# Patient Record
Sex: Female | Born: 1940
Health system: Southern US, Community
[De-identification: ages and names within clinical notes are randomized; demographics above are authoritative.]

## PROBLEM LIST (undated history)

## (undated) DIAGNOSIS — T7840XA Allergy, unspecified, initial encounter: Secondary | ICD-10-CM

## (undated) DIAGNOSIS — B019 Varicella without complication: Secondary | ICD-10-CM

## (undated) DIAGNOSIS — R32 Unspecified urinary incontinence: Secondary | ICD-10-CM

## (undated) DIAGNOSIS — E785 Hyperlipidemia, unspecified: Secondary | ICD-10-CM

## (undated) DIAGNOSIS — M1990 Unspecified osteoarthritis, unspecified site: Secondary | ICD-10-CM

## (undated) DIAGNOSIS — R112 Nausea with vomiting, unspecified: Secondary | ICD-10-CM

## (undated) DIAGNOSIS — I10 Essential (primary) hypertension: Secondary | ICD-10-CM

## (undated) DIAGNOSIS — Z9889 Other specified postprocedural states: Secondary | ICD-10-CM

## (undated) HISTORY — PX: TONSILLECTOMY AND ADENOIDECTOMY: SHX28

## (undated) HISTORY — DX: Unspecified urinary incontinence: R32

## (undated) HISTORY — PX: EYE SURGERY: SHX253

## (undated) HISTORY — PX: BREAST EXCISIONAL BIOPSY: SUR124

## (undated) HISTORY — PX: CATARACT EXTRACTION: SUR2

## (undated) HISTORY — DX: Hyperlipidemia, unspecified: E78.5

## (undated) HISTORY — DX: Unspecified osteoarthritis, unspecified site: M19.90

## (undated) HISTORY — DX: Varicella without complication: B01.9

## (undated) HISTORY — DX: Allergy, unspecified, initial encounter: T78.40XA

## (undated) HISTORY — PX: OOPHORECTOMY: SHX86

## (undated) HISTORY — DX: Essential (primary) hypertension: I10

---

## 1958-10-19 HISTORY — PX: TONSILLECTOMY AND ADENOIDECTOMY: SHX28

## 1966-10-19 HISTORY — PX: BREAST SURGERY: SHX581

## 1984-10-19 HISTORY — PX: ABDOMINAL HYSTERECTOMY: SHX81

## 2006-07-07 ENCOUNTER — Ambulatory Visit: Payer: Self-pay | Admitting: Internal Medicine

## 2006-07-07 IMAGING — US US CAROTID DUPLEX BILAT
1 series · 17 of 24 positions shown · non-contrast
Comparison: none

REASON FOR EXAM: Questionable TIA
COMMENTS:

[Series 1: us carotid duplex bilat · 17 of 62 slices shown]
[im 1/62]
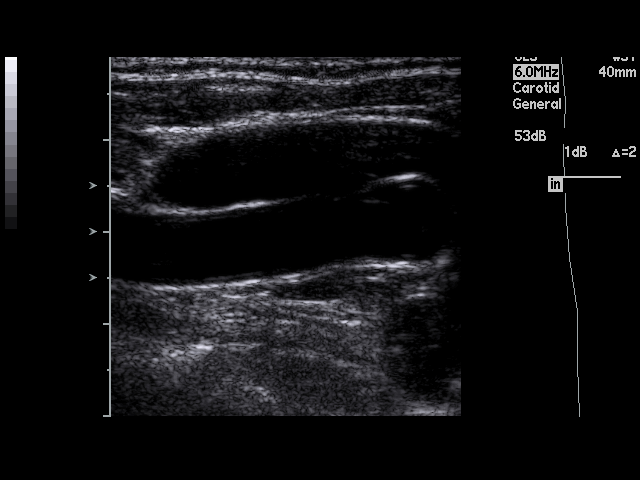
[im 6/62]
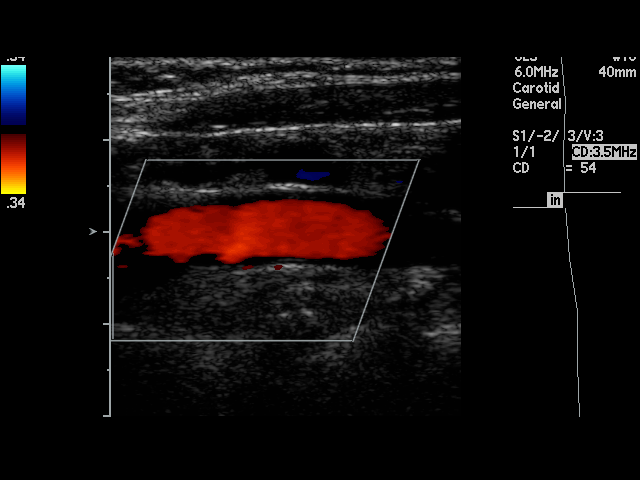
[im 8/62]
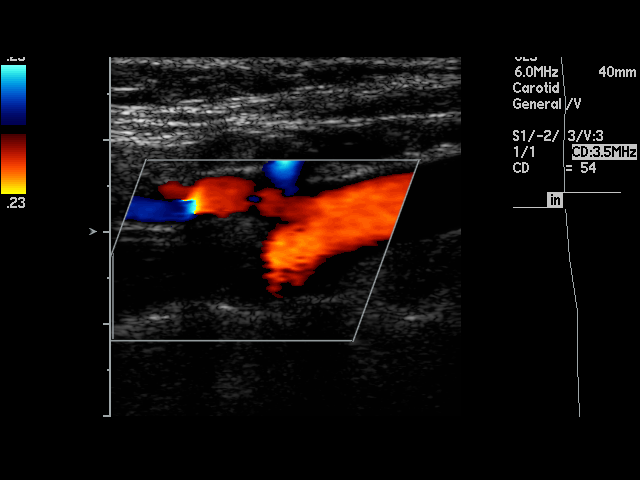
[im 11/62]
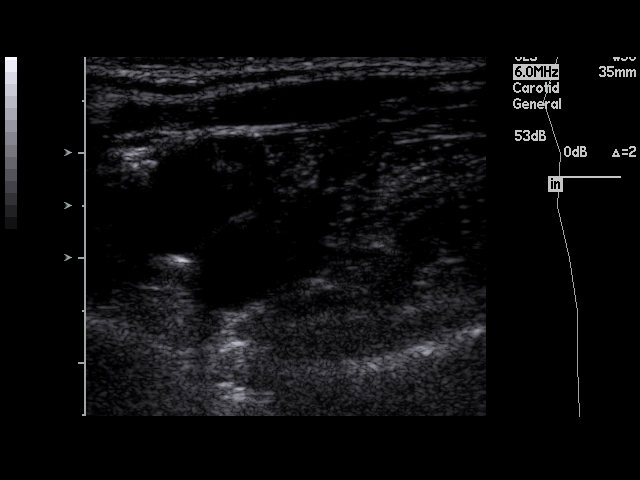
[im 16/62]
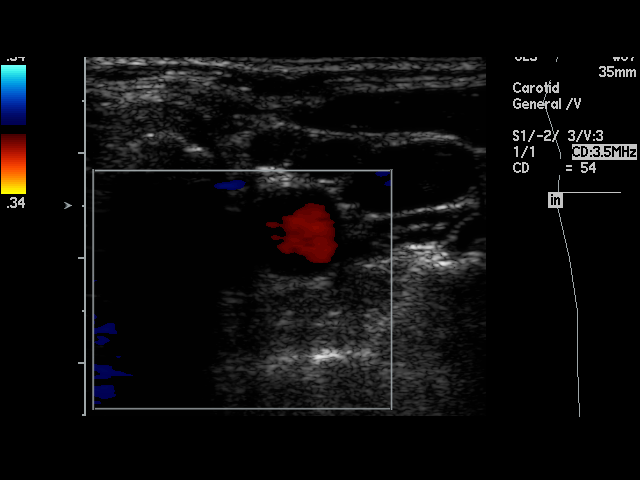
[im 19/62]
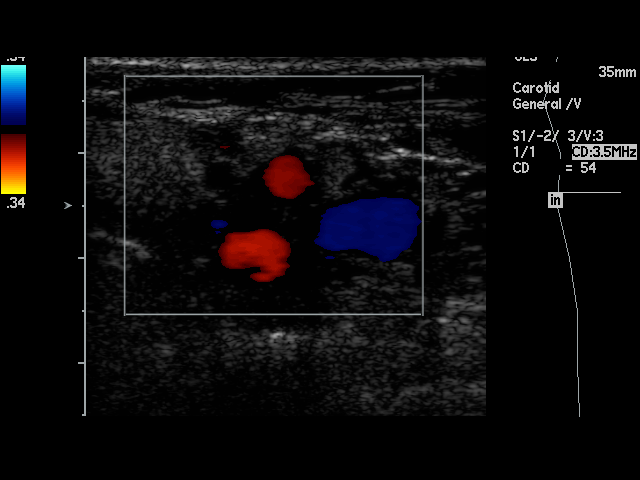
[im 24/62]
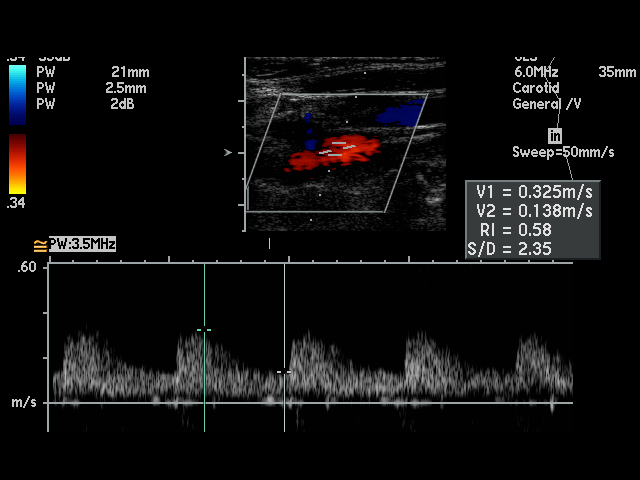
[im 27/62]
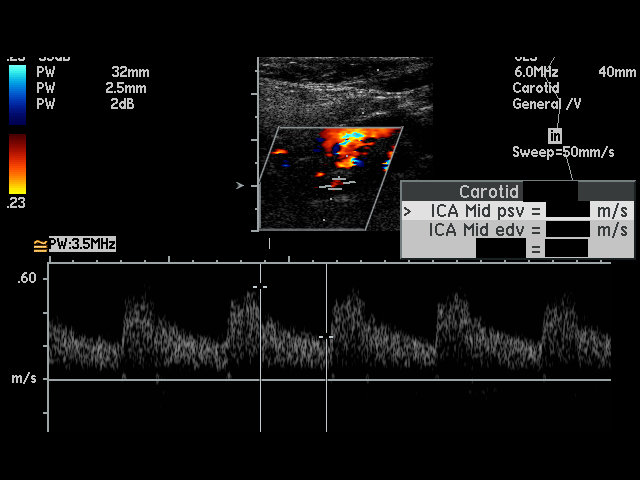
[im 32/62]
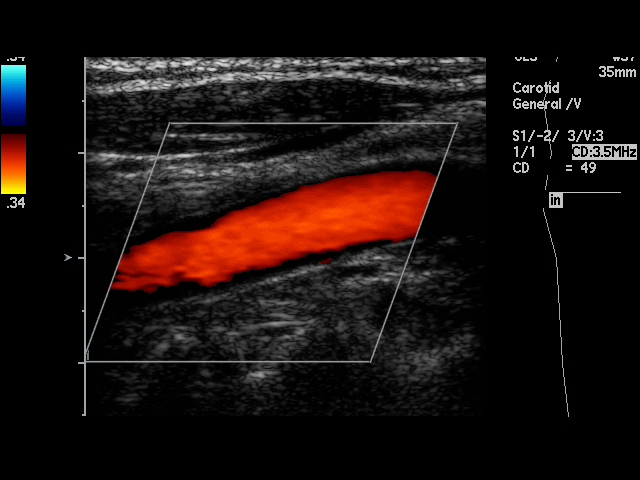
[im 35/62]
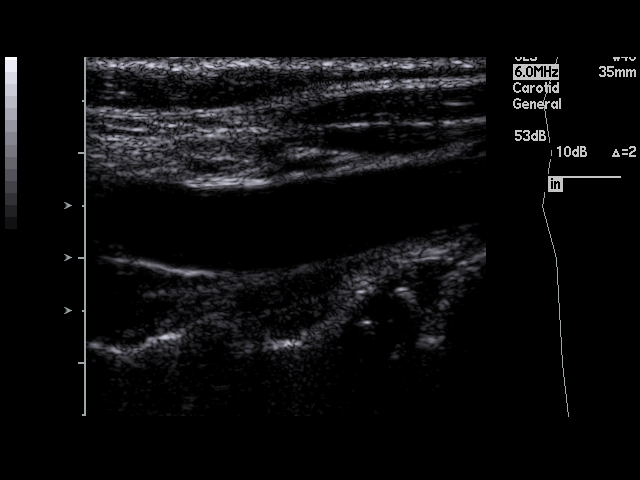
[im 38/62]
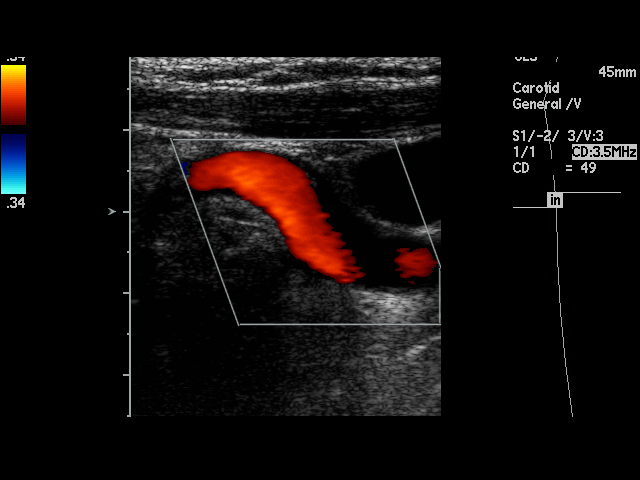
[im 43/62]
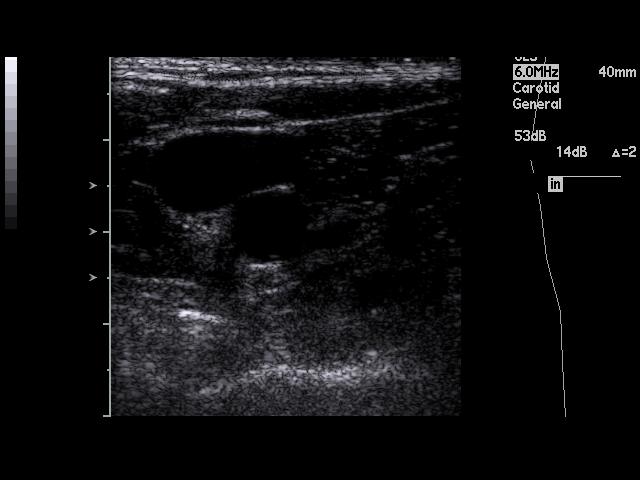
[im 46/62]
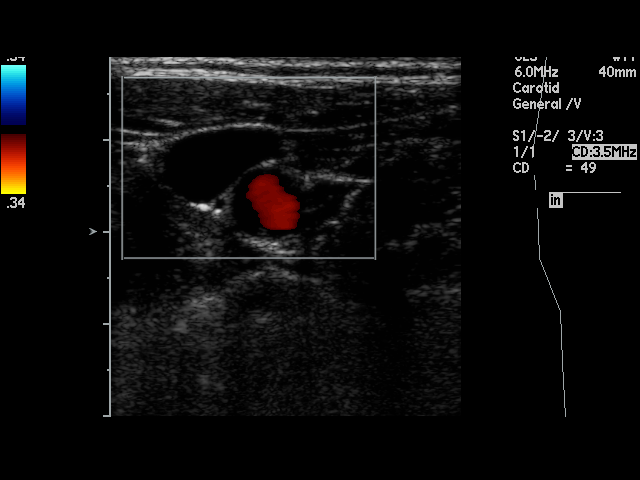
[im 51/62]
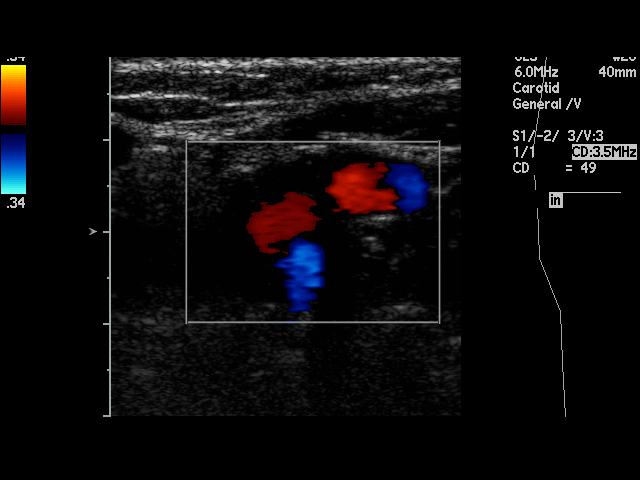
[im 54/62]
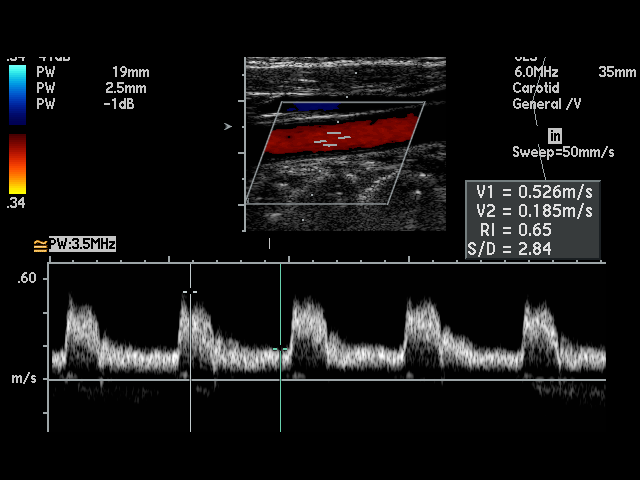
[im 56/62]
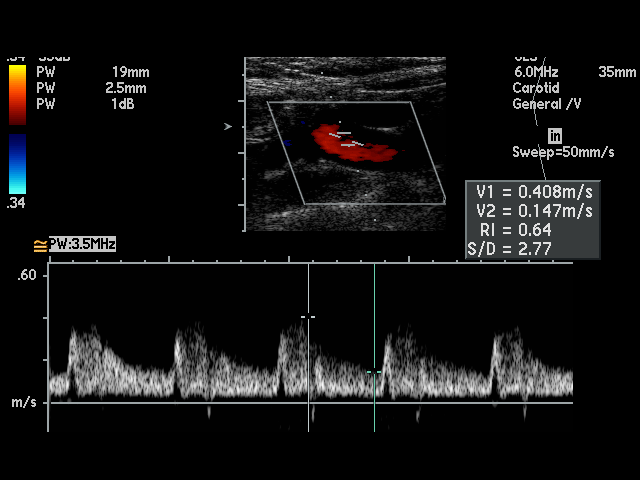
[im 62/62]
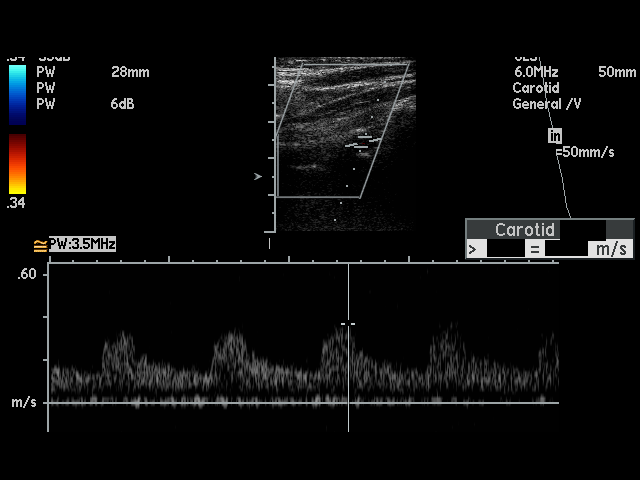

[17 of 24 positions shown; findings below may reference images not displayed]

PROCEDURE:     US  - US CAROTID DOPPLER BILATERAL  - [DATE] [DATE]

RESULT:     Gray scale, Duplex color flow and spectral waveform imaging was
performed of the RIGHT and LEFT carotid systems.

Visual evaluation of the RIGHT carotid system demonstrates no evidence of
intimal thickening, smooth or calcified plaque. The LEFT carotid system
demonstrates smooth intimal thickening of the carotid bulb demonstrating
less than 50% stenosis. The LEFT internal carotid artery demonstrates marked
tortuosity.

ICA:CCA ratios:

     RIGHT:
     LEFT:

Antegrade flow is demonstrated within the RIGHT and LEFT vertebral arteries.
Spectral waveform, color flow and Doppler evaluation is unremarkable within
the RIGHT and LEFT carotid systems.
IMPRESSION: No evidence of hemodynamically significant stenosis within
the RIGHT and LEFT carotid systems as described above.

## 2006-07-13 ENCOUNTER — Ambulatory Visit: Payer: Self-pay | Admitting: Unknown Physician Specialty

## 2006-12-21 ENCOUNTER — Ambulatory Visit: Payer: Self-pay | Admitting: Unknown Physician Specialty

## 2007-07-26 ENCOUNTER — Ambulatory Visit: Payer: Self-pay | Admitting: Unknown Physician Specialty

## 2008-07-26 ENCOUNTER — Ambulatory Visit: Payer: Self-pay | Admitting: Unknown Physician Specialty

## 2009-01-17 HISTORY — PX: FRACTURE SURGERY: SHX138

## 2009-01-31 ENCOUNTER — Emergency Department: Payer: Self-pay | Admitting: Emergency Medicine

## 2009-01-31 IMAGING — CR DG ANKLE 2V *L*
1 series · 2 of 2 positions shown · non-contrast
Comparison: none

REASON FOR EXAM: fall, pain and swelling in L ankle
COMMENTS:

[Series 1: view not recorded · 0.17mm/px · 2 of 2 slices shown]
[im 1/2]
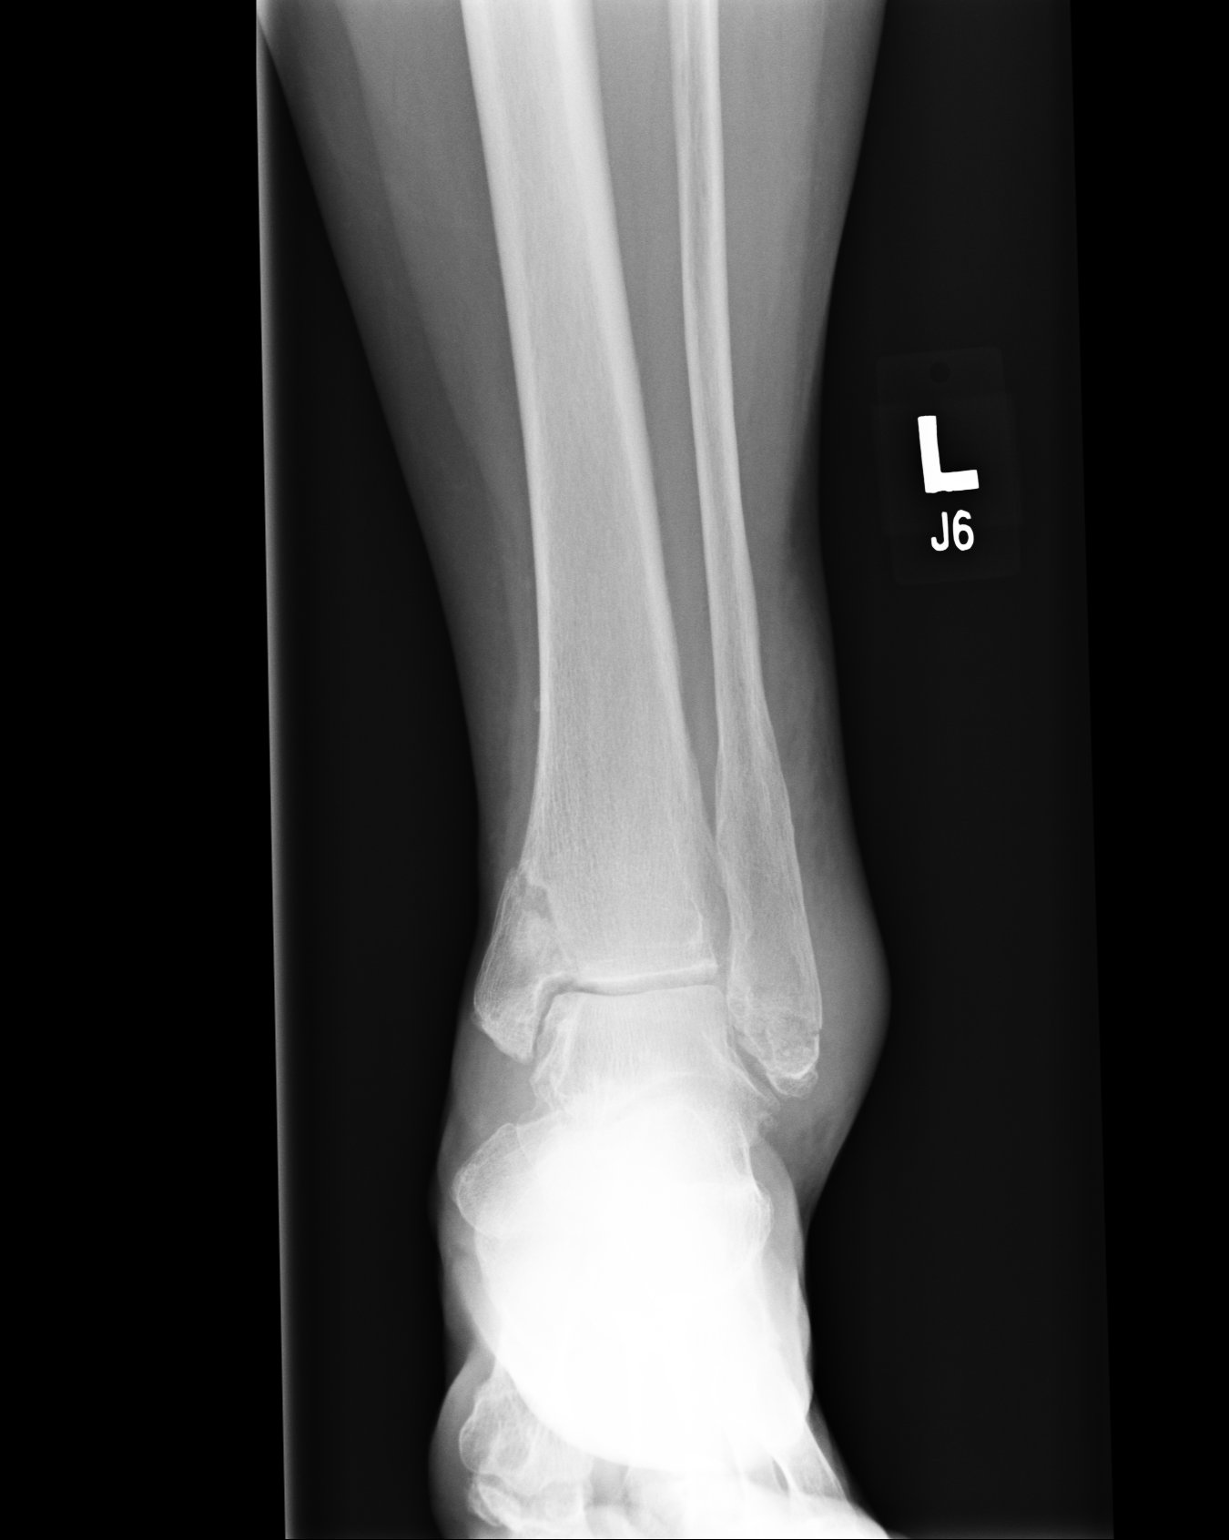
[im 2/2]
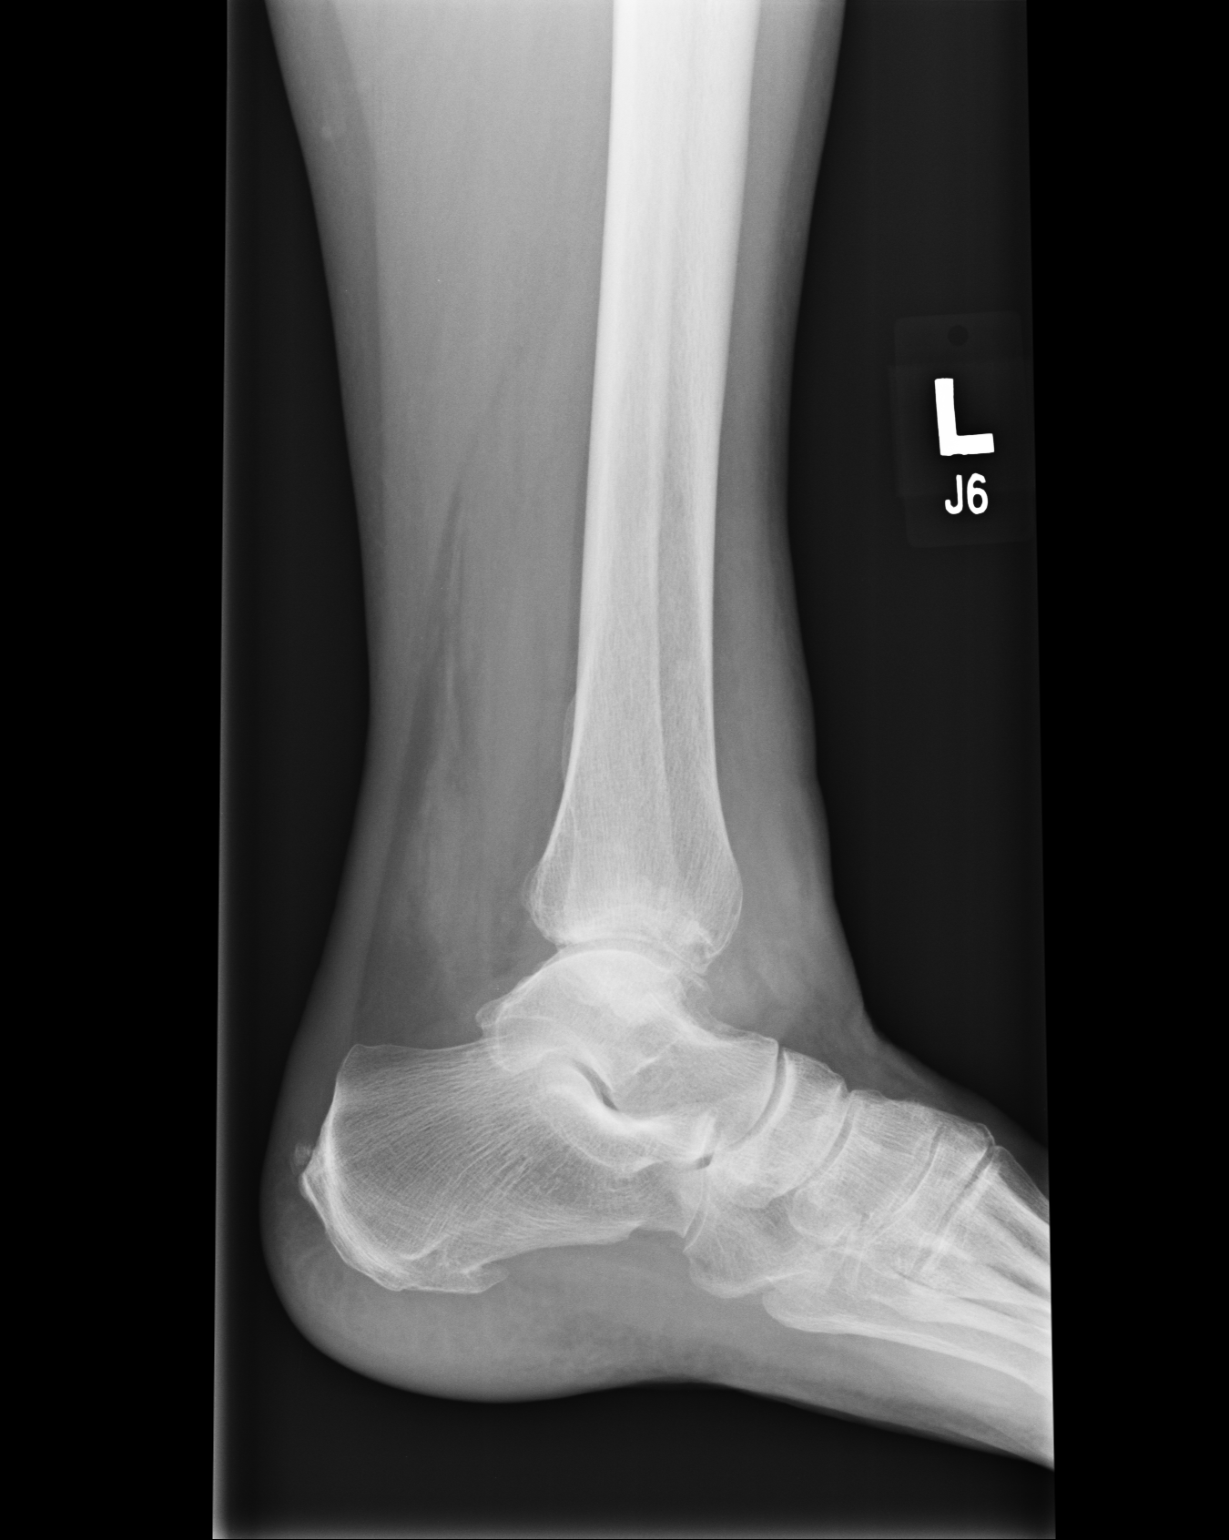

[2 of 2 positions shown; findings below may reference images not displayed]

PROCEDURE:     DXR - DXR ANKLE LEFT AP AND LATERAL  - [DATE]  [DATE]

RESULT:     There is a minimally displaced fracture of the medial malleolus.
Also noted is a transverse minimally displaced fracture of the inferior tip
of the lateral malleolus. No fracture of the posterior tibia is seen at this
time. The ankle mortise is well-maintained. There is marked soft tissue
swelling about the ankle. Incidental note is made of plantar and Achilles
calcaneal spurs.
IMPRESSION: 1.     There is a minimally displaced bimalleolar fracture as noted above.
2.     Plantar and Achilles calcaneal spurs are noted incidentally.

## 2009-02-08 ENCOUNTER — Ambulatory Visit: Payer: Self-pay | Admitting: General Practice

## 2011-01-29 ENCOUNTER — Ambulatory Visit: Payer: Self-pay | Admitting: Family Medicine

## 2012-01-25 LAB — HM COLONOSCOPY: HM Colonoscopy: NEGATIVE

## 2012-03-09 ENCOUNTER — Ambulatory Visit: Payer: Self-pay | Admitting: Unknown Physician Specialty

## 2012-03-09 LAB — HM COLONOSCOPY

## 2012-05-05 ENCOUNTER — Ambulatory Visit: Payer: Self-pay | Admitting: Family Medicine

## 2013-05-11 ENCOUNTER — Ambulatory Visit: Payer: Self-pay | Admitting: Family Medicine

## 2013-10-02 ENCOUNTER — Ambulatory Visit: Payer: Self-pay | Admitting: Ophthalmology

## 2013-10-09 ENCOUNTER — Ambulatory Visit: Payer: Self-pay | Admitting: Ophthalmology

## 2013-12-19 ENCOUNTER — Ambulatory Visit: Payer: Self-pay | Admitting: Ophthalmology

## 2014-10-24 DIAGNOSIS — N8110 Cystocele, unspecified: Secondary | ICD-10-CM | POA: Diagnosis not present

## 2014-10-24 DIAGNOSIS — N819 Female genital prolapse, unspecified: Secondary | ICD-10-CM | POA: Diagnosis not present

## 2015-02-09 NOTE — Op Note (Signed)
PATIENT NAME:  Jane Willis, Jane Willis MR#:  [id number] DATE OF BIRTH:  22-Dec-1940  DATE OF PROCEDURE:  10/09/2013  PREOPERATIVE DIAGNOSIS: Visually significant cataract of the left eye.   POSTOPERATIVE DIAGNOSIS: Visually significant cataract of the left eye.   OPERATIVE PROCEDURE: Cataract extraction by phacoemulsification with implant of intraocular lens to the left eye.   SURGEON: Birder Robson, MD.   ANESTHESIA:  1. Managed anesthesia care.  2. Topical tetracaine drops followed by 2% Xylocaine jelly applied in the preoperative holding area.   COMPLICATIONS: None.   TECHNIQUE:  Stop and chop.   DESCRIPTION OF PROCEDURE: The patient was examined and consented in the preoperative holding area where the aforementioned topical anesthesia was applied to the left eye and then brought back to the Operating Room where the left eye was prepped and draped in the usual sterile ophthalmic fashion and a lid speculum was placed. A paracentesis was created with the side port blade and the anterior chamber was filled with viscoelastic. A near clear corneal incision was performed with the steel keratome. A continuous curvilinear capsulorrhexis was performed with a cystotome followed by the capsulorrhexis forceps. Hydrodissection and hydrodelineation were carried out with BSS on a blunt cannula. The lens was removed in a stop and chop technique and the remaining cortical material was removed with the irrigation-aspiration handpiece. The capsular bag was inflated with viscoelastic and the Tecnis ZCB00 22.0-diopter lens, serial number [id number] was placed in the capsular bag without complication. The remaining viscoelastic was removed from the eye with the irrigation-aspiration handpiece. The wounds were hydrated. The anterior chamber was flushed with Miostat and the eye was inflated to physiologic pressure. 0.1 mL of cefuroxime concentration 10 mg/mL was placed in the anterior chamber. The wounds were found to be  water tight. The eye was dressed with Vigamox. The patient was given protective glasses to wear throughout the day and a shield with which to sleep tonight. The patient was also given drops with which to begin a drop regimen today and will follow-up with me in one day.   ____________________________ Livingston Diones. Aruna Nestler, MD wlp:gb D: 10/09/2013 17:02:03 ET T: 10/09/2013 23:26:53 ET JOB#: [id number]  cc: Rhylen Shaheen L. Izabella Marcantel, MD, <Dictator> Livingston Diones Kholton Coate MD ELECTRONICALLY SIGNED 10/26/2013 17:18

## 2015-02-09 NOTE — Op Note (Signed)
PATIENT NAME:  Jane Willis, Jane Willis MR#:  [id number] DATE OF BIRTH:  August 07, 1941  DATE OF PROCEDURE:  12/19/2013  PREOPERATIVE DIAGNOSIS: Visually significant cataract of the right eye.   POSTOPERATIVE DIAGNOSIS: Visually significant cataract of the right eye.   OPERATIVE PROCEDURE: Cataract extraction by phacoemulsification with implant of intraocular lens to right eye.   SURGEON: Birder Robson, MD.   ANESTHESIA:  1. Managed anesthesia care.  2. Topical tetracaine drops followed by 2% Xylocaine jelly applied in the preoperative holding area.   COMPLICATIONS: None.   TECHNIQUE:  Stop and chop.  DESCRIPTION OF PROCEDURE: The patient was examined and consented in the preoperative holding area where the aforementioned topical anesthesia was applied to the right eye and then brought back to the Operating Room where the right eye was prepped and draped in the usual sterile ophthalmic fashion and a lid speculum was placed. A paracentesis was created with the side port blade and the anterior chamber was filled with viscoelastic. A near clear corneal incision was performed with the steel keratome. A continuous curvilinear capsulorrhexis was performed with a cystotome followed by the capsulorrhexis forceps. Hydrodissection and hydrodelineation were carried out with BSS on a blunt cannula. The lens was removed in a stop and chop technique and the remaining cortical material was removed with the irrigation-aspiration handpiece. The capsular bag was inflated with viscoelastic and the Tecnis ZCB00 22.0-diopter lens, serial number [id number] was placed in the capsular bag without complication. The remaining viscoelastic was removed from the eye with the irrigation-aspiration handpiece. The wounds were hydrated. The anterior chamber was flushed with Miostat and the eye was inflated to physiologic pressure. 0.1 mL of cefuroxime concentration 10 mg/mL was placed in the anterior chamber. The wounds were found to be  water tight. The eye was dressed with Vigamox. The patient was given protective glasses to wear throughout the day and a shield with which to sleep tonight. The patient was also given drops with which to begin a drop regimen today and will follow-up with me in one day.     ____________________________ Livingston Diones. Adriona Kaney, MD wlp:ea D: 12/19/2013 20:01:56 ET T: 12/20/2013 07:16:26 ET JOB#: [id number]  cc: Leshea Jaggers L. Viren Lebeau, MD, <Dictator> Livingston Diones Mack Thurmon MD ELECTRONICALLY SIGNED 12/21/2013 9:39

## 2015-07-09 ENCOUNTER — Encounter: Payer: Self-pay | Admitting: Nurse Practitioner

## 2015-07-09 ENCOUNTER — Ambulatory Visit (INDEPENDENT_AMBULATORY_CARE_PROVIDER_SITE_OTHER): Payer: Medicare Other | Admitting: Nurse Practitioner

## 2015-07-09 VITALS — BP 116/82 | HR 57 | Temp 98.2°F | Resp 14 | Ht 63.0 in | Wt 146.8 lb

## 2015-07-09 DIAGNOSIS — Z1389 Encounter for screening for other disorder: Secondary | ICD-10-CM | POA: Diagnosis not present

## 2015-07-09 DIAGNOSIS — Z13220 Encounter for screening for lipoid disorders: Secondary | ICD-10-CM | POA: Diagnosis not present

## 2015-07-09 DIAGNOSIS — Z130 Encounter for screening for diseases of the blood and blood-forming organs and certain disorders involving the immune mechanism: Secondary | ICD-10-CM

## 2015-07-09 DIAGNOSIS — Z13820 Encounter for screening for osteoporosis: Secondary | ICD-10-CM | POA: Diagnosis not present

## 2015-07-09 DIAGNOSIS — Z7689 Persons encountering health services in other specified circumstances: Secondary | ICD-10-CM

## 2015-07-09 DIAGNOSIS — Z7189 Other specified counseling: Secondary | ICD-10-CM

## 2015-07-09 LAB — COMPREHENSIVE METABOLIC PANEL
ALK PHOS: 78 U/L (ref 39–117)
ALT: 11 U/L (ref 0–35)
AST: 13 U/L (ref 0–37)
Albumin: 4 g/dL (ref 3.5–5.2)
BUN: 16 mg/dL (ref 6–23)
CO2: 27 meq/L (ref 19–32)
Calcium: 9.4 mg/dL (ref 8.4–10.5)
Chloride: 104 mEq/L (ref 96–112)
Creatinine, Ser: 0.76 mg/dL (ref 0.40–1.20)
GFR: 78.63 mL/min (ref >60.00)
GLUCOSE: 85 mg/dL (ref 70–99)
POTASSIUM: 4.3 meq/L (ref 3.5–5.1)
Sodium: 139 mEq/L (ref 135–145)
TOTAL PROTEIN: 6.7 g/dL (ref 6.0–8.3)
Total Bilirubin: 0.5 mg/dL (ref 0.2–1.2)

## 2015-07-09 LAB — LIPID PANEL
Cholesterol: 255 mg/dL — ABNORMAL HIGH (ref 0–200)
HDL: 55 mg/dL (ref >39.00)
LDL Cholesterol: 170 mg/dL — ABNORMAL HIGH (ref 0–99)
NONHDL: 199.85
Total CHOL/HDL Ratio: 5
Triglycerides: 151 mg/dL — ABNORMAL HIGH (ref 0.0–149.0)
VLDL: 30.2 mg/dL (ref 0.0–40.0)

## 2015-07-09 LAB — CBC WITH DIFFERENTIAL/PLATELET
BASOS PCT: 0.7 % (ref 0.0–3.0)
Basophils Absolute: 0 10*3/uL (ref 0.0–0.1)
EOS ABS: 0.2 10*3/uL (ref 0.0–0.7)
Eosinophils Relative: 4.2 % (ref 0.0–5.0)
HEMATOCRIT: 40.4 % (ref 36.0–46.0)
HEMOGLOBIN: 13.5 g/dL (ref 12.0–15.0)
LYMPHS PCT: 33.4 % (ref 12.0–46.0)
Lymphs Abs: 1.8 10*3/uL (ref 0.7–4.0)
MCHC: 33.3 g/dL (ref 30.0–36.0)
MCV: 89.9 fl (ref 78.0–100.0)
MONOS PCT: 7 % (ref 3.0–12.0)
Monocytes Absolute: 0.4 10*3/uL (ref 0.1–1.0)
Neutro Abs: 2.9 10*3/uL (ref 1.4–7.7)
Neutrophils Relative %: 54.7 % (ref 43.0–77.0)
Platelets: 248 10*3/uL (ref 150.0–400.0)
RBC: 4.49 Mil/uL (ref 3.87–5.11)
RDW: 14 % (ref 11.5–15.5)
WBC: 5.3 10*3/uL (ref 4.0–10.5)

## 2015-07-09 LAB — VITAMIN D 25 HYDROXY (VIT D DEFICIENCY, FRACTURES): VITD: 33.48 ng/mL (ref 30.00–100.00)

## 2015-07-09 NOTE — Progress Notes (Signed)
Patient ID: Jane Willis, female    DOB: [birth year]  Age: 74 y.o. MRN: [id number]  CC: Establish Care   HPI MAUREEN DELATTE presents for establishing care and CC of need for labs.   1) New pt info:   Immunizations- tdap 2016, pna 2013   Mammogram- N/A  Pap- Hysterectomy 1986  Bone Density- 11/2008   Colonoscopy- 2013, Dr. Vira Agar wants to see her in 5 yr  Eye Exam- 07/2014 [facility]  Dental Exam- Implants, UTD  LMP- 1986  2) Chronic Problems-  Allergies- OTC measures helpful  Arthritis-   Hyperlipidemia-   HTN-  3) Acute Problems-  Needs labs   History Evalisse has a past medical history of Arthritis; Chicken pox; Allergy; Hyperlipidemia; Hypertension; and Urinary incontinence.   She has past surgical history that includes Breast surgery (1968); Tonsillectomy and adenoidectomy; Abdominal hysterectomy (1986); Fracture surgery (01/2009); and Cataract extraction (2014/2015).   Her family history includes Arthritis in her father, maternal grandmother, and mother; Cancer in her brother and maternal aunt; Hypertension in her father and mother.She reports that she has never smoked. She has never used smokeless tobacco. She reports that she does not drink alcohol or use illicit drugs.  No outpatient prescriptions prior to visit.   No facility-administered medications prior to visit.    ROS Review of Systems  Constitutional: Negative for fever, chills, diaphoresis and fatigue.  Respiratory: Negative for chest tightness, shortness of breath and wheezing.   Cardiovascular: Negative for chest pain, palpitations and leg swelling.  Gastrointestinal: Negative for nausea, vomiting and diarrhea.  Skin: Negative for rash.  Neurological: Negative for dizziness, weakness, numbness and headaches.  Psychiatric/Behavioral: The patient is not nervous/anxious.     Objective:  BP 116/82 mmHg  Pulse 57  Temp(Src) 98.2 F (36.8 C)  Resp 14  Ht 5\' 3"  (1.6 m)  Wt 146 lb 12.8  oz (66.588 kg)  BMI 26.01 kg/m2  SpO2 97%  Physical Exam  Constitutional: She is oriented to person, place, and time. She appears well-developed and well-nourished. No distress.  HENT:  Head: Normocephalic and atraumatic.  Right Ear: External ear normal.  Left Ear: External ear normal.  Cardiovascular: Normal rate, regular rhythm and normal heart sounds.  Exam reveals no gallop and no friction rub.   No murmur heard. Pulmonary/Chest: Effort normal and breath sounds normal. No respiratory distress. She has no wheezes. She has no rales. She exhibits no tenderness.  Neurological: She is alert and oriented to person, place, and time. No cranial nerve deficit. She exhibits normal muscle tone. Coordination normal.  Skin: Skin is warm and dry. No rash noted. She is not diaphoretic.  Psychiatric: She has a normal mood and affect. Her behavior is normal. Judgment and thought content normal.   Assessment & Plan:   Adara was seen today for establish care.  Diagnoses and all orders for this visit:  Screening for osteoporosis -     DG Bone Density; Future  I am having Ms. Limburg maintain her aspirin, vitamin C, Fish Oil, and Vitamin D-3.  Meds ordered this encounter  Medications  . aspirin 81 MG tablet    Sig: Take 81 mg by mouth daily.  . vitamin C (ASCORBIC ACID) 500 MG tablet    Sig: Take 500 mg by mouth daily.  . Omega-3 Fatty Acids (FISH OIL) 1360 MG CAPS    Sig: Take by mouth.  . Cholecalciferol (VITAMIN D-3) 1000 UNITS CAPS    Sig: Take 1,000 capsules by  mouth 2 (two) times daily.     Follow-up: No Follow-up on file.

## 2015-07-09 NOTE — Patient Instructions (Signed)
Welcome to [facility]! Nice to meet you!   Follow up in 1 year.

## 2015-07-12 DIAGNOSIS — Z130 Encounter for screening for diseases of the blood and blood-forming organs and certain disorders involving the immune mechanism: Secondary | ICD-10-CM | POA: Insufficient documentation

## 2015-07-12 DIAGNOSIS — Z13820 Encounter for screening for osteoporosis: Secondary | ICD-10-CM | POA: Insufficient documentation

## 2015-07-12 DIAGNOSIS — Z13220 Encounter for screening for lipoid disorders: Secondary | ICD-10-CM | POA: Insufficient documentation

## 2015-07-12 DIAGNOSIS — Z7689 Persons encountering health services in other specified circumstances: Secondary | ICD-10-CM | POA: Insufficient documentation

## 2015-07-12 DIAGNOSIS — Z1389 Encounter for screening for other disorder: Secondary | ICD-10-CM | POA: Insufficient documentation

## 2015-07-12 NOTE — Assessment & Plan Note (Signed)
Discussed acute and chronic issues. Reviewed health maintenance measures, PFSHx, and immunizations. Obtain records from previous facility.   Lipid panel, CMET, and CBC w/ diff ordered

## 2015-07-12 NOTE — Assessment & Plan Note (Signed)
Bone density placed to screen for osteoporosis and Vitamin D level.

## 2015-12-12 ENCOUNTER — Ambulatory Visit: Payer: Medicare Other | Attending: Nurse Practitioner

## 2016-02-19 DIAGNOSIS — S0101XA Laceration without foreign body of scalp, initial encounter: Secondary | ICD-10-CM | POA: Diagnosis not present

## 2016-02-21 ENCOUNTER — Encounter: Payer: Self-pay | Admitting: Family Medicine

## 2016-02-21 ENCOUNTER — Ambulatory Visit (INDEPENDENT_AMBULATORY_CARE_PROVIDER_SITE_OTHER): Payer: Medicare Other | Admitting: Family Medicine

## 2016-02-21 ENCOUNTER — Encounter (INDEPENDENT_AMBULATORY_CARE_PROVIDER_SITE_OTHER): Payer: Self-pay

## 2016-02-21 VITALS — BP 136/88 | HR 65 | Temp 98.3°F | Ht 63.0 in | Wt 147.0 lb

## 2016-02-21 DIAGNOSIS — S0101XA Laceration without foreign body of scalp, initial encounter: Secondary | ICD-10-CM | POA: Diagnosis not present

## 2016-02-21 NOTE — Progress Notes (Signed)
Patient ID: CHEYANN GIPP, female   DOB: 12-Jan-1941, 75 y.o.   MRN: [id number]  [name], MD Phone: [phone]  CAMYAH DERDEN is a 75 y.o. female who presents today for follow-up.  Patient notes on Wednesday she was unloading a lawnmower from a truck and slipped and wet grass and fell on her bottom. She notes she her head hit a basketball goalpost. She had some bleeding from her head. She had no loss of consciousness. She was evaluated at an urgent care and had 5 staples placed in her head. She's not had any headaches. No numbness or weakness or vision changes. No dizziness. She feels well overall. She's not had any discomfort in her back or bottom. She's not on any anticoagulation. She is up-to-date on her tetanus shot. She is to go to the urgent care next Wednesday for staple removal.  ROS see history of present illness  Objective  Physical Exam Filed Vitals:   02/21/16 1104  BP: 136/88  Pulse: 65  Temp: 98.3 F (36.8 C)    BP Readings from Last 3 Encounters:  02/21/16 136/88  07/09/15 116/82   Wt Readings from Last 3 Encounters:  02/21/16 147 lb (66.679 kg)  07/09/15 146 lb 12.8 oz (66.588 kg)    Physical Exam  Constitutional: She is well-developed, well-nourished, and in no distress.  HENT:  Head: Normocephalic.    Right Ear: External ear normal.  Left Ear: External ear normal.  Mouth/Throat: Oropharynx is clear and moist. No oropharyngeal exudate.  Eyes: Conjunctivae are normal. Pupils are equal, round, and reactive to light.  Neck: Neck supple.  Cardiovascular: Normal rate, regular rhythm and normal heart sounds.   Pulmonary/Chest: Effort normal and breath sounds normal.  Musculoskeletal: She exhibits no edema.  No midline spine tenderness, no midline spine step-off, no muscular back tenderness, no hip tenderness bilaterally  Lymphadenopathy:    She has no cervical adenopathy.  Neurological: She is alert.  CN 2-12 intact, 5/5 strength in bilateral  biceps, triceps, grip, quads, hamstrings, plantar and dorsiflexion, sensation to light touch intact in bilateral UE and LE, normal gait, 2+ patellar reflexes  Skin: Skin is warm and dry.     Assessment/Plan: Please see individual problem list.  Scalp laceration Patient status post mechanical fall slipping on grass. Suffered a laceration to her scalp. Was evaluated at urgent care. Had staples placed. No loss of consciousness. She is neurologically intact. Laceration appears to be well-healing. She will continue to monitor. She will return to urgent care for staple removal. She's given return precautions.    [name], MD [place]

## 2016-02-21 NOTE — Progress Notes (Signed)
Pre visit review using our clinic review tool, if applicable. No additional management support is needed unless otherwise documented below in the visit note. 

## 2016-02-21 NOTE — Assessment & Plan Note (Signed)
Patient status post mechanical fall slipping on grass. Suffered a laceration to her scalp. Was evaluated at urgent care. Had staples placed. No loss of consciousness. She is neurologically intact. Laceration appears to be well-healing. She will continue to monitor. She will return to urgent care for staple removal. She's given return precautions.

## 2016-02-21 NOTE — Patient Instructions (Signed)
Nice to meet you. Please continue to monitor your head and follow-up with [facility] to have her staples removed next week. Please schedule an appointment for a physical at her convenience. If you develop headaches, vision changes, numbness, weakness, bleeding, or any new or changing symptoms please seek medical attention.

## 2016-04-02 ENCOUNTER — Ambulatory Visit (INDEPENDENT_AMBULATORY_CARE_PROVIDER_SITE_OTHER): Payer: Medicare Other | Admitting: Family Medicine

## 2016-04-02 ENCOUNTER — Encounter: Payer: Self-pay | Admitting: Family Medicine

## 2016-04-02 VITALS — BP 126/78 | HR 66 | Temp 98.4°F | Ht 63.0 in | Wt 144.2 lb

## 2016-04-02 DIAGNOSIS — R32 Unspecified urinary incontinence: Secondary | ICD-10-CM | POA: Diagnosis not present

## 2016-04-02 DIAGNOSIS — E663 Overweight: Secondary | ICD-10-CM

## 2016-04-02 DIAGNOSIS — Z13220 Encounter for screening for lipoid disorders: Secondary | ICD-10-CM

## 2016-04-02 DIAGNOSIS — Z0001 Encounter for general adult medical examination with abnormal findings: Secondary | ICD-10-CM | POA: Diagnosis not present

## 2016-04-02 DIAGNOSIS — R200 Anesthesia of skin: Secondary | ICD-10-CM | POA: Insufficient documentation

## 2016-04-02 DIAGNOSIS — R208 Other disturbances of skin sensation: Secondary | ICD-10-CM

## 2016-04-02 LAB — COMPREHENSIVE METABOLIC PANEL
ALBUMIN: 3.9 g/dL (ref 3.5–5.2)
ALK PHOS: 73 U/L (ref 39–117)
ALT: 10 U/L (ref 0–35)
AST: 16 U/L (ref 0–37)
BUN: 11 mg/dL (ref 6–23)
CALCIUM: 9.3 mg/dL (ref 8.4–10.5)
CO2: 29 mEq/L (ref 19–32)
Chloride: 104 mEq/L (ref 96–112)
Creatinine, Ser: 0.76 mg/dL (ref 0.40–1.20)
GFR: 78.47 mL/min (ref >60.00)
GLUCOSE: 96 mg/dL (ref 70–99)
Potassium: 4 mEq/L (ref 3.5–5.1)
Sodium: 137 mEq/L (ref 135–145)
Total Bilirubin: 0.5 mg/dL (ref 0.2–1.2)
Total Protein: 6.9 g/dL (ref 6.0–8.3)

## 2016-04-02 LAB — LIPID PANEL
CHOLESTEROL: 259 mg/dL — AB (ref 0–200)
HDL: 55.9 mg/dL (ref >39.00)
LDL Cholesterol: 180 mg/dL — ABNORMAL HIGH (ref 0–99)
NonHDL: 203.53
TRIGLYCERIDES: 120 mg/dL (ref 0.0–149.0)
Total CHOL/HDL Ratio: 5
VLDL: 24 mg/dL (ref 0.0–40.0)

## 2016-04-02 LAB — TSH: TSH: 1.33 u[IU]/mL (ref 0.35–4.50)

## 2016-04-02 LAB — HEMOGLOBIN A1C: Hgb A1c MFr Bld: 5.7 % (ref 4.6–6.5)

## 2016-04-02 LAB — VITAMIN B12: VITAMIN B 12: 196 pg/mL — AB (ref 211–911)

## 2016-04-02 NOTE — Progress Notes (Signed)
Pre visit review using our clinic review tool, if applicable. No additional management support is needed unless otherwise documented below in the visit note. 

## 2016-04-02 NOTE — Assessment & Plan Note (Signed)
Related to bladder prolapse. Wears a pessary with good results. Followed by gynecology.

## 2016-04-02 NOTE — Assessment & Plan Note (Addendum)
Overall doing well. BMI is very slightly in the overweight category. Diet is quite good. Encouraged to continue this. Encouraged exercise. Discussed screening tests. She declined DEXA scan. She declined mammogram. We will discuss colon cancer screening at her yearly follow-up next year. No need for Pap smear given prior hysterectomy for benign reasons. Lab work as outlined below.

## 2016-04-02 NOTE — Assessment & Plan Note (Signed)
Bilateral finger numbness for the last year or so. Otherwise neurologically intact. Could be neuropathy. Doubt central lesion given bilateral nature and limited fingertips. Discussed workup with lab work to start and then potentially seeing neurology. We'll order lab work as outlined below. She'll continue to monitor. Given return precautions.

## 2016-04-02 NOTE — Patient Instructions (Addendum)
Nice to see you. You're doing great job with her diet. Please start to exercise by walking 20-30 minutes 5 days a week as tolerated. Continue to monitor your fingertips. We will check some lab work to evaluate for a potential cause. We will request records from her prior physician's office to see what his pneumonia vaccine you've gotten. If you develop numbness, weakness, vision changes, or new or changing symptoms please seek medical attention.

## 2016-04-02 NOTE — Progress Notes (Signed)
Patient ID: Jane Willis, female   DOB: 02/28/1941, 75 y.o.   MRN: [id number]  Jane Rumps, MD Phone: [phone]  Jane Willis is a 75 y.o. female who presents today for physical exam.  Patient notes overall she is doing quite well. She does note some numbness in her bilateral fingertips. It has been going on for at least the past year. No numbness elsewhere. No weakness. No vision changes. Notes it is mildly tingly. Doesn't quite have the dexterity she used 2 given this. It is not absent of sensation though is decreased.  Diet is described as very good. Only one yogurt and fruit of breakfast. Crackers for lunch. Dinner includes vegetable and meat. Drinks unsweet tea. No soda. Does not exercise. No recent DEXA scan. Declines this. Mammogram several years ago. Declines further mammography. She will be due for colonoscopy next year. Reports she is unsure if she would want to do this or she would like to do stool cards. Had a hysterectomy for noncancerous reasons. No recent Pap smears. Tetanus is up-to-date. Zostavax is up-to-date. Unsure if pneumonia vaccine is up-to-date as we do not have the type of vaccine she most recently received. No alcohol use. No tobacco use. No illicit drug use.  Active Ambulatory Problems    Diagnosis Date Noted  . Screening for osteoporosis 07/12/2015  . Screening for lipid disorders 07/12/2015  . Screening for nephropathy 07/12/2015  . Screening for deficiency anemia 07/12/2015  . Encounter to establish care 07/12/2015  . Scalp laceration 02/21/2016  . Encounter for general adult medical examination with abnormal findings 04/02/2016  . Bilateral finger numbness 04/02/2016  . Urinary incontinence 04/02/2016   Resolved Ambulatory Problems    Diagnosis Date Noted  . No Resolved Ambulatory Problems   Past Medical History  Diagnosis Date  . Arthritis   . Chicken pox   . Allergy   . Hyperlipidemia   . Hypertension     Family History    Problem Relation Age of Onset  . Cancer Brother     Colon  . Cancer Maternal Aunt     Breast Cancer  . Arthritis Mother   . Hypertension Mother   . Arthritis Father   . Hypertension Father   . Arthritis Maternal Grandmother     Social History   Social History  . Marital Status: Married    Spouse Name: N/A  . Number of Children: N/A  . Years of Education: N/A   Occupational History  . Not on file.   Social History Main Topics  . Smoking status: Never Smoker   . Smokeless tobacco: Never Used  . Alcohol Use: No  . Drug Use: No  . Sexual Activity: No   Other Topics Concern  . Not on file   Social History Narrative   Married   Retired   Children 3   Pets: none   Caffeine- Coffee, Tea, rare soda    ROS  General:  Negative for nexplained weight loss, fever Skin: Negative for new or changing mole, sore that won't heal HEENT: Negative for trouble hearing, trouble seeing, ringing in ears, mouth sores, hoarseness, change in voice, dysphagia. CV:  Negative for chest pain, dyspnea, edema, palpitations Resp: Negative for cough, dyspnea, hemoptysis GI: Negative for nausea, vomiting, diarrhea, constipation, abdominal pain, melena, hematochezia. GU: Positive for incontinence (followed by gynecology and wears a pessary. Notes the pessary is significantly beneficial.) Negative for dysuria, urinary hesitance, hematuria, vaginal or penile discharge, polyuria, sexual difficulty, lumps in testicle  or breasts MSK: Negative for muscle cramps or aches, joint pain or swelling Neuro: Positive for numbness in fingertips, Negative for headaches, weakness, dizziness, passing out/fainting Psych: Negative for depression, anxiety, memory problems  Objective  Physical Exam Filed Vitals:   04/02/16 0842  BP: 126/78  Pulse: 66  Temp: 98.4 F (36.9 C)    BP Readings from Last 3 Encounters:  04/02/16 126/78  02/21/16 136/88  07/09/15 116/82   Wt Readings from Last 3 Encounters:   04/02/16 144 lb 3.2 oz (65.409 kg)  02/21/16 147 lb (66.679 kg)  07/09/15 146 lb 12.8 oz (66.588 kg)    Physical Exam  Constitutional: She is well-developed, well-nourished, and in no distress.  HENT:  Head: Normocephalic and atraumatic.  Right Ear: External ear normal.  Left Ear: External ear normal.  Mouth/Throat: Oropharynx is clear and moist. No oropharyngeal exudate.  Eyes: Conjunctivae are normal. Pupils are equal, round, and reactive to light.  Neck: Neck supple.  Cardiovascular: Normal rate, regular rhythm and normal heart sounds.   Pulmonary/Chest: Effort normal and breath sounds normal.  Bilateral breasts with no skin changes or lesions palpated  Abdominal: Soft. Bowel sounds are normal. She exhibits no distension. There is no tenderness. There is no rebound and no guarding.  Musculoskeletal: She exhibits no edema.  Lymphadenopathy:    She has no cervical adenopathy.  Neurological: She is alert.  CN 2-12 intact, 5/5 strength in bilateral biceps, triceps, grip, quads, hamstrings, plantar and dorsiflexion, mild decreased sensation light touch in bilateral fingertips, otherwise sensation to light touch intact in bilateral UE and LE, normal gait, 2+ patellar reflexes  Skin: Skin is warm and dry. She is not diaphoretic.  Psychiatric: Mood and affect normal.     Assessment/Plan:   Encounter for general adult medical examination with abnormal findings Overall doing well. BMI is very slightly in the overweight category. Diet is quite good. Encouraged to continue this. Encouraged exercise. Discussed screening tests. She declined DEXA scan. She declined mammogram. We will discuss colon cancer screening at her yearly follow-up next year. No need for Pap smear given prior hysterectomy for benign reasons. Lab work as outlined below.  Bilateral finger numbness Bilateral finger numbness for the last year or so. Otherwise neurologically intact. Could be neuropathy. Doubt central lesion  given bilateral nature and limited fingertips. Discussed workup with lab work to start and then potentially seeing neurology. We'll order lab work as outlined below. She'll continue to monitor. Given return precautions.  Urinary incontinence Related to bladder prolapse. Wears a pessary with good results. Followed by gynecology.    Orders Placed This Encounter  Procedures  . Lipid Profile  . Comp Met (CMET)  . HgB A1c  . B12  . TSH    Jane Rumps, MD [place]

## 2016-07-03 ENCOUNTER — Ambulatory Visit (INDEPENDENT_AMBULATORY_CARE_PROVIDER_SITE_OTHER): Payer: Medicare Other | Admitting: Family Medicine

## 2016-07-03 ENCOUNTER — Encounter: Payer: Self-pay | Admitting: Family Medicine

## 2016-07-03 VITALS — BP 126/84 | HR 68 | Temp 97.9°F | Ht 63.0 in | Wt 143.0 lb

## 2016-07-03 DIAGNOSIS — Z13820 Encounter for screening for osteoporosis: Secondary | ICD-10-CM | POA: Diagnosis not present

## 2016-07-03 DIAGNOSIS — Z8781 Personal history of (healed) traumatic fracture: Secondary | ICD-10-CM

## 2016-07-03 DIAGNOSIS — E785 Hyperlipidemia, unspecified: Secondary | ICD-10-CM

## 2016-07-03 DIAGNOSIS — J069 Acute upper respiratory infection, unspecified: Secondary | ICD-10-CM | POA: Diagnosis not present

## 2016-07-03 NOTE — Patient Instructions (Signed)
Nice to see you. Please start monitoring your diet and make some changes based on the form that has been provided. We will get you set up for a bone density scan and call you with the results. Please continue stay active. If your cold symptoms returned please let us know.

## 2016-07-03 NOTE — Assessment & Plan Note (Signed)
Patient refuses medicine at this time. I discussed increasing exercise. Also gave information on cholesterol diet. She'll continue fish oil and baby aspirin.

## 2016-07-03 NOTE — Progress Notes (Signed)
  [name], MD Phone: [phone]  Jane Willis is a 75 y.o. female who presents today for follow-up.  HYPERLIPIDEMIA Symptoms Chest pain on exertion:  No   Leg claudication:   No Not currently taking any medications. Has not made any diet changes. She's not increased her exercise though she does stay active throughout the day. Using fish oil and taking a baby aspirin.  History of low impact fracture: Patient notes previously having osteoporosis screening in 2010. Notes she was advised that everything was good. Several months after this she had a low impact fracture of her ankle though has not had any ankle issues since then. She reports only twisted her ankle and ended up with a plate and several screws. Takes vitamin D though no calcium. No DEXA scan since then.  Patient also notes she is just getting over a cold. Notes mild dry cough now. Was congested and had some soreness with a cough though no chest pain or shortness of breath. Notes her nose is now clear. Feels like she is improving.  PMH: nonsmoker.   ROS see history of present illness  Objective  Physical Exam Vitals:   07/03/16 0838  BP: 126/84  Pulse: 68  Temp: 97.9 F (36.6 C)    BP Readings from Last 3 Encounters:  07/03/16 126/84  04/02/16 126/78  02/21/16 136/88   Wt Readings from Last 3 Encounters:  07/03/16 143 lb (64.9 kg)  04/02/16 144 lb 3.2 oz (65.4 kg)  02/21/16 147 lb (66.7 kg)    Physical Exam  Constitutional: No distress.  HENT:  Head: Normocephalic and atraumatic.  Mouth/Throat: No oropharyngeal exudate.  Normal TMs bilaterally, mild erythema of the posterior oropharynx  Eyes: Conjunctivae are normal. Pupils are equal, round, and reactive to light.  Cardiovascular: Normal rate, regular rhythm and normal heart sounds.   2+ DP pulses  Pulmonary/Chest: Effort normal and breath sounds normal.  Musculoskeletal: She exhibits no edema.  Neurological: She is alert. Gait normal.    Skin: Skin is warm and dry. She is not diaphoretic.     Assessment/Plan: Please see individual problem list.  Hyperlipidemia Patient refuses medicine at this time. I discussed increasing exercise. Also gave information on cholesterol diet. She'll continue fish oil and baby aspirin.  History of low impact fracture of ankle Patient with a history of low impact fracture of her left ankle. No recent DEXA scan. Discussed that this would be concerning for osteoporosis. Has been many years since this occurred. She'll continue vitamin D. I encouraged calcium intake as well. We'll check a DEXA scan.  URI (upper respiratory infection) Patient with recent upper respiratory infection symptoms. Likely viral as she has improved at this time. Mild cough. Discussed that the cough could persist for weeks. She'll monitor for recurrence.   Orders Placed This Encounter  Procedures  . DG Bone Density    Standing Status:   Future    Standing Expiration Date:   09/02/2017    Order Specific Question:   Reason for Exam (SYMPTOM  OR DIAGNOSIS REQUIRED)    Answer:   osteoporosis screening    Order Specific Question:   Preferred imaging location?    Answer:   [facility]    [name], MD [place]

## 2016-07-03 NOTE — Assessment & Plan Note (Signed)
Patient with recent upper respiratory infection symptoms. Likely viral as she has improved at this time. Mild cough. Discussed that the cough could persist for weeks. She'll monitor for recurrence.

## 2016-07-03 NOTE — Assessment & Plan Note (Signed)
Patient with a history of low impact fracture of her left ankle. No recent DEXA scan. Discussed that this would be concerning for osteoporosis. Has been many years since this occurred. She'll continue vitamin D. I encouraged calcium intake as well. We'll check a DEXA scan.

## 2016-07-03 NOTE — Progress Notes (Signed)
Pre visit review using our clinic review tool, if applicable. No additional management support is needed unless otherwise documented below in the visit note. 

## 2016-07-23 ENCOUNTER — Encounter: Payer: Self-pay | Admitting: Family Medicine

## 2016-08-01 ENCOUNTER — Ambulatory Visit (INDEPENDENT_AMBULATORY_CARE_PROVIDER_SITE_OTHER): Payer: Medicare Other

## 2016-08-01 DIAGNOSIS — Z23 Encounter for immunization: Secondary | ICD-10-CM | POA: Diagnosis not present

## 2016-08-11 DIAGNOSIS — Z4689 Encounter for fitting and adjustment of other specified devices: Secondary | ICD-10-CM | POA: Diagnosis not present

## 2016-08-11 DIAGNOSIS — N816 Rectocele: Secondary | ICD-10-CM | POA: Diagnosis not present

## 2016-08-11 DIAGNOSIS — N8110 Cystocele, unspecified: Secondary | ICD-10-CM | POA: Diagnosis not present

## 2016-08-11 DIAGNOSIS — R3911 Hesitancy of micturition: Secondary | ICD-10-CM | POA: Diagnosis not present

## 2016-08-24 ENCOUNTER — Ambulatory Visit
Admission: RE | Admit: 2016-08-24 | Discharge: 2016-08-24 | Disposition: A | Discharge status: Home / Self Care | Payer: Medicare Other | Source: Ambulatory Visit | Attending: Family Medicine | Admitting: Family Medicine

## 2016-08-24 DIAGNOSIS — M85851 Other specified disorders of bone density and structure, right thigh: Secondary | ICD-10-CM | POA: Insufficient documentation

## 2016-08-24 DIAGNOSIS — Z13820 Encounter for screening for osteoporosis: Secondary | ICD-10-CM

## 2016-09-14 ENCOUNTER — Ambulatory Visit (INDEPENDENT_AMBULATORY_CARE_PROVIDER_SITE_OTHER): Payer: Medicare Other | Admitting: Family Medicine

## 2016-09-14 ENCOUNTER — Encounter: Payer: Self-pay | Admitting: Family Medicine

## 2016-09-14 VITALS — BP 136/82 | HR 62 | Temp 97.6°F | Wt 145.8 lb

## 2016-09-14 DIAGNOSIS — M8580 Other specified disorders of bone density and structure, unspecified site: Secondary | ICD-10-CM

## 2016-09-14 DIAGNOSIS — E785 Hyperlipidemia, unspecified: Secondary | ICD-10-CM | POA: Diagnosis not present

## 2016-09-14 MED ORDER — DENOSUMAB 60 MG/ML ~~LOC~~ SOLN: 60.0000 mg | Freq: Once | SUBCUTANEOUS | Status: DC

## 2016-09-14 NOTE — Progress Notes (Signed)
Pre visit review using our clinic review tool, if applicable. No additional management support is needed unless otherwise documented below in the visit note. 

## 2016-09-14 NOTE — Progress Notes (Signed)
  [name], MD Phone: [phone]  Jane Willis is a 75 y.o. female who presents today for follow-up.  Osteopenia: Patient had a recent bone density test that revealed a T score of -2.2. Had a 10 year major fracture risk of 23.1% and 10 year risk of hip fracture of 6.5%. This meets criteria for treatment for osteoporosis. She does report a strong family history of reflux and esophageal issues though she has no reflux symptoms. She does take calcium and vitamin D.  HYPERLIPIDEMIA Symptoms Chest pain on exertion:  No   Leg claudication:   No Takes a Fish oil and aspirin. Diligently works on her diet with olive oil, apples, and oatmeal daily. Does not exercise very much at this time.   PMH: nonsmoker.   ROS see history of present illness  Objective  Physical Exam Vitals:   09/14/16 0909  BP: 136/82  Pulse: 62  Temp: 97.6 F (36.4 C)    BP Readings from Last 3 Encounters:  09/14/16 136/82  07/03/16 126/84  04/02/16 126/78   Wt Readings from Last 3 Encounters:  09/14/16 145 lb 12.8 oz (66.1 kg)  07/03/16 143 lb (64.9 kg)  04/02/16 144 lb 3.2 oz (65.4 kg)    Physical Exam  Constitutional: She is well-developed, well-nourished, and in no distress.  Cardiovascular: Normal rate, regular rhythm and normal heart sounds.   Pulmonary/Chest: Effort normal and breath sounds normal.  Musculoskeletal: She exhibits no edema.  Neurological: She is alert. Gait normal.  Skin: Skin is warm and dry.     Assessment/Plan: Please see individual problem list.  Osteopenia Patient with T score on recent DEXA scan indicating osteopenia though has significant risk for major osteoporotic fracture or hip fracture. Meets criteria for treatment of osteoporosis. Discussed medication options including bisphosphonate, injectable bisphosphonate, and Prolia. Patient reports some concern for her family history of esophageal issues and reflux. She would like to see if we can get Prolia  covered. This will be ordered and patient will be contacted. If it is covered she will need to return for a calcium and vitamin D check. She will continue vitamin D and calcium supplementations.  Hyperlipidemia Had a long discussion with patient regarding the benefits of taking a statin and the risks of not taking one. Patient continues to decline medication for her hyperlipidemia. She will continue fish oil and aspirin. She'll continue to work on diet. She was encouraged to exercise. We'll plan on rechecking her cholesterol at her yearly physical per discussion today.   No orders of the defined types were placed in this encounter.   Meds ordered this encounter  Medications  . denosumab (PROLIA) injection 60 mg    [name], MD [place]

## 2016-09-14 NOTE — Assessment & Plan Note (Signed)
Had a long discussion with patient regarding the benefits of taking a statin and the risks of not taking one. Patient continues to decline medication for her hyperlipidemia. She will continue fish oil and aspirin. She'll continue to work on diet. She was encouraged to exercise. We'll plan on rechecking her cholesterol at her yearly physical per discussion today.

## 2016-09-14 NOTE — Assessment & Plan Note (Signed)
Patient with T score on recent DEXA scan indicating osteopenia though has significant risk for major osteoporotic fracture or hip fracture. Meets criteria for treatment of osteoporosis. Discussed medication options including bisphosphonate, injectable bisphosphonate, and Prolia. Patient reports some concern for her family history of esophageal issues and reflux. She would like to see if we can get Prolia covered. This will be ordered and patient will be contacted. If it is covered she will need to return for a calcium and vitamin D check. She will continue vitamin D and calcium supplementations.

## 2016-09-14 NOTE — Patient Instructions (Addendum)
Nice to see you. We will attempt to get Prolia approved to help treat osteopenia. Please continue your fish oil and aspirin. Please continue work on diet and exercise. You should take in 1200 mg of calcium daily and 800 international units of vitamin D daily.

## 2016-10-08 NOTE — Progress Notes (Signed)
Prolia process started

## 2016-10-23 ENCOUNTER — Other Ambulatory Visit: Payer: Self-pay | Admitting: Family Medicine

## 2016-10-23 DIAGNOSIS — M810 Age-related osteoporosis without current pathological fracture: Secondary | ICD-10-CM

## 2016-10-23 NOTE — Progress Notes (Signed)
Please contact the patient to see if she can come in for calcium and vitamin D testing prior to starting prolia. Thanks.

## 2016-10-23 NOTE — Progress Notes (Signed)
Scheduled patient for lab and faxed order to start [place] for Prolia.

## 2016-10-27 ENCOUNTER — Other Ambulatory Visit (INDEPENDENT_AMBULATORY_CARE_PROVIDER_SITE_OTHER): Payer: Medicare Other

## 2016-10-27 DIAGNOSIS — M810 Age-related osteoporosis without current pathological fracture: Secondary | ICD-10-CM

## 2016-10-27 LAB — BASIC METABOLIC PANEL
BUN: 15 mg/dL (ref 6–23)
CALCIUM: 9.9 mg/dL (ref 8.4–10.5)
CHLORIDE: 103 meq/L (ref 96–112)
CO2: 28 mEq/L (ref 19–32)
CREATININE: 0.78 mg/dL (ref 0.40–1.20)
GFR: 76.04 mL/min (ref >60.00)
Glucose, Bld: 75 mg/dL (ref 70–99)
Potassium: 4.3 mEq/L (ref 3.5–5.1)
Sodium: 138 mEq/L (ref 135–145)

## 2016-10-27 LAB — VITAMIN D 25 HYDROXY (VIT D DEFICIENCY, FRACTURES): VITD: 71.7 ng/mL (ref 30.00–100.00)

## 2016-11-03 ENCOUNTER — Telehealth: Payer: Self-pay | Admitting: Family Medicine

## 2016-11-03 NOTE — Telephone Encounter (Signed)
Received approval for prolia , labs in ok to order.

## 2016-11-04 NOTE — Telephone Encounter (Signed)
It is okay to order. Patient should be taking vitamin D and calcium supplements as well. Thanks.

## 2016-12-31 ENCOUNTER — Ambulatory Visit (INDEPENDENT_AMBULATORY_CARE_PROVIDER_SITE_OTHER): Payer: Medicare Other | Admitting: Family Medicine

## 2016-12-31 ENCOUNTER — Encounter: Payer: Self-pay | Admitting: Family Medicine

## 2016-12-31 DIAGNOSIS — R030 Elevated blood-pressure reading, without diagnosis of hypertension: Secondary | ICD-10-CM | POA: Diagnosis not present

## 2016-12-31 DIAGNOSIS — I10 Essential (primary) hypertension: Secondary | ICD-10-CM | POA: Insufficient documentation

## 2016-12-31 DIAGNOSIS — J069 Acute upper respiratory infection, unspecified: Secondary | ICD-10-CM

## 2016-12-31 DIAGNOSIS — E785 Hyperlipidemia, unspecified: Secondary | ICD-10-CM | POA: Diagnosis not present

## 2016-12-31 LAB — COMPREHENSIVE METABOLIC PANEL
ALT: 24 U/L (ref 0–35)
AST: 23 U/L (ref 0–37)
Albumin: 3.9 g/dL (ref 3.5–5.2)
Alkaline Phosphatase: 61 U/L (ref 39–117)
BILIRUBIN TOTAL: 0.5 mg/dL (ref 0.2–1.2)
BUN: 13 mg/dL (ref 6–23)
CO2: 26 mEq/L (ref 19–32)
CREATININE: 0.78 mg/dL (ref 0.40–1.20)
Calcium: 9.3 mg/dL (ref 8.4–10.5)
Chloride: 105 mEq/L (ref 96–112)
GFR: 76.01 mL/min (ref >60.00)
GLUCOSE: 101 mg/dL — AB (ref 70–99)
Potassium: 4.1 mEq/L (ref 3.5–5.1)
Sodium: 139 mEq/L (ref 135–145)
Total Protein: 6.9 g/dL (ref 6.0–8.3)

## 2016-12-31 LAB — LDL CHOLESTEROL, DIRECT: Direct LDL: 151 mg/dL

## 2016-12-31 NOTE — Patient Instructions (Addendum)
Nice to see you. We will check your lab work today and contact you with the results. We will request records the report from your prior colonoscopy. Please check her blood pressure at home and follow-up in one week for BP check.

## 2016-12-31 NOTE — Progress Notes (Signed)
Pre visit review using our clinic review tool, if applicable. No additional management support is needed unless otherwise documented below in the visit note. 

## 2016-12-31 NOTE — Assessment & Plan Note (Signed)
Check LDL cholesterol. Continue Fish oil. Continue to stay active.

## 2016-12-31 NOTE — Assessment & Plan Note (Addendum)
Elevated today. Has been well controlled previously. We will have her follow-up in a week for recheck. She will check her blood pressure at home and bring in readings at that time.

## 2016-12-31 NOTE — Assessment & Plan Note (Signed)
Had symptoms previously. Has resolved now. Encouraged nasal saline or Flonase if she has recurrent nasal symptoms.

## 2016-12-31 NOTE — Progress Notes (Signed)
  [name], MD Phone: [phone]  Jane Willis is a 76 y.o. female who presents today for follow-up.  Elevated blood pressure: Has been good recently. No chest pain or shortness of breath. She has not been on medication for this in a long time.  Hyperlipidemia: Taking fish oil. She is staying very active and did a lot of walking on a trip to [place]. No chest pain or claudication.  She had a cold over the last couple of weeks. Mostly was head congestion and blowing clear mucus out of her nose. No chest congestion. Notes her symptoms are overall much improved. Minimal nasal congestion. She's been using Vaseline for this.  PMH: nonsmoker.   ROS see history of present illness  Objective  Physical Exam Vitals:   12/31/16 0807  BP: (!) 150/90  Pulse: 64  Temp: 98.2 F (36.8 C)    BP Readings from Last 3 Encounters:  12/31/16 (!) 150/90  09/14/16 136/82  07/03/16 126/84   Wt Readings from Last 3 Encounters:  12/31/16 142 lb 12.8 oz (64.8 kg)  09/14/16 145 lb 12.8 oz (66.1 kg)  07/03/16 143 lb (64.9 kg)    Physical Exam  Constitutional: No distress.  HENT:  Head: Normocephalic and atraumatic.  Mouth/Throat: Oropharynx is clear and moist. No oropharyngeal exudate.  Normal TMs  Eyes: Conjunctivae are normal. Pupils are equal, round, and reactive to light.  Cardiovascular: Normal rate, regular rhythm and normal heart sounds.   Pulmonary/Chest: Effort normal and breath sounds normal.  Musculoskeletal: She exhibits no edema.  Neurological: She is alert. Gait normal.  Skin: Skin is warm and dry. She is not diaphoretic.     Assessment/Plan: Please see individual problem list.  Hyperlipidemia Check LDL cholesterol. Continue Fish oil. Continue to stay active.  URI (upper respiratory infection) Had symptoms previously. Has resolved now. Encouraged nasal saline or Flonase if she has recurrent nasal symptoms.  Elevated blood pressure reading Elevated today. Has  been well controlled previously. We will have her follow-up in a week for recheck. She will check her blood pressure at home and bring in readings at that time.   Orders Placed This Encounter  Procedures  . Direct LDL  . Comp Met (CMET)    [name], MD [place]

## 2017-01-07 ENCOUNTER — Ambulatory Visit (INDEPENDENT_AMBULATORY_CARE_PROVIDER_SITE_OTHER): Payer: Medicare Other

## 2017-01-07 VITALS — BP 148/80 | HR 66 | Resp 20

## 2017-01-07 DIAGNOSIS — R030 Elevated blood-pressure reading, without diagnosis of hypertension: Secondary | ICD-10-CM

## 2017-01-07 MED ORDER — AMLODIPINE BESYLATE 5 MG PO TABS: 5.0000 mg | ORAL_TABLET | Freq: Every day | ORAL | 3 refills | Status: DC

## 2017-01-07 NOTE — Progress Notes (Signed)
BP above goal at home. I will send in amlodipine for her to start on. She should continue to check her BP and she should follow-up with me in 4 weeks.

## 2017-01-07 NOTE — Progress Notes (Signed)
Patient was seen on 3/15 for an OV and BP was high. Patient is not currently on any medications for hypertension.  Checked BP in bilateral upper extremities, see vitals for details.  Patient has been checking BP at home, home readings have been scattered either in the am or PM per patient.  Friday: 148/88, Sunday 159/88 and 148/81, Monday: 164/85, Tuesday: 154/82, and Wednesday 156/88 and 150/81.  Patient is willing to try a low dose BP medication if needed. She is hoping to be more active in the next couple months and hopes to not need medications. thanks

## 2017-01-07 NOTE — Addendum Note (Signed)
Addended by: Leone Haven on: 01/07/2017 01:34 PM   Modules accepted: Orders

## 2017-01-08 ENCOUNTER — Telehealth: Payer: Self-pay | Admitting: Family Medicine

## 2017-01-08 NOTE — Telephone Encounter (Signed)
Pt called and left a vm returning your call. Thank you!  Call pt @ [phone]

## 2017-01-08 NOTE — Progress Notes (Signed)
Left a VM to return my call, thanks 

## 2017-01-08 NOTE — Progress Notes (Signed)
Spoke with patient, scheduled follow up. thanks

## 2017-01-08 NOTE — Telephone Encounter (Signed)
See additional note for details. thanks

## 2017-01-11 ENCOUNTER — Encounter: Payer: Self-pay | Admitting: Family Medicine

## 2017-01-20 ENCOUNTER — Telehealth: Payer: Self-pay

## 2017-01-20 NOTE — Telephone Encounter (Signed)
Pt is scheduled for 4/11 @ 11:30.

## 2017-01-20 NOTE — Telephone Encounter (Signed)
Left a VM to return my call to review [place] verification and scheduling. thanks

## 2017-01-22 NOTE — Telephone Encounter (Signed)
Injection ordered, thanks

## 2017-01-27 ENCOUNTER — Ambulatory Visit (INDEPENDENT_AMBULATORY_CARE_PROVIDER_SITE_OTHER): Payer: Medicare Other | Admitting: *Deleted

## 2017-01-27 ENCOUNTER — Telehealth: Payer: Self-pay

## 2017-01-27 DIAGNOSIS — M818 Other osteoporosis without current pathological fracture: Secondary | ICD-10-CM | POA: Diagnosis not present

## 2017-01-27 MED ORDER — DENOSUMAB 60 MG/ML ~~LOC~~ SOLN
60.0000 mg | Freq: Once | SUBCUTANEOUS | Status: AC
  Administered 2017-01-27: 60 mg via SUBCUTANEOUS

## 2017-01-27 NOTE — Telephone Encounter (Signed)
Patient would like ot discuss the cologuard with you at her [place] appointment

## 2017-01-27 NOTE — Telephone Encounter (Signed)
Noted  

## 2017-01-27 NOTE — Progress Notes (Signed)
I have reviewed the above note and agree.  Mayerli Kirst, M.D.  

## 2017-01-27 NOTE — Telephone Encounter (Signed)
Called patient to see if she has been set up for a colonoscopy, she is due for colonoscopy in may per Dr.Sonnenberg, with DR.Vira Agar

## 2017-01-27 NOTE — Progress Notes (Signed)
Patient presented for first Prolia injection, patient voiced no concerns and showed no signs of distress during injection.

## 2017-02-11 ENCOUNTER — Ambulatory Visit (INDEPENDENT_AMBULATORY_CARE_PROVIDER_SITE_OTHER): Payer: Medicare Other | Admitting: Family Medicine

## 2017-02-11 ENCOUNTER — Encounter: Payer: Self-pay | Admitting: Family Medicine

## 2017-02-11 VITALS — BP 140/74 | HR 66 | Temp 97.8°F | Ht 63.0 in | Wt 144.4 lb

## 2017-02-11 DIAGNOSIS — Z0000 Encounter for general adult medical examination without abnormal findings: Secondary | ICD-10-CM | POA: Diagnosis not present

## 2017-02-11 DIAGNOSIS — Z800 Family history of malignant neoplasm of digestive organs: Secondary | ICD-10-CM | POA: Insufficient documentation

## 2017-02-11 DIAGNOSIS — Z1211 Encounter for screening for malignant neoplasm of colon: Secondary | ICD-10-CM | POA: Diagnosis not present

## 2017-02-11 DIAGNOSIS — M8580 Other specified disorders of bone density and structure, unspecified site: Secondary | ICD-10-CM | POA: Diagnosis not present

## 2017-02-11 DIAGNOSIS — I10 Essential (primary) hypertension: Secondary | ICD-10-CM

## 2017-02-11 MED ORDER — AMLODIPINE BESYLATE 10 MG PO TABS: 10.0000 mg | ORAL_TABLET | Freq: Every day | ORAL | 3 refills | Status: DC

## 2017-02-11 NOTE — Assessment & Plan Note (Signed)
BP still somewhat elevated. Just slightly above goal for her age. We will increase the amlodipine to 10 mg. She'll take two 5 mg tablets until her current prescription has run out. We'll see her back in a month.

## 2017-02-11 NOTE — Addendum Note (Signed)
Addended by: Dia Crawford on: 02/11/2017 10:08 AM   Modules accepted: SmartSet

## 2017-02-11 NOTE — Progress Notes (Signed)
  [name], MD Phone: [phone]  Jane Willis is a 76 y.o. female who presents today for follow-up.  Hypertension: Currently on amlodipine. Blood pressures are mostly in the 140s over 80s to 90. No chest pain, shortness breath, or edema.  Osteoporosis: Recently started Prolia. Has had no issues with this. She's been on vitamin D and calcium and is taking both. Does have a history of ankle fracture with no impact.  She wants know she can do cologuard.   ROS see history of present illness  Objective  Physical Exam Vitals:   02/11/17 0815  BP: 140/74  Pulse: 66  Temp: 97.8 F (36.6 C)    BP Readings from Last 3 Encounters:  02/11/17 140/74  01/07/17 (!) 148/80  12/31/16 (!) 150/90   Wt Readings from Last 3 Encounters:  02/11/17 144 lb 6.4 oz (65.5 kg)  12/31/16 142 lb 12.8 oz (64.8 kg)  09/14/16 145 lb 12.8 oz (66.1 kg)    Physical Exam  Constitutional: No distress.  Cardiovascular: Normal rate, regular rhythm and normal heart sounds.   Pulmonary/Chest: Effort normal and breath sounds normal.  Musculoskeletal: She exhibits no edema.  Neurological: She is alert. Gait normal.  Skin: Skin is warm and dry. She is not diaphoretic.     Assessment/Plan: Please see individual problem list.  Hypertension BP still somewhat elevated. Just slightly above goal for her age. We will increase the amlodipine to 10 mg. She'll take two 5 mg tablets until her current prescription has run out. We'll see her back in a month.  Osteopenia Patient recently started on Prolia. Her FRAX score indicated she would benefit from treatment for this. Has tolerated this. Is currently taking vitamin D and calcium.  Colon cancer screening Patient questioned whether or not she could do cologuard. It appears that she has a family history of colon cancer in her brother. Most recent colonoscopy reviewed revealing no polyps. Discussed that she would likely not be able to do cologuard and we  would need to consider having her see GI again for screening.   No orders of the defined types were placed in this encounter.   Meds ordered this encounter  Medications  . amLODipine (NORVASC) 10 MG tablet    Sig: Take 1 tablet (10 mg total) by mouth daily.    Dispense:  90 tablet    Refill:  Lydia, MD [place]

## 2017-02-11 NOTE — Progress Notes (Signed)
Pre visit review using our clinic review tool, if applicable. No additional management support is needed unless otherwise documented below in the visit note. 

## 2017-02-11 NOTE — Progress Notes (Signed)
Subjective:   Jane Willis is a 76 y.o. female who presents for an Initial Medicare Annual Wellness Visit.  Review of Systems    No ROS.  Medicare Wellness Visit.  Cardiac Risk Factors include: advanced age (>18men, >72 women);hypertension     Objective:    Today's Vitals   02/11/17 0815  BP: 140/74  Pulse: 66  Temp: 97.8 F (36.6 C)  TempSrc: Oral  SpO2: 97%  Weight: 144 lb 6.4 oz (65.5 kg)  Height: 5\' 3"  (1.6 m)   Body mass index is 25.58 kg/m.   Current Medications (verified) Outpatient Encounter Prescriptions as of 02/11/2017  Medication Sig  . amLODipine (NORVASC) 10 MG tablet Take 1 tablet (10 mg total) by mouth daily.  Marland Kitchen aspirin 81 MG tablet Take 81 mg by mouth daily. Reported on 02/21/2016  . Cholecalciferol (VITAMIN D-3) 1000 UNITS CAPS Take 1,000 capsules by mouth 2 (two) times daily.  . Omega-3 Fatty Acids (FISH OIL) 1360 MG CAPS Take by mouth.  . vitamin C (ASCORBIC ACID) 500 MG tablet Take 500 mg by mouth daily.  . [DISCONTINUED] amLODipine (NORVASC) 5 MG tablet Take 1 tablet (5 mg total) by mouth daily.   Facility-Administered Encounter Medications as of 02/11/2017  Medication  . denosumab (PROLIA) injection 60 mg    Allergies (verified) Patient has no known allergies.   History: Past Medical History:  Diagnosis Date  . Allergy    Seasonal  . Arthritis   . Chicken pox   . Hyperlipidemia   . Hypertension   . Urinary incontinence    Past Surgical History:  Procedure Laterality Date  . ABDOMINAL HYSTERECTOMY  1986  . BREAST SURGERY  1968  . CATARACT EXTRACTION  2014/2015   Both eyes  . FRACTURE SURGERY  01/2009   Fractured Left ankle, plate on outside of ankle 2 rods through/across ankle from inside of ankle  . TONSILLECTOMY AND ADENOIDECTOMY     Family History  Problem Relation Age of Onset  . Arthritis Mother   . Hypertension Mother   . Arthritis Father   . Hypertension Father   . Arthritis Maternal Grandmother   . Cancer  Brother     Colon  . Cancer Maternal Aunt     Breast Cancer   Social History   Occupational History  . Not on file.   Social History Main Topics  . Smoking status: Never Smoker  . Smokeless tobacco: Never Used  . Alcohol use No  . Drug use: No  . Sexual activity: No    Tobacco Counseling Counseling given: Not Answered   Activities of Daily Living In your present state of health, do you have any difficulty performing the following activities: 02/11/2017  Hearing? N  Vision? N  Difficulty concentrating or making decisions? N  Walking or climbing stairs? N  Dressing or bathing? N  Doing errands, shopping? N  Preparing Food and eating ? N  Using the Toilet? N  In the past six months, have you accidently leaked urine? N  Do you have problems with loss of bowel control? N  Managing your Medications? N  Managing your Finances? N  Housekeeping or managing your Housekeeping? N  Some recent data might be hidden    Immunizations and Health Maintenance Immunization History  Administered Date(s) Administered  . Influenza, High Dose Seasonal PF 08/01/2016  . Pneumococcal-Unspecified 01/25/2012  . Tdap 01/21/2015   Health Maintenance Due  Topic Date Due  . PNA vac Low Risk Adult (2  of 2 - PCV13) 01/24/2013    Patient Care Team: Jane Haven, MD as PCP - General (Family Medicine)  Indicate any recent Medical Services you may have received from other than [facility] providers in the past year (date may be approximate).     Assessment:   This is a routine wellness examination for Jane Willis. The goal of the wellness visit is to assist the patient how to close the gaps in care and create a preventative care plan for the patient.   Taking Prolia as appropriate/Osteoporosis reviewed.  Medications reviewed; taking without issues or barriers.  Safety issues reviewed; smoke detectors in the home. No firearms in the home.  Wears seatbelts when driving or riding with others.  Patient does wear sunscreen or protective clothing when in direct sunlight. No violence in the home.  Patient is alert, normal appearance, oriented to person/place/and time. Correctly identified the president of the [place], recall of 3/3 words, and performing simple calculations.  Patient displays appropriate judgement and can read correct time from watch face.  No new identified risk were noted.  No failures at ADL's or IADL's.   BMI- discussed the importance of a healthy diet, water intake and exercise. Educational material provided.   HTN- followed by PCP.  Dental- every six months.  Sleep patterns- Sleeps 6-8 hours at night.  Wakes feeling rested.  Pneumococcal discussed, deferred per patient preference.  Educational material provided.  Patient Concerns: None at this time. Follow up with PCP as needed.  Hearing/Vision screen Hearing Screening Comments: Patient is able to hear conversational tones without difficulty.  No issues reported.  Vision Screening Comments: Followed by [facility] Wears corrective lenses when reading Last OV 2017 Cataract extraction, bilateral Visual acuity not assessed per patient preference since they have regular follow up with the ophthalmologist  Dietary issues and exercise activities discussed: Current Exercise Habits: Home exercise routine, Time (Minutes): 60, Frequency (Times/Week): 1, Weekly Exercise (Minutes/Week): 60, Intensity: Moderate  Goals    . Increase physical activity      Depression Screen PHQ 2/9 Scores 02/11/2017 12/31/2016  PHQ - 2 Score 0 0  PHQ- 9 Score 0 -    Fall Risk Fall Risk  02/11/2017 12/31/2016  Falls in the past year? No No    Cognitive Function: MMSE - Mini Mental State Exam 02/11/2017  Orientation to time 5  Orientation to Place 5  Registration 3  Attention/ Calculation 5  Recall 3  Language- name 2 objects 2  Language- repeat 1  Language- follow 3 step command 3  Language- read & follow direction  1  Write a sentence 1  Copy design 1  Total score 30        Screening Tests Health Maintenance  Topic Date Due  . PNA vac Low Risk Adult (2 of 2 - PCV13) 01/24/2013  . INFLUENZA VACCINE  05/19/2017  . TETANUS/TDAP  01/20/2025  . DEXA SCAN  Completed      Plan:    End of life planning; Advance aging; Advanced directives discussed. Copy of current HCPOA/Living Will requested.    I have personally reviewed and noted the following in the patient's chart:   . Medical and social history . Use of alcohol, tobacco or illicit drugs  . Current medications and supplements . Functional ability and status . Nutritional status . Physical activity . Advanced directives . List of other physicians . Hospitalizations, surgeries, and ER visits in previous 12 months . Vitals . Screenings to include cognitive,  depression, and falls . Referrals and appointments  In addition, I have reviewed and discussed with patient certain preventive protocols, quality metrics, and best practice recommendations. A written personalized care plan for preventive services as well as general preventive health recommendations were provided to patient.     Varney Biles, LPN   03/05/3436

## 2017-02-11 NOTE — Assessment & Plan Note (Signed)
Patient questioned whether or not she could do cologuard. It appears that she has a family history of colon cancer in her brother. Most recent colonoscopy reviewed revealing no polyps. Discussed that she would likely not be able to do cologuard and we would need to consider having her see GI again for screening.

## 2017-02-11 NOTE — Assessment & Plan Note (Signed)
Patient recently started on Prolia. Her FRAX score indicated she would benefit from treatment for this. Has tolerated this. Is currently taking vitamin D and calcium.

## 2017-02-11 NOTE — Patient Instructions (Addendum)
Nice to see you. We will increase your amlodipine to 10 mg daily. We're going to request your colonoscopy report.   These are the goals we discussed: Goals    . Increase physical activity       This is a list of the screening recommended for you and due dates:  Health Maintenance  Topic Date Due  . Pneumonia vaccines (2 of 2 - PCV13) 01/24/2013  . Flu Shot  05/19/2017  . Tetanus Vaccine  01/20/2025  . DEXA scan (bone density measurement)  Completed

## 2017-02-12 NOTE — Progress Notes (Signed)
I have reviewed the above note and agree.  Tyishia Aune, M.D.  

## 2017-02-17 ENCOUNTER — Encounter: Payer: Self-pay | Admitting: Family Medicine

## 2017-02-17 ENCOUNTER — Telehealth: Payer: Self-pay

## 2017-02-17 NOTE — Telephone Encounter (Signed)
Noted. Thanks.

## 2017-02-17 NOTE — Telephone Encounter (Signed)
Spoke with patient to inform her that she is due for a colonoscopy after 03/09/17. Patient will call [facility] to schedule this.

## 2017-02-25 DIAGNOSIS — N8110 Cystocele, unspecified: Secondary | ICD-10-CM | POA: Diagnosis not present

## 2017-02-25 DIAGNOSIS — R3911 Hesitancy of micturition: Secondary | ICD-10-CM | POA: Diagnosis not present

## 2017-03-19 ENCOUNTER — Ambulatory Visit (INDEPENDENT_AMBULATORY_CARE_PROVIDER_SITE_OTHER): Payer: Medicare Other | Admitting: Family Medicine

## 2017-03-19 ENCOUNTER — Encounter: Payer: Self-pay | Admitting: Family Medicine

## 2017-03-19 DIAGNOSIS — E785 Hyperlipidemia, unspecified: Secondary | ICD-10-CM

## 2017-03-19 DIAGNOSIS — J309 Allergic rhinitis, unspecified: Secondary | ICD-10-CM | POA: Insufficient documentation

## 2017-03-19 DIAGNOSIS — J302 Other seasonal allergic rhinitis: Secondary | ICD-10-CM | POA: Diagnosis not present

## 2017-03-19 DIAGNOSIS — I10 Essential (primary) hypertension: Secondary | ICD-10-CM

## 2017-03-19 NOTE — Patient Instructions (Signed)
Nice to see. Please continue to monitor your blood pressure. Please monitor the slight swelling you get in your ankles. If this does not improve we could consider changing your blood pressure medications. Please continue to work on diet and exercise. Please consider using your Flonase daily to help with your nasal congestion.

## 2017-03-19 NOTE — Progress Notes (Signed)
  [name], MD Phone: [phone]  Jane Willis is a 76 y.o. female who presents today for f/u.  HYPERTENSION  Disease Monitoring  Home BP Monitoring typically 120s-130s, single value greater than 150, occasionally in the 140s Chest pain- no    Dyspnea- no Medications  Compliance-  taking amlodipine.  Edema- minimal, improves with propping legs up and wearing support hose. No orthopnea or PND.  Hyperlipidemia: Patient refuses to take a statin. She exercises by doing yard work. Her diet is described as healthy. No heavy meals. She was given cholesterol diet information previously and she essentially follows 100%.  Allergic rhinitis: Patient notes she'll wake up in the morning and feel congested in her maxillary sinuses and  nose. This is an going on for some time. Has trouble breathing out of her nose and this happens. Resolves relatively quickly after getting up. Occasionally uses Flonase with good benefit. Does note occasional sinus drainage.  PMH: nonsmoker.   ROS see history of present illness  Objective  Physical Exam Vitals:   03/19/17 1559  BP: 132/82  Pulse: 74  Temp: 98.7 F (37.1 C)    BP Readings from Last 3 Encounters:  03/19/17 132/82  02/11/17 140/74  01/07/17 (!) 148/80   Wt Readings from Last 3 Encounters:  03/19/17 146 lb 9.6 oz (66.5 kg)  02/11/17 144 lb 6.4 oz (65.5 kg)  12/31/16 142 lb 12.8 oz (64.8 kg)    Physical Exam  Constitutional: No distress.  HENT:  Head: Normocephalic and atraumatic.  Mouth/Throat: Oropharynx is clear and moist. No oropharyngeal exudate.  Normal TMs  Eyes: Conjunctivae are normal. Pupils are equal, round, and reactive to light.  Cardiovascular: Normal rate, regular rhythm and normal heart sounds.   Pulmonary/Chest: Effort normal and breath sounds normal.  Musculoskeletal: She exhibits no edema.  Neurological: She is alert. Gait normal.  Skin: She is not diaphoretic.     Assessment/Plan: Please see  individual problem list.  Hypertension At goal for her age. She'll continue to monitor. Suspect slight swelling is related to either venous insufficiency or her amlodipine. She'll continue to monitor. If this becomes bothersome or worsens we could consider changing her amlodipine.  Allergic rhinitis Suspect intermittent sinus and nasal congestion is related to allergies. I encouraged her to use her Flonase consistently.  Hyperlipidemia Patient refuses cholesterol medication. Encouraged diet and exercise.   [name], MD [place]

## 2017-03-19 NOTE — Assessment & Plan Note (Signed)
At goal for her age. She'll continue to monitor. Suspect slight swelling is related to either venous insufficiency or her amlodipine. She'll continue to monitor. If this becomes bothersome or worsens we could consider changing her amlodipine.

## 2017-03-19 NOTE — Assessment & Plan Note (Signed)
Suspect intermittent sinus and nasal congestion is related to allergies. I encouraged her to use her Flonase consistently.

## 2017-03-19 NOTE — Assessment & Plan Note (Signed)
Patient refuses cholesterol medication. Encouraged diet and exercise.

## 2017-06-29 ENCOUNTER — Ambulatory Visit (INDEPENDENT_AMBULATORY_CARE_PROVIDER_SITE_OTHER): Payer: Medicare Other | Admitting: Family Medicine

## 2017-06-29 ENCOUNTER — Encounter: Payer: Self-pay | Admitting: Family Medicine

## 2017-06-29 VITALS — BP 140/82 | HR 67 | Temp 98.3°F | Wt 144.4 lb

## 2017-06-29 DIAGNOSIS — I10 Essential (primary) hypertension: Secondary | ICD-10-CM | POA: Diagnosis not present

## 2017-06-29 DIAGNOSIS — R229 Localized swelling, mass and lump, unspecified: Secondary | ICD-10-CM

## 2017-06-29 DIAGNOSIS — E785 Hyperlipidemia, unspecified: Secondary | ICD-10-CM

## 2017-06-29 DIAGNOSIS — M8580 Other specified disorders of bone density and structure, unspecified site: Secondary | ICD-10-CM

## 2017-06-29 NOTE — Assessment & Plan Note (Signed)
Encourage diet and exercise. Patient declined statin use.

## 2017-06-29 NOTE — Progress Notes (Signed)
  [name], MD Phone: [phone]  Jane Willis is a 76 y.o. female who presents today for follow-up.  Hypertension: Blood pressure typically running 120s over 70s at home. Taking amlodipine. No chest pain or shortness of breath. Notes lower extremity swelling started after taking the amlodipine. Goes down with compression stockings. No orthopnea. It is bilateral.  Hyperlipidemia: Patient is against statin. She stays active daily. She has a healthy diet with fish 1-2 times a week. No fried foods. Eats plenty of fruits and vegetables.  Osteopenia: On Prolia. Has gotten one injection. She currently takes calcium and vitamin D. She does note there was a nodule she believes in the area where she got the Prolia though she is not sure when this came up. It has been there for a number of months. It does not hurt.  PMH: nonsmoker.   ROS see history of present illness  Objective  Physical Exam Vitals:   06/29/17 1013  BP: 140/82  Pulse: 67  Temp: 98.3 F (36.8 C)  SpO2: 98%    BP Readings from Last 3 Encounters:  06/29/17 140/82  03/19/17 132/82  02/11/17 140/74   Wt Readings from Last 3 Encounters:  06/29/17 144 lb 6.4 oz (65.5 kg)  03/19/17 146 lb 9.6 oz (66.5 kg)  02/11/17 144 lb 6.4 oz (65.5 kg)    Physical Exam  Constitutional: No distress.  Cardiovascular: Normal rate, regular rhythm and normal heart sounds.   Pulmonary/Chest: Effort normal and breath sounds normal.  Skin: She is not diaphoretic.        Assessment/Plan: Please see individual problem list.  Hypertension Controlled. Continue current medication.  Osteopenia Doing well with Prolia. Continue calcium and vitamin D. Check lab work in 2-1/2 months.  Hyperlipidemia Encourage diet and exercise. Patient declined statin use.  Subcutaneous nodule Could be related to the Prolia injection though could also be a lymph node. We will obtain an ultrasound. We'll have our RN report this to the  Prolia representative.   Orders Placed This Encounter  Procedures  . [place] LT UPPER EXTREM LTD SOFT TISSUE NON VASCULAR    Standing Status:   Future    Standing Expiration Date:   08/29/2018    Order Specific Question:   Reason for Exam (SYMPTOM  OR DIAGNOSIS REQUIRED)    Answer:   nodule posterior left deltoid, non-tender, possible onset after prolia injection    Order Specific Question:   Preferred imaging location?    Answer:   [facility]  . Comp Met (CMET)    Standing Status:   Future    Standing Expiration Date:   06/29/2018  . Vitamin D (25 hydroxy)    Standing Status:   Future    Standing Expiration Date:   06/29/2018    [name], MD [place]

## 2017-06-29 NOTE — Assessment & Plan Note (Signed)
Doing well with Prolia. Continue calcium and vitamin D. Check lab work in 2-1/2 months.

## 2017-06-29 NOTE — Assessment & Plan Note (Signed)
Could be related to the Prolia injection though could also be a lymph node. We will obtain an ultrasound. We'll have our RN report this to the Prolia representative.

## 2017-06-29 NOTE — Patient Instructions (Signed)
Nice to see you. We'll get an ultrasound of the nodule. We will have you return in 2.5 months for lab work. At that time we'll change your blood pressure medication.

## 2017-06-29 NOTE — Assessment & Plan Note (Signed)
Controlled.   -Continue current medication

## 2017-07-05 ENCOUNTER — Ambulatory Visit
Admission: RE | Admit: 2017-07-05 | Discharge: 2017-07-05 | Disposition: A | Discharge status: Home / Self Care | Payer: Medicare Other | Source: Ambulatory Visit | Attending: Family Medicine | Admitting: Family Medicine

## 2017-07-05 DIAGNOSIS — R2231 Localized swelling, mass and lump, right upper limb: Secondary | ICD-10-CM | POA: Diagnosis not present

## 2017-07-05 DIAGNOSIS — R229 Localized swelling, mass and lump, unspecified: Secondary | ICD-10-CM | POA: Diagnosis not present

## 2017-07-12 ENCOUNTER — Telehealth: Payer: Self-pay

## 2017-07-12 NOTE — Telephone Encounter (Signed)
Left a VM to return my call, to schedule and discuss Prolia.  I need to speak with her when she calls back

## 2017-07-29 ENCOUNTER — Ambulatory Visit (INDEPENDENT_AMBULATORY_CARE_PROVIDER_SITE_OTHER): Payer: Medicare Other | Admitting: *Deleted

## 2017-07-29 DIAGNOSIS — M8580 Other specified disorders of bone density and structure, unspecified site: Secondary | ICD-10-CM | POA: Diagnosis not present

## 2017-07-29 MED ORDER — DENOSUMAB 60 MG/ML ~~LOC~~ SOLN
60.0000 mg | Freq: Once | SUBCUTANEOUS | Status: AC
  Administered 2017-07-29: 60 mg via SUBCUTANEOUS

## 2017-07-29 NOTE — Progress Notes (Signed)
Noted. Thank you for checking.

## 2017-07-29 NOTE — Progress Notes (Signed)
Patient was to have lab work as well. It does not appear she had this done. Please contact her to set up a lab appointment. Thanks.

## 2017-07-29 NOTE — Progress Notes (Signed)
I have reviewed the above note and agree.  Eric Sonnenberg, M.D.  

## 2017-07-29 NOTE — Progress Notes (Signed)
Patient presented for Prolia injection to right arm subcutaneous , patient voiced no concerns nor showed any signs of distress.

## 2017-07-29 NOTE — Progress Notes (Signed)
Lab appointment is 09/08/17 per chart.

## 2017-09-07 DIAGNOSIS — N816 Rectocele: Secondary | ICD-10-CM | POA: Diagnosis not present

## 2017-09-07 DIAGNOSIS — Z4689 Encounter for fitting and adjustment of other specified devices: Secondary | ICD-10-CM | POA: Diagnosis not present

## 2017-09-07 DIAGNOSIS — N8110 Cystocele, unspecified: Secondary | ICD-10-CM | POA: Diagnosis not present

## 2017-09-08 ENCOUNTER — Other Ambulatory Visit: Payer: Self-pay

## 2017-09-21 DIAGNOSIS — I788 Other diseases of capillaries: Secondary | ICD-10-CM | POA: Diagnosis not present

## 2017-09-21 DIAGNOSIS — L814 Other melanin hyperpigmentation: Secondary | ICD-10-CM | POA: Diagnosis not present

## 2017-09-21 DIAGNOSIS — L718 Other rosacea: Secondary | ICD-10-CM | POA: Diagnosis not present

## 2017-09-21 DIAGNOSIS — L570 Actinic keratosis: Secondary | ICD-10-CM | POA: Diagnosis not present

## 2017-11-02 ENCOUNTER — Ambulatory Visit: Payer: Self-pay

## 2017-11-17 DIAGNOSIS — Z4689 Encounter for fitting and adjustment of other specified devices: Secondary | ICD-10-CM | POA: Diagnosis not present

## 2017-11-17 DIAGNOSIS — N816 Rectocele: Secondary | ICD-10-CM | POA: Diagnosis not present

## 2017-11-17 DIAGNOSIS — N8110 Cystocele, unspecified: Secondary | ICD-10-CM | POA: Diagnosis not present

## 2017-11-19 ENCOUNTER — Telehealth: Payer: Self-pay | Admitting: Family Medicine

## 2017-11-19 NOTE — Telephone Encounter (Signed)
Verification fro Prolia submitted this morning on [employer] portal.

## 2017-11-23 DIAGNOSIS — L821 Other seborrheic keratosis: Secondary | ICD-10-CM | POA: Diagnosis not present

## 2017-11-23 DIAGNOSIS — L718 Other rosacea: Secondary | ICD-10-CM | POA: Diagnosis not present

## 2017-11-23 DIAGNOSIS — L570 Actinic keratosis: Secondary | ICD-10-CM | POA: Diagnosis not present

## 2017-12-28 ENCOUNTER — Ambulatory Visit: Payer: Self-pay | Admitting: Family Medicine

## 2017-12-29 ENCOUNTER — Encounter: Payer: Self-pay | Admitting: Family Medicine

## 2017-12-29 ENCOUNTER — Ambulatory Visit: Payer: Medicare Other | Admitting: Family Medicine

## 2017-12-29 ENCOUNTER — Other Ambulatory Visit: Payer: Self-pay

## 2017-12-29 VITALS — BP 140/80 | HR 52 | Temp 97.8°F | Ht 63.0 in | Wt 136.8 lb

## 2017-12-29 DIAGNOSIS — Z1239 Encounter for other screening for malignant neoplasm of breast: Secondary | ICD-10-CM

## 2017-12-29 DIAGNOSIS — Z800 Family history of malignant neoplasm of digestive organs: Secondary | ICD-10-CM | POA: Diagnosis not present

## 2017-12-29 DIAGNOSIS — E663 Overweight: Secondary | ICD-10-CM | POA: Insufficient documentation

## 2017-12-29 DIAGNOSIS — R229 Localized swelling, mass and lump, unspecified: Secondary | ICD-10-CM

## 2017-12-29 DIAGNOSIS — R7303 Prediabetes: Secondary | ICD-10-CM | POA: Diagnosis not present

## 2017-12-29 DIAGNOSIS — I10 Essential (primary) hypertension: Secondary | ICD-10-CM

## 2017-12-29 DIAGNOSIS — E538 Deficiency of other specified B group vitamins: Secondary | ICD-10-CM | POA: Insufficient documentation

## 2017-12-29 DIAGNOSIS — F4321 Adjustment disorder with depressed mood: Secondary | ICD-10-CM

## 2017-12-29 DIAGNOSIS — M810 Age-related osteoporosis without current pathological fracture: Secondary | ICD-10-CM

## 2017-12-29 DIAGNOSIS — E785 Hyperlipidemia, unspecified: Secondary | ICD-10-CM | POA: Diagnosis not present

## 2017-12-29 DIAGNOSIS — Z1231 Encounter for screening mammogram for malignant neoplasm of breast: Secondary | ICD-10-CM

## 2017-12-29 LAB — LIPID PANEL
Cholesterol: 263 mg/dL — ABNORMAL HIGH (ref 0–200)
HDL: 63.2 mg/dL (ref >39.00)
LDL CALC: 169 mg/dL — AB (ref 0–99)
NonHDL: 199.54
Total CHOL/HDL Ratio: 4
Triglycerides: 153 mg/dL — ABNORMAL HIGH (ref 0.0–149.0)
VLDL: 30.6 mg/dL (ref 0.0–40.0)

## 2017-12-29 LAB — COMPREHENSIVE METABOLIC PANEL
ALBUMIN: 4.2 g/dL (ref 3.5–5.2)
ALT: 19 U/L (ref 0–35)
AST: 18 U/L (ref 0–37)
Alkaline Phosphatase: 56 U/L (ref 39–117)
BUN: 16 mg/dL (ref 6–23)
CHLORIDE: 103 meq/L (ref 96–112)
CO2: 26 meq/L (ref 19–32)
Calcium: 9.5 mg/dL (ref 8.4–10.5)
Creatinine, Ser: 0.73 mg/dL (ref 0.40–1.20)
GFR: 81.83 mL/min (ref >60.00)
Glucose, Bld: 92 mg/dL (ref 70–99)
POTASSIUM: 4 meq/L (ref 3.5–5.1)
Sodium: 137 mEq/L (ref 135–145)
Total Bilirubin: 0.5 mg/dL (ref 0.2–1.2)
Total Protein: 7.2 g/dL (ref 6.0–8.3)

## 2017-12-29 LAB — HEMOGLOBIN A1C: Hgb A1c MFr Bld: 5.7 % (ref 4.6–6.5)

## 2017-12-29 LAB — VITAMIN B12: VITAMIN B 12: 254 pg/mL (ref 211–911)

## 2017-12-29 LAB — VITAMIN D 25 HYDROXY (VIT D DEFICIENCY, FRACTURES): VITD: 39.19 ng/mL (ref 30.00–100.00)

## 2017-12-29 NOTE — Assessment & Plan Note (Signed)
Related to loss of husband.  Provided support.  She will let us know if she needs anything from [place] regarding this.

## 2017-12-29 NOTE — Assessment & Plan Note (Signed)
History in her brother.  Discussed possibly returning to GI for colonoscopy though she is hesitant to do this.  Discussed the risk that something could be missed.  She will let us know if she changes her mind.

## 2017-12-29 NOTE — Assessment & Plan Note (Signed)
She has been working on diet and exercise.  Continue that.  Check labs.

## 2017-12-29 NOTE — Assessment & Plan Note (Signed)
Found to be a lipoma or cyst on ultrasound.

## 2017-12-29 NOTE — Patient Instructions (Addendum)
Nice to see you. We will check lab work today and contact you with the results. Please let us know if you would like to see GI again to consider colon cancer screening.

## 2017-12-29 NOTE — Assessment & Plan Note (Signed)
Noted on prior lab work.  Plan to recheck today.

## 2017-12-29 NOTE — Assessment & Plan Note (Signed)
Was overweight.  She has changed her diet since her husband died and is eating healthier food and not quite as large quantities at her desire.  Suspect slight weight loss related to dietary changes.  No night sweats noted.  She has consciously made this change.  She will monitor her weight and diet.

## 2017-12-29 NOTE — Assessment & Plan Note (Signed)
Well controlled. Continue amlodipine 

## 2017-12-29 NOTE — Progress Notes (Signed)
[name], MD Phone: [phone]  Jane Willis is a 77 y.o. female who presents today for f/u.  HYPERTENSION  Disease Monitoring  Home BP Monitoring <140s/80s Chest pain- no    Dyspnea- no Medications  Compliance-  Taking amlodipine.  Edema- no  Grief: Notes her husband passed away last Sep 20, 2023.  She is been doing okay with this.  Does note some grief.  Some trouble adjusting.  No depression.  She has significant support from her sons.  She is thinking about going on a mission trip to [place].  Next  Osteoporosis: This is based on her frax score and history of low impact fracture.  Osteopenic by T score.  Currently on Prolia with no side effects.  Taking calcium and vitamin D.  She is been trying to eat more fruit and vegetables.  She has been staying active.  Family history of colon cancer in her brother.  Prior colonoscopy with no polyps.  She is hesitant to have another one.    Social History   Tobacco Use  Smoking Status Never Smoker  Smokeless Tobacco Never Used     ROS see history of present illness  Objective  Physical Exam Vitals:   12/29/17 0906  BP: 140/80  Pulse: (!) 52  Temp: 97.8 F (36.6 C)  SpO2: 99%    BP Readings from Last 3 Encounters:  12/29/17 140/80  06/29/17 140/82  03/19/17 132/82   Wt Readings from Last 3 Encounters:  12/29/17 136 lb 12.8 oz (62.1 kg)  06/29/17 144 lb 6.4 oz (65.5 kg)  03/19/17 146 lb 9.6 oz (66.5 kg)    Physical Exam  Constitutional: No distress.  Cardiovascular: Normal rate, regular rhythm and normal heart sounds.  Pulmonary/Chest: Effort normal and breath sounds normal.  Abdominal: Soft. Bowel sounds are normal. She exhibits no distension. There is no tenderness. There is no rebound and no guarding.  Musculoskeletal: She exhibits no edema.  Neurological: She is alert. Gait normal.  Skin: Skin is warm and dry. She is not diaphoretic.     Assessment/Plan: Please see individual problem  list.  Hypertension Well-controlled.  Continue amlodipine.  Hyperlipidemia She has been working on diet and exercise.  Continue that.  Check labs.  Subcutaneous nodule Found to be a lipoma or cyst on ultrasound.  Age-related osteoporosis without current pathological fracture Osteoporosis based on frax score as well as low impact fracture history.  Doing well on Prolia.  She will continue calcium and vitamin D.  Will check vitamin D level.  Family history of colon cancer History in her brother.  Discussed possibly returning to GI for colonoscopy though she is hesitant to do this.  Discussed the risk that something could be missed.  She will let us know if she changes her mind.  Grief Related to loss of husband.  Provided support.  She will let us know if she needs anything from [place] regarding this.  B12 deficiency Noted on prior lab work.  Plan to recheck today.  Overweight Was overweight.  She has changed her diet since her husband died and is eating healthier food and not quite as large quantities at her desire.  Suspect slight weight loss related to dietary changes.  No night sweats noted.  She has consciously made this change.  She will monitor her weight and diet.   Health Maintenance: Prevnar received about a year ago and brought up-to-date.  Orders Placed This Encounter  Procedures  . MM Digital Screening    Standing  Status:   Future    Standing Expiration Date:   03/01/2019    Order Specific Question:   Reason for Exam (SYMPTOM  OR DIAGNOSIS REQUIRED)    Answer:   screening    Order Specific Question:   Preferred imaging location?    Answer:   [facility]  . Lipid panel  . Comp Met (CMET)  . HgB A1c  . B12  . Vitamin D (25 hydroxy)    No orders of the defined types were placed in this encounter.    [name], MD [place]

## 2017-12-29 NOTE — Assessment & Plan Note (Signed)
Osteoporosis based on frax score as well as low impact fracture history.  Doing well on Prolia.  She will continue calcium and vitamin D.  Will check vitamin D level.

## 2017-12-30 ENCOUNTER — Ambulatory Visit: Payer: Self-pay | Admitting: Hematology

## 2017-12-30 NOTE — Telephone Encounter (Signed)
Patient calling back for lab results.  Not a true triage.  See result note from today.

## 2018-01-12 ENCOUNTER — Ambulatory Visit
Admission: RE | Admit: 2018-01-12 | Discharge: 2018-01-12 | Disposition: A | Discharge status: Home / Self Care | Payer: Medicare Other | Source: Ambulatory Visit | Attending: Family Medicine | Admitting: Family Medicine

## 2018-01-12 DIAGNOSIS — Z1239 Encounter for other screening for malignant neoplasm of breast: Secondary | ICD-10-CM

## 2018-01-12 DIAGNOSIS — Z1231 Encounter for screening mammogram for malignant neoplasm of breast: Secondary | ICD-10-CM | POA: Insufficient documentation

## 2018-01-18 ENCOUNTER — Telehealth: Payer: Self-pay | Admitting: Family Medicine

## 2018-01-18 NOTE — Telephone Encounter (Signed)
Insurance verification for Prolia filed on [employer] Portal. 

## 2018-01-25 DIAGNOSIS — D2339 Other benign neoplasm of skin of other parts of face: Secondary | ICD-10-CM | POA: Diagnosis not present

## 2018-01-25 DIAGNOSIS — D1801 Hemangioma of skin and subcutaneous tissue: Secondary | ICD-10-CM | POA: Diagnosis not present

## 2018-01-25 DIAGNOSIS — L718 Other rosacea: Secondary | ICD-10-CM | POA: Diagnosis not present

## 2018-02-02 ENCOUNTER — Ambulatory Visit (INDEPENDENT_AMBULATORY_CARE_PROVIDER_SITE_OTHER): Payer: Medicare Other | Admitting: *Deleted

## 2018-02-02 DIAGNOSIS — M8580 Other specified disorders of bone density and structure, unspecified site: Secondary | ICD-10-CM | POA: Diagnosis not present

## 2018-02-02 MED ORDER — DENOSUMAB 60 MG/ML ~~LOC~~ SOSY
60.0000 mg | PREFILLED_SYRINGE | Freq: Once | SUBCUTANEOUS | Status: AC
  Administered 2018-02-02: 60 mg via SUBCUTANEOUS

## 2018-02-02 NOTE — Progress Notes (Signed)
Patient presented for Prolia injection to Right arm [place], patient voiced no concerns or complaints during or after injection. 

## 2018-02-14 ENCOUNTER — Ambulatory Visit (INDEPENDENT_AMBULATORY_CARE_PROVIDER_SITE_OTHER): Payer: Medicare Other

## 2018-02-14 VITALS — BP 132/72 | HR 67 | Temp 97.7°F | Resp 14 | Ht 63.5 in | Wt 138.1 lb

## 2018-02-14 DIAGNOSIS — Z0000 Encounter for general adult medical examination without abnormal findings: Secondary | ICD-10-CM

## 2018-02-14 NOTE — Progress Notes (Signed)
Subjective:   Jane Willis is a 77 y.o. female who presents for Medicare Annual (Subsequent) preventive examination.  Review of Systems:  No ROS.  Medicare Wellness Visit. Additional risk factors are reflected in the social history. Cardiac Risk Factors include: advanced age (>23men, >26 women);hypertension     Objective:     Vitals: BP 132/72 (BP Location: Left Arm, Patient Position: Sitting, Cuff Size: Normal)   Pulse 67   Temp 97.7 F (36.5 C) (Oral)   Resp 14   Ht 5' 3.5" (1.613 m)   Wt 138 lb 1.9 oz (62.7 kg)   SpO2 98%   BMI 24.08 kg/m   Body mass index is 24.08 kg/m.  Advanced Directives 02/14/2018 02/11/2017  Does Patient Have a Medical Advance Directive? Yes Yes  Type of Paramedic of [place];Living will [place];Living will  Does patient want to make changes to medical advance directive? No - Patient declined No - Patient declined  Copy of [place] in Chart? No - copy requested No - copy requested    Tobacco Social History   Tobacco Use  Smoking Status Never Smoker  Smokeless Tobacco Never Used     Counseling given: Not Answered   Clinical Intake:  Pre-visit preparation completed: Yes  Pain : No/denies pain     Nutritional Status: BMI of 19-24  Normal Diabetes: No  How often do you need to have someone help you when you read instructions, pamphlets, or other written materials from your doctor or pharmacy?: 1 - Never  Interpreter Needed?: No     Past Medical History:  Diagnosis Date  . Allergy    Seasonal  . Arthritis   . Chicken pox   . Hyperlipidemia   . Hypertension   . Urinary incontinence    Past Surgical History:  Procedure Laterality Date  . ABDOMINAL HYSTERECTOMY  1986  . BREAST EXCISIONAL BIOPSY Left 1970's   benign  . BREAST SURGERY  1968  . CATARACT EXTRACTION  2014/2015   Both eyes  . FRACTURE SURGERY  01/2009   Fractured Left ankle, plate on outside  of ankle 2 rods through/across ankle from inside of ankle  . OOPHORECTOMY    . TONSILLECTOMY AND ADENOIDECTOMY     Family History  Problem Relation Age of Onset  . Arthritis Mother   . Hypertension Mother   . Arthritis Father   . Hypertension Father   . Arthritis Maternal Grandmother   . Cancer Brother        Colon  . Cancer Maternal Aunt        Breast Cancer  . Breast cancer Maternal Aunt   . Breast cancer Cousin   . Breast cancer Cousin   . Hyperlipidemia Son   . Hyperlipidemia Son    Social History   Socioeconomic History  . Marital status: Widowed    Spouse name: Not on file  . Number of children: Not on file  . Years of education: Not on file  . Highest education level: Not on file  Occupational History  . Not on file  Social Needs  . Financial resource strain: Not hard at all  . Food insecurity:    Worry: Never true    Inability: Never true  . Transportation needs:    Medical: No    Non-medical: No  Tobacco Use  . Smoking status: Never Smoker  . Smokeless tobacco: Never Used  Substance and Sexual Activity  . Alcohol use:  No    Alcohol/week: 0.0 oz  . Drug use: No  . Sexual activity: Never    Partners: Male  Lifestyle  . Physical activity:    Days per week: Not on file    Minutes per session: Not on file  . Stress: Not at all  Relationships  . Social connections:    Talks on phone: Twice a week    Gets together: Twice a week    Attends religious service: 1 to 4 times per year    Active member of club or organization: Yes    Attends meetings of clubs or organizations: 1 to 4 times per year    Relationship status: Widowed  Other Topics Concern  . Not on file  Social History Narrative   Widowed   Retired   Children 3   Pets: none   Caffeine- Coffee, Tea, rare soda    Outpatient Encounter Medications as of 02/14/2018  Medication Sig  . amLODipine (NORVASC) 10 MG tablet Take 1 tablet (10 mg total) by mouth daily.  Marland Kitchen aspirin 81 MG tablet Take 81  mg by mouth daily. Reported on 02/21/2016  . Cholecalciferol (VITAMIN D-3) 1000 UNITS CAPS Take 1,000 capsules by mouth 2 (two) times daily.  . Omega-3 Fatty Acids (FISH OIL) 1360 MG CAPS Take by mouth.  . vitamin C (ASCORBIC ACID) 500 MG tablet Take 500 mg by mouth daily.   Facility-Administered Encounter Medications as of 02/14/2018  Medication  . denosumab (PROLIA) injection 60 mg    Activities of Daily Living In your present state of health, do you have any difficulty performing the following activities: 02/14/2018  Hearing? N  Vision? N  Difficulty concentrating or making decisions? N  Walking or climbing stairs? N  Dressing or bathing? N  Doing errands, shopping? N  Preparing Food and eating ? N  Using the Toilet? N  In the past six months, have you accidently leaked urine? N  Do you have problems with loss of bowel control? N  Managing your Medications? N  Managing your Finances? N  Housekeeping or managing your Housekeeping? N  Some recent data might be hidden    Patient Care Team: Leone Haven, MD as PCP - General (Family Medicine)    Assessment:   This is a routine wellness examination for Veazie.  The goal of the wellness visit is to assist the patient how to close the gaps in care and create a preventative care plan for the patient.   The roster of all physicians providing medical care to patient is listed in the Snapshot section of the chart.  Taking calcium VIT D as appropriate/Osteoporosis risk reviewed.    Safety issues reviewed; Smoke and carbon monoxide detectors in the home. No firearms in the home. Wears seatbelts when driving or riding with others. No violence in the home.  They do not have excessive sun exposure.  Discussed the need for sun protection: hats, long sleeves and the use of sunscreen if there is significant sun exposure.  Patient is alert, normal appearance, oriented to person/place/and time.  Correctly identified the president of  the [place] and recalls of 3/3 words. Performs simple calculations and can read correct time from watch face. Displays appropriate judgement.  No new identified risk were noted.  No failures at ADL's or IADL's.    BMI- discussed the importance of a healthy diet, water intake and the benefits of aerobic exercise. Educational material provided.   24 hour diet recall: Low cholesterol  Dental- UTD  Sleep patterns- Sleeps 8 hours at night.    Health maintenance gaps- closed.  Patient Concerns: None at this time. Follow up with PCP as needed.  Exercise Activities and Dietary recommendations Current Exercise Habits: Home exercise routine, Type of exercise: walking, Time (Minutes): 20, Frequency (Times/Week): 4, Weekly Exercise (Minutes/Week): 80, Intensity: Mild  Goals    . Low cholesterol diet       Fall Risk Fall Risk  02/14/2018 02/11/2017 12/31/2016  Falls in the past year? No No No   Depression Screen PHQ 2/9 Scores 02/14/2018 02/11/2017 12/31/2016  PHQ - 2 Score 0 0 0  PHQ- 9 Score - 0 -     Cognitive Function MMSE - Mini Mental State Exam 02/14/2018 02/11/2017  Orientation to time 5 5  Orientation to Place 5 5  Registration 3 3  Attention/ Calculation 5 5  Recall 3 3  Language- name 2 objects 2 2  Language- repeat 1 1  Language- follow 3 step command 3 3  Language- read & follow direction 1 1  Write a sentence 1 1  Copy design 1 1  Total score 30 30        Immunization History  Administered Date(s) Administered  . Influenza, High Dose Seasonal PF 08/01/2016  . Pneumococcal-Unspecified 01/25/2012  . Tdap 01/21/2015   Screening Tests Health Maintenance  Topic Date Due  . INFLUENZA VACCINE  06/29/2018 (Originally 05/19/2018)  . TETANUS/TDAP  01/20/2025  . DEXA SCAN  Completed  . PNA vac Low Risk Adult  Completed      Plan:    End of life planning; Advance aging; Advanced directives discussed. Copy of current HCPOA/Living Will requested.    I have personally  reviewed and noted the following in the patient's chart:   . Medical and social history . Use of alcohol, tobacco or illicit drugs  . Current medications and supplements . Functional ability and status . Nutritional status . Physical activity . Advanced directives . List of other physicians . Hospitalizations, surgeries, and ER visits in previous 12 months . Vitals . Screenings to include cognitive, depression, and falls . Referrals and appointments  In addition, I have reviewed and discussed with patient certain preventive protocols, quality metrics, and best practice recommendations. A written personalized care plan for preventive services as well as general preventive health recommendations were provided to patient.     Varney Biles, LPN  4/[id number]

## 2018-02-14 NOTE — Patient Instructions (Addendum)
  Jane Willis , Thank you for taking time to come for your Medicare Wellness Visit. I appreciate your ongoing commitment to your health goals. Please review the following plan we discussed and let me know if I can assist you in the future.   Follow up as needed.    Bring a copy of your [place] and/or Living Will to be scanned into chart.  Have a great day!  These are the goals we discussed: Goals    . Low cholesterol diet       This is a list of the screening recommended for you and due dates:  Health Maintenance  Topic Date Due  . Flu Shot  06/29/2018*  . Tetanus Vaccine  01/20/2025  . DEXA scan (bone density measurement)  Completed  . Pneumonia vaccines  Completed  *Topic was postponed. The date shown is not the original due date.

## 2018-02-28 ENCOUNTER — Other Ambulatory Visit: Payer: Self-pay | Admitting: Family Medicine

## 2018-04-05 DIAGNOSIS — Z4689 Encounter for fitting and adjustment of other specified devices: Secondary | ICD-10-CM | POA: Diagnosis not present

## 2018-05-24 DIAGNOSIS — D179 Benign lipomatous neoplasm, unspecified: Secondary | ICD-10-CM | POA: Diagnosis not present

## 2018-05-24 DIAGNOSIS — L718 Other rosacea: Secondary | ICD-10-CM | POA: Diagnosis not present

## 2018-05-24 DIAGNOSIS — D1800 Hemangioma unspecified site: Secondary | ICD-10-CM | POA: Diagnosis not present

## 2018-05-24 DIAGNOSIS — L578 Other skin changes due to chronic exposure to nonionizing radiation: Secondary | ICD-10-CM | POA: Diagnosis not present

## 2018-05-24 DIAGNOSIS — L821 Other seborrheic keratosis: Secondary | ICD-10-CM | POA: Diagnosis not present

## 2018-07-06 ENCOUNTER — Encounter: Payer: Self-pay | Admitting: Family Medicine

## 2018-07-06 ENCOUNTER — Ambulatory Visit: Payer: Medicare Other | Admitting: Family Medicine

## 2018-07-06 VITALS — BP 140/80 | HR 58 | Temp 98.7°F | Ht 63.5 in | Wt 138.6 lb

## 2018-07-06 DIAGNOSIS — I10 Essential (primary) hypertension: Secondary | ICD-10-CM | POA: Diagnosis not present

## 2018-07-06 DIAGNOSIS — Z23 Encounter for immunization: Secondary | ICD-10-CM

## 2018-07-06 DIAGNOSIS — E785 Hyperlipidemia, unspecified: Secondary | ICD-10-CM | POA: Diagnosis not present

## 2018-07-06 DIAGNOSIS — R7303 Prediabetes: Secondary | ICD-10-CM | POA: Diagnosis not present

## 2018-07-06 DIAGNOSIS — E538 Deficiency of other specified B group vitamins: Secondary | ICD-10-CM

## 2018-07-06 DIAGNOSIS — R229 Localized swelling, mass and lump, unspecified: Secondary | ICD-10-CM

## 2018-07-06 DIAGNOSIS — M810 Age-related osteoporosis without current pathological fracture: Secondary | ICD-10-CM

## 2018-07-06 LAB — HEMOGLOBIN A1C: HEMOGLOBIN A1C: 5.9 % (ref 4.6–6.5)

## 2018-07-06 LAB — COMPREHENSIVE METABOLIC PANEL
ALT: 12 U/L (ref 0–35)
AST: 17 U/L (ref 0–37)
Albumin: 4 g/dL (ref 3.5–5.2)
Alkaline Phosphatase: 50 U/L (ref 39–117)
BUN: 15 mg/dL (ref 6–23)
CO2: 28 mEq/L (ref 19–32)
Calcium: 8.9 mg/dL (ref 8.4–10.5)
Chloride: 104 mEq/L (ref 96–112)
Creatinine, Ser: 0.74 mg/dL (ref 0.40–1.20)
GFR: 80.45 mL/min (ref >60.00)
Glucose, Bld: 87 mg/dL (ref 70–99)
POTASSIUM: 4 meq/L (ref 3.5–5.1)
SODIUM: 136 meq/L (ref 135–145)
TOTAL PROTEIN: 6.9 g/dL (ref 6.0–8.3)
Total Bilirubin: 0.4 mg/dL (ref 0.2–1.2)

## 2018-07-06 LAB — VITAMIN B12: Vitamin B-12: 157 pg/mL — ABNORMAL LOW (ref 211–911)

## 2018-07-06 LAB — LDL CHOLESTEROL, DIRECT: LDL DIRECT: 146 mg/dL

## 2018-07-06 MED ORDER — AMLODIPINE BESYLATE 10 MG PO TABS
ORAL_TABLET | ORAL | 3 refills | Status: DC
Start: 2018-07-06 — End: 2019-08-01

## 2018-07-06 NOTE — Assessment & Plan Note (Signed)
She is due for DEXA scan.  This will be ordered.  She will continue Prolia.

## 2018-07-06 NOTE — Addendum Note (Signed)
Addended by: Caryl Bis, [place] Grosser G on: 07/06/2018 09:30 AM   Modules accepted: Orders

## 2018-07-06 NOTE — Assessment & Plan Note (Signed)
Noted on prior A1c.  Recheck today.  Continue diet and exercise.

## 2018-07-06 NOTE — Progress Notes (Addendum)
  [name], MD Phone: [phone]  Jane Willis is a 77 y.o. female who presents today for f/u.  CC: htn, hld, b12 deficiency, prediabetes, subcutaneous nodule  HYPERTENSION  Disease Monitoring  Home BP Monitoring 126-136/76-80 Chest pain- no    Dyspnea- no Medications  Compliance-  Taking amlodipine. Lightheadedness-  no  Edema- no  Hyperlipidemia: Taking omega-3.  She eats very healthy with low-fat foods.  Does eat some nuts.  She stays active though no specific exercise.  No claudication.  Prediabetes: Working on her diet.  No polyuria or polydipsia.  Due for A1c.  B12 deficiency: Minimally deficient in the past though was in the normal range most recently.  She is not on a supplement.  Due for recheck.  Subcutaneous nodule: Located near left deltoid.  Found to be a cyst or lipoma on ultrasound.  She notes this has not changed.  Very rarely it will be slightly tender though it goes away quickly.    Social History   Tobacco Use  Smoking Status Never Smoker  Smokeless Tobacco Never Used     ROS see history of present illness  Objective  Physical Exam Vitals:   07/06/18 0852  BP: 140/80  Pulse: (!) 58  Temp: 98.7 F (37.1 C)  SpO2: 98%    BP Readings from Last 3 Encounters:  07/06/18 140/80  02/14/18 132/72  12/29/17 140/80   Wt Readings from Last 3 Encounters:  07/06/18 138 lb 9.6 oz (62.9 kg)  02/14/18 138 lb 1.9 oz (62.7 kg)  12/29/17 136 lb 12.8 oz (62.1 kg)    Physical Exam  Constitutional: No distress.  Cardiovascular: Normal rate, regular rhythm and normal heart sounds.  Pulmonary/Chest: Effort normal and breath sounds normal.  Musculoskeletal: She exhibits no edema.  Neurological: She is alert.  Skin: Skin is warm and dry. She is not diaphoretic.  I am unavailable to palpate the prior subcutaneous nodule today     Assessment/Plan: Please see individual problem list.  Hypertension Well-controlled for age.  Continue amlodipine.   Check lab work today.  B12 deficiency Recheck B12 today.  Hyperlipidemia Check LDL today.  Continue omega-3 and diet and exercise.  Subcutaneous nodule Patient will monitor this.  If it changes she will let us know.  Prediabetes Noted on prior A1c.  Recheck today.  Continue diet and exercise.  Age-related osteoporosis without current pathological fracture She is due for DEXA scan.  This will be ordered.  She will continue Prolia.   Orders Placed This Encounter  Procedures  . DG Bone Density    Standing Status:   Future    Standing Expiration Date:   09/06/2019    Order Specific Question:   Reason for Exam (SYMPTOM  OR DIAGNOSIS REQUIRED)    Answer:   osteoporosis follow-up, on prolia    Order Specific Question:   Preferred imaging location?    Answer:   [facility]  . Flu vaccine HIGH DOSE PF (Fluzone High dose)  . B12  . HgB A1c  . Direct LDL  . Comp Met (CMET)    Meds ordered this encounter  Medications  . amLODipine (NORVASC) 10 MG tablet    Sig: TAKE 1 TABLET(10 MG) BY MOUTH DAILY    Dispense:  90 tablet    Refill:  3     [name], MD [place]

## 2018-07-06 NOTE — Assessment & Plan Note (Signed)
Well-controlled for age.  Continue amlodipine.  Check lab work today.

## 2018-07-06 NOTE — Assessment & Plan Note (Signed)
Patient will monitor this.  If it changes she will let us know.

## 2018-07-06 NOTE — Assessment & Plan Note (Signed)
Recheck B12 today.   

## 2018-07-06 NOTE — Patient Instructions (Signed)
Nice to see you. Please continue to periodically monitor your blood pressure. We will check lab work today and contact you with the results. Please continue to eat healthy and stay active.

## 2018-07-06 NOTE — Assessment & Plan Note (Signed)
Check LDL today.  Continue omega-3 and diet and exercise.

## 2018-07-18 ENCOUNTER — Telehealth: Payer: Self-pay | Admitting: Family Medicine

## 2018-07-18 NOTE — Telephone Encounter (Signed)
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 Patient ready for scheduled $ 40 co-pat and 20% of injection.

## 2018-07-27 ENCOUNTER — Ambulatory Visit (INDEPENDENT_AMBULATORY_CARE_PROVIDER_SITE_OTHER): Payer: Medicare Other

## 2018-07-27 DIAGNOSIS — E538 Deficiency of other specified B group vitamins: Secondary | ICD-10-CM

## 2018-07-27 MED ORDER — CYANOCOBALAMIN 1000 MCG/ML IJ SOLN
1000.0000 ug | Freq: Once | INTRAMUSCULAR | Status: AC
  Administered 2018-07-27: 1000 ug via INTRAMUSCULAR

## 2018-07-27 NOTE — Progress Notes (Signed)
Patient presented for B12 injection to Left arm IM, patient voiced no concerns or complaints during or after injection.

## 2018-07-27 NOTE — Progress Notes (Signed)
I have reviewed the above note and agree.  Ashanti Ratti, M.D.  

## 2018-08-03 ENCOUNTER — Ambulatory Visit (INDEPENDENT_AMBULATORY_CARE_PROVIDER_SITE_OTHER): Payer: Medicare Other | Admitting: *Deleted

## 2018-08-03 DIAGNOSIS — E538 Deficiency of other specified B group vitamins: Secondary | ICD-10-CM

## 2018-08-03 MED ORDER — CYANOCOBALAMIN 1000 MCG/ML IJ SOLN
1000.0000 ug | Freq: Once | INTRAMUSCULAR | Status: AC
  Administered 2018-08-03: 1000 ug via INTRAMUSCULAR

## 2018-08-03 NOTE — Progress Notes (Signed)
Patient presented for B 12 injection to left deltoid, patient voiced no concerns nor showed any signs of distress during injection. 

## 2018-08-10 ENCOUNTER — Ambulatory Visit (INDEPENDENT_AMBULATORY_CARE_PROVIDER_SITE_OTHER): Payer: Medicare Other | Admitting: Lab

## 2018-08-10 DIAGNOSIS — M810 Age-related osteoporosis without current pathological fracture: Secondary | ICD-10-CM

## 2018-08-10 DIAGNOSIS — E538 Deficiency of other specified B group vitamins: Secondary | ICD-10-CM

## 2018-08-10 MED ORDER — DENOSUMAB 60 MG/ML ~~LOC~~ SOSY
60.0000 mg | PREFILLED_SYRINGE | Freq: Once | SUBCUTANEOUS | Status: AC
  Administered 2018-08-10: 60 mg via SUBCUTANEOUS

## 2018-08-10 MED ORDER — CYANOCOBALAMIN 1000 MCG/ML IJ SOLN
1000.0000 ug | Freq: Once | INTRAMUSCULAR | Status: AC
  Administered 2018-08-10: 1000 ug via INTRAMUSCULAR

## 2018-08-10 NOTE — Progress Notes (Signed)
  Pt was here today for Vit B-12, and Prolia. Pt tolerated well.

## 2018-08-23 ENCOUNTER — Encounter: Payer: Self-pay | Admitting: Family Medicine

## 2018-09-07 ENCOUNTER — Ambulatory Visit
Admission: RE | Admit: 2018-09-07 | Discharge: 2018-09-07 | Disposition: A | Discharge status: Home / Self Care | Payer: Medicare Other | Source: Ambulatory Visit | Attending: Family Medicine | Admitting: Family Medicine

## 2018-09-07 DIAGNOSIS — M810 Age-related osteoporosis without current pathological fracture: Secondary | ICD-10-CM

## 2018-09-07 DIAGNOSIS — M8589 Other specified disorders of bone density and structure, multiple sites: Secondary | ICD-10-CM | POA: Diagnosis not present

## 2018-09-07 DIAGNOSIS — Z780 Asymptomatic menopausal state: Secondary | ICD-10-CM | POA: Diagnosis not present

## 2018-09-08 ENCOUNTER — Other Ambulatory Visit: Payer: Self-pay | Admitting: Family Medicine

## 2018-09-08 DIAGNOSIS — M810 Age-related osteoporosis without current pathological fracture: Secondary | ICD-10-CM

## 2018-09-13 ENCOUNTER — Ambulatory Visit (INDEPENDENT_AMBULATORY_CARE_PROVIDER_SITE_OTHER): Payer: Medicare Other | Admitting: *Deleted

## 2018-09-13 DIAGNOSIS — E538 Deficiency of other specified B group vitamins: Secondary | ICD-10-CM | POA: Diagnosis not present

## 2018-09-13 MED ORDER — CYANOCOBALAMIN 1000 MCG/ML IJ SOLN
1000.0000 ug | Freq: Once | INTRAMUSCULAR | Status: AC
  Administered 2018-09-13: 1000 ug via INTRAMUSCULAR

## 2018-09-13 NOTE — Progress Notes (Signed)
Patient presented for B 12 injection to left deltoid, patient voiced no concerns nor showed any signs of distress during injection. 

## 2018-10-13 ENCOUNTER — Ambulatory Visit (INDEPENDENT_AMBULATORY_CARE_PROVIDER_SITE_OTHER): Payer: Medicare Other

## 2018-10-13 DIAGNOSIS — E538 Deficiency of other specified B group vitamins: Secondary | ICD-10-CM | POA: Diagnosis not present

## 2018-10-13 MED ORDER — CYANOCOBALAMIN 1000 MCG/ML IJ SOLN
1000.0000 ug | Freq: Once | INTRAMUSCULAR | Status: AC
  Administered 2018-10-13: 1000 ug via INTRAMUSCULAR

## 2018-10-13 NOTE — Progress Notes (Signed)
Patient presents today for a b12 injection. Right deltoid. Patient voiced no concerns nor showed any signs of distress during injection.

## 2018-10-13 NOTE — Progress Notes (Signed)
I have reviewed the above note and agree.  Kynzley Dowson, M.D.  

## 2018-10-27 DIAGNOSIS — N8110 Cystocele, unspecified: Secondary | ICD-10-CM | POA: Diagnosis not present

## 2018-12-06 DIAGNOSIS — M810 Age-related osteoporosis without current pathological fracture: Secondary | ICD-10-CM | POA: Diagnosis not present

## 2018-12-28 DIAGNOSIS — M810 Age-related osteoporosis without current pathological fracture: Secondary | ICD-10-CM | POA: Diagnosis not present

## 2019-02-17 ENCOUNTER — Ambulatory Visit: Payer: Self-pay | Admitting: Family Medicine

## 2019-02-17 ENCOUNTER — Ambulatory Visit: Payer: Self-pay

## 2019-03-01 ENCOUNTER — Other Ambulatory Visit: Payer: Self-pay

## 2019-03-01 ENCOUNTER — Ambulatory Visit (INDEPENDENT_AMBULATORY_CARE_PROVIDER_SITE_OTHER): Payer: Medicare Other

## 2019-03-01 ENCOUNTER — Ambulatory Visit (INDEPENDENT_AMBULATORY_CARE_PROVIDER_SITE_OTHER): Payer: Medicare Other | Admitting: Family Medicine

## 2019-03-01 ENCOUNTER — Telehealth: Payer: Self-pay | Admitting: Family Medicine

## 2019-03-01 DIAGNOSIS — I10 Essential (primary) hypertension: Secondary | ICD-10-CM

## 2019-03-01 DIAGNOSIS — E785 Hyperlipidemia, unspecified: Secondary | ICD-10-CM | POA: Diagnosis not present

## 2019-03-01 DIAGNOSIS — Z0000 Encounter for general adult medical examination without abnormal findings: Secondary | ICD-10-CM | POA: Diagnosis not present

## 2019-03-01 DIAGNOSIS — R7303 Prediabetes: Secondary | ICD-10-CM | POA: Diagnosis not present

## 2019-03-01 DIAGNOSIS — M810 Age-related osteoporosis without current pathological fracture: Secondary | ICD-10-CM | POA: Diagnosis not present

## 2019-03-01 NOTE — Assessment & Plan Note (Signed)
Check A1c. 

## 2019-03-01 NOTE — Telephone Encounter (Signed)
Please contact the patient and get her scheduled for follow-up in 6 months.  Please get her scheduled for labs in 6 to 8 weeks.

## 2019-03-01 NOTE — Assessment & Plan Note (Signed)
Reports this is well controlled.  She will continue current medication.

## 2019-03-01 NOTE — Assessment & Plan Note (Signed)
She will continue to see endocrinology and continue yearly Reclast.  I discussed that it is difficult to know whether or not this would be more beneficial to her osteoporosis then the Prolia was.

## 2019-03-01 NOTE — Assessment & Plan Note (Signed)
Continue omega-3.  She will come in for labs in 6 to 8 weeks.

## 2019-03-01 NOTE — Patient Instructions (Addendum)
  Jane Willis , Thank you for taking time to come for your Medicare Wellness Visit. I appreciate your ongoing commitment to your health goals. Please review the following plan we discussed and let me know if I can assist you in the future.   These are the goals we discussed: Goals    . DIET - INCREASE WATER INTAKE     Low cholesterol diet    . Increase physical activity     Walk for exercise       This is a list of the screening recommended for you and due dates:  Health Maintenance  Topic Date Due  . Flu Shot  05/20/2019  . Tetanus Vaccine  01/20/2025  . DEXA scan (bone density measurement)  Completed  . Pneumonia vaccines  Completed

## 2019-03-01 NOTE — Progress Notes (Signed)
Virtual Visit via telephone Note  This visit type was conducted due to national recommendations for restrictions regarding the COVID-19 pandemic (e.g. social distancing).  This format is felt to be most appropriate for this patient at this time.  All issues noted in this document were discussed and addressed.  No physical exam was performed (except for noted visual exam findings with Video Visits).   I connected with Terance Hart today at  1:45 PM EDT by telephone and verified that I am speaking with the correct person using two identifiers. Location patient: home Location provider: work Persons participating in the virtual visit: patient, provider  I discussed the limitations, risks, security and privacy concerns of performing an evaluation and management service by telephone and the availability of in person appointments. I also discussed with the patient that there may be a patient responsible charge related to this service. The patient expressed understanding and agreed to proceed.  Interactive audio and video telecommunications were attempted between this provider and patient, however failed, due to patient having technical difficulties OR patient did not have access to video capability.  We continued and completed visit with audio only.  Reason for visit: Follow-up  HPI: Hypertension: Stable BPs at home.  No elevations.  Taking amlodipine.  No chest pain or shortness of breath.  Chronic left ankle edema related to arthritis and a plate that was placed previously.  Hyperlipidemia: Taking omega-3.  She tries to stay active physically.  She does eat healthy and eats eggbeaters and vegetables as well as chicken and fish.  Osteoporosis: She is currently on Reclast.  She saw endocrinology after her most recent DEXA scan did not show any improvement after 2 years of Prolia.  The patient opted to switch to Reclast.  She notes no fracture.   ROS: See pertinent positives and negatives per  HPI.  Past Medical History:  Diagnosis Date  . Allergy    Seasonal  . Arthritis   . Chicken pox   . Hyperlipidemia   . Hypertension   . Urinary incontinence     Past Surgical History:  Procedure Laterality Date  . ABDOMINAL HYSTERECTOMY  1986  . BREAST EXCISIONAL BIOPSY Left 1970's   benign  . BREAST SURGERY  1968  . CATARACT EXTRACTION  2014/2015   Both eyes  . FRACTURE SURGERY  01/2009   Fractured Left ankle, plate on outside of ankle 2 rods through/across ankle from inside of ankle  . OOPHORECTOMY    . TONSILLECTOMY AND ADENOIDECTOMY      Family History  Problem Relation Age of Onset  . Arthritis Mother   . Hypertension Mother   . Arthritis Father   . Hypertension Father   . Arthritis Maternal Grandmother   . Cancer Brother        Colon  . Cancer Maternal Aunt        Breast Cancer  . Breast cancer Maternal Aunt   . Breast cancer Cousin   . Breast cancer Cousin   . Hyperlipidemia Son   . Hyperlipidemia Son     SOCIAL HX: Non-smoker.   Current Outpatient Medications:  .  amLODipine (NORVASC) 10 MG tablet, TAKE 1 TABLET(10 MG) BY MOUTH DAILY, Disp: 90 tablet, Rfl: 3 .  aspirin 81 MG tablet, Take 81 mg by mouth daily. Reported on 02/21/2016, Disp: , Rfl:  .  Cholecalciferol (VITAMIN D-3) 1000 UNITS CAPS, Take 1,000 capsules by mouth 2 (two) times daily., Disp: , Rfl:  .  Omega-3 Fatty Acids (  FISH OIL) 1360 MG CAPS, Take by mouth., Disp: , Rfl:  .  vitamin C (ASCORBIC ACID) 500 MG tablet, Take 500 mg by mouth daily., Disp: , Rfl:   EXAM: This is a telehealth telephone visit and thus no physical exam was completed.  ASSESSMENT AND PLAN:  Discussed the following assessment and plan:  Essential hypertension - Plan: Comp Met (CMET)  Age-related osteoporosis without current pathological fracture  Hyperlipidemia, unspecified hyperlipidemia type - Plan: Lipid panel  Prediabetes - Plan: Hemoglobin A1c  Hypertension Reports this is well controlled.  She will  continue current medication.  Age-related osteoporosis without current pathological fracture She will continue to see endocrinology and continue yearly Reclast.  I discussed that it is difficult to know whether or not this would be more beneficial to her osteoporosis then the Prolia was.  Hyperlipidemia Continue omega-3.  She will come in for labs in 6 to 8 weeks.  Prediabetes Check A1c.  CMA will contact patient to schedule follow-up in 6 months.  Lab work in 6 to 8 weeks.  Social distancing precautions and sick precautions given regarding COVID-19.   I discussed the assessment and treatment plan with the patient. The patient was provided an opportunity to ask questions and all were answered. The patient agreed with the plan and demonstrated an understanding of the instructions.   The patient was advised to call back or seek an in-person evaluation if the symptoms worsen or if the condition fails to improve as anticipated.  I provided 20 minutes of non-face-to-face time during this encounter.   [name], MD

## 2019-03-01 NOTE — Progress Notes (Signed)
Subjective:   Jane Willis is a 78 y.o. female who presents for Medicare Annual (Subsequent) preventive examination.  Review of Systems:  No ROS.  Medicare Wellness Virtual Visit.  Visual/audio telehealth visit, UTA vital signs.   See social history for additional risk factors.  Cardiac Risk Factors include: advanced age (>25men, >49 women);hypertension     Objective:     Vitals: There were no vitals taken for this visit.  There is no height or weight on file to calculate BMI.  Advanced Directives 03/01/2019 02/14/2018 02/11/2017  Does Patient Have a Medical Advance Directive? Yes Yes Yes  Type of Advance Directive Living will;Healthcare Power of [place];Living will [place];Living will  Does patient want to make changes to medical advance directive? No - Patient declined No - Patient declined No - Patient declined  Copy of [place] in Chart? No - copy requested No - copy requested No - copy requested    Tobacco Social History   Tobacco Use  Smoking Status Never Smoker  Smokeless Tobacco Never Used     Counseling given: Not Answered   Clinical Intake:  Pre-visit preparation completed: Yes        Diabetes: No  How often do you need to have someone help you when you read instructions, pamphlets, or other written materials from your doctor or pharmacy?: 1 - Never  Interpreter Needed?: No     Past Medical History:  Diagnosis Date  . Allergy    Seasonal  . Arthritis   . Chicken pox   . Hyperlipidemia   . Hypertension   . Urinary incontinence    Past Surgical History:  Procedure Laterality Date  . ABDOMINAL HYSTERECTOMY  1986  . BREAST EXCISIONAL BIOPSY Left 1970's   benign  . BREAST SURGERY  1968  . CATARACT EXTRACTION  2014/2015   Both eyes  . FRACTURE SURGERY  01/2009   Fractured Left ankle, plate on outside of ankle 2 rods through/across ankle from inside of ankle  .  OOPHORECTOMY    . TONSILLECTOMY AND ADENOIDECTOMY     Family History  Problem Relation Age of Onset  . Arthritis Mother   . Hypertension Mother   . Arthritis Father   . Hypertension Father   . Arthritis Maternal Grandmother   . Cancer Brother        Colon  . Cancer Maternal Aunt        Breast Cancer  . Breast cancer Maternal Aunt   . Breast cancer Cousin   . Breast cancer Cousin   . Hyperlipidemia Son   . Hyperlipidemia Son    Social History   Socioeconomic History  . Marital status: Widowed    Spouse name: Not on file  . Number of children: Not on file  . Years of education: Not on file  . Highest education level: Not on file  Occupational History  . Not on file  Social Needs  . Financial resource strain: Not hard at all  . Food insecurity:    Worry: Never true    Inability: Never true  . Transportation needs:    Medical: No    Non-medical: No  Tobacco Use  . Smoking status: Never Smoker  . Smokeless tobacco: Never Used  Substance and Sexual Activity  . Alcohol use: No    Alcohol/week: 0.0 standard drinks  . Drug use: No  . Sexual activity: Never    Partners: Male  Lifestyle  .  Physical activity:    Days per week: Not on file    Minutes per session: Not on file  . Stress: Not at all  Relationships  . Social connections:    Talks on phone: Twice a week    Gets together: Twice a week    Attends religious service: 1 to 4 times per year    Active member of club or organization: Yes    Attends meetings of clubs or organizations: 1 to 4 times per year    Relationship status: Widowed  Other Topics Concern  . Not on file  Social History Narrative   Widowed   Retired   Children 3   Pets: none   Caffeine- Coffee, Tea, rare soda    Outpatient Encounter Medications as of 03/01/2019  Medication Sig  . amLODipine (NORVASC) 10 MG tablet TAKE 1 TABLET(10 MG) BY MOUTH DAILY  . aspirin 81 MG tablet Take 81 mg by mouth daily. Reported on 02/21/2016  .  Cholecalciferol (VITAMIN D-3) 1000 UNITS CAPS Take 1,000 capsules by mouth 2 (two) times daily.  . Omega-3 Fatty Acids (FISH OIL) 1360 MG CAPS Take by mouth.  . vitamin C (ASCORBIC ACID) 500 MG tablet Take 500 mg by mouth daily.   No facility-administered encounter medications on file as of 03/01/2019.     Activities of Daily Living In your present state of health, do you have any difficulty performing the following activities: 03/01/2019  Hearing? N  Vision? N  Difficulty concentrating or making decisions? N  Walking or climbing stairs? N  Dressing or bathing? N  Doing errands, shopping? N  Preparing Food and eating ? N  Using the Toilet? N  In the past six months, have you accidently leaked urine? N  Do you have problems with loss of bowel control? N  Managing your Medications? N  Managing your Finances? N  Housekeeping or managing your Housekeeping? N  Some recent data might be hidden    Patient Care Team: Leone Haven, MD as PCP - General (Family Medicine)    Assessment:   This is a routine wellness examination for [name].  I connected with patient 03/01/19 at 12:00 PM EDT by an audio enabled telemedicine application and verified that I am speaking with the correct person using two identifiers. Patient stated full name and DOB. Patient gave permission to continue with virtual visit. Patient's location was at home and Nurse's location was at [place] office.   Health Screenings  Mammogram - 12/2017 Colonoscopy - 02/2012 Bone Density - 08/2018 Glaucoma -none Hearing -demonstrates normal hearing during visit. Hemoglobin A1C - 06/2018 (5.9) Cholesterol - 12/2017 (263) Direct LDL -06/2018 (146.0) Dental- visits every 6 months Vision- visits within the last 12 months.  Social  Alcohol intake - no       Smoking history- never    Smokers in home? none Illicit drug use? none Exercise - active around and outside the home. No exercise regimen. Walking encouraged.  Diet -  regular Sexually Active -never BMI- discussed the importance of a healthy diet, water intake and the benefits of aerobic exercise.  Educational material provided.   Safety  Patient feels safe at home- yes Patient does have smoke detectors at home- yes Patient does wear sunscreen or protective clothing when in direct sunlight -yes Patient does wear seat belt when in a moving vehicle -yes  Covid-19 precautions and sickness symptoms discussed.   Activities of Daily Living Patient denies needing assistance with: driving, household chores, feeding  themselves, getting from bed to chair, getting to the toilet, bathing/showering, dressing, managing money, or preparing meals.  No new identified risk were noted.    Depression Screen Patient denies losing interest in daily life, feeling hopeless, or crying easily over simple problems.   Medication-taking as directed and without issues.   Fall Screen Patient denies being afraid of falling or falling in the last year.   Memory Screen Patient is alert.  Patient denies difficulty focusing, concentrating or misplacing items. Correctly identified the president of the [place], season and recall. Patient likes to read, plays piano, and word puzzles for brain stimulation.  Immunizations The following Immunizations were discussed: Influenza, shingles, pneumonia, and tetanus.   Other Providers Patient Care Team: Leone Haven, MD as PCP - General (Family Medicine)  Exercise Activities and Dietary recommendations    Goals    . DIET - INCREASE WATER INTAKE     Low cholesterol diet    . Increase physical activity     Walk for exercise       Fall Risk Fall Risk  03/01/2019 02/14/2018 02/11/2017 12/31/2016  Falls in the past year? 0 No No No   Depression Screen PHQ 2/9 Scores 03/01/2019 02/14/2018 02/11/2017 12/31/2016  PHQ - 2 Score 0 0 0 0  PHQ- 9 Score - - 0 -     Cognitive Function MMSE - Mini Mental State Exam 02/14/2018 02/11/2017   Orientation to time 5 5  Orientation to Place 5 5  Registration 3 3  Attention/ Calculation 5 5  Recall 3 3  Language- name 2 objects 2 2  Language- repeat 1 1  Language- follow 3 step command 3 3  Language- read & follow direction 1 1  Write a sentence 1 1  Copy design 1 1  Total score 30 30     6CIT Screen 03/01/2019  What Year? 0 points  What month? 0 points  What time? 0 points  Count back from 20 0 points  Months in reverse 0 points  Repeat phrase 0 points  Total Score 0    Immunization History  Administered Date(s) Administered  . Influenza, High Dose Seasonal PF 08/01/2016, 07/06/2018  . Pneumococcal-Unspecified 01/25/2012  . Tdap 01/21/2015   Screening Tests Health Maintenance  Topic Date Due  . INFLUENZA VACCINE  05/20/2019  . TETANUS/TDAP  01/20/2025  . DEXA SCAN  Completed  . PNA vac Low Risk Adult  Completed      Plan:    End of life planning; Advance aging; Advanced directives discussed.  Copy of current HCPOA/Living Will requested.    I have personally reviewed and noted the following in the patient's chart:   . Medical and social history . Use of alcohol, tobacco or illicit drugs  . Current medications and supplements . Functional ability and status . Nutritional status . Physical activity . Advanced directives . List of other physicians . Hospitalizations, surgeries, and ER visits in previous 12 months . Vitals . Screenings to include cognitive, depression, and falls . Referrals and appointments  In addition, I have reviewed and discussed with patient certain preventive protocols, quality metrics, and best practice recommendations. A written personalized care plan for preventive services as well as general preventive health recommendations were provided to patient.     Varney Biles, LPN  3/71/0626

## 2019-03-02 NOTE — Progress Notes (Signed)
I have reviewed the above note and agree.  Trameka Dorough, M.D.  

## 2019-04-14 ENCOUNTER — Other Ambulatory Visit (INDEPENDENT_AMBULATORY_CARE_PROVIDER_SITE_OTHER): Payer: Medicare Other

## 2019-04-14 ENCOUNTER — Other Ambulatory Visit: Payer: Self-pay

## 2019-04-14 DIAGNOSIS — I10 Essential (primary) hypertension: Secondary | ICD-10-CM

## 2019-04-14 DIAGNOSIS — E785 Hyperlipidemia, unspecified: Secondary | ICD-10-CM | POA: Diagnosis not present

## 2019-04-14 DIAGNOSIS — R7303 Prediabetes: Secondary | ICD-10-CM

## 2019-04-14 LAB — COMPREHENSIVE METABOLIC PANEL
ALT: 9 U/L (ref 0–35)
AST: 14 U/L (ref 0–37)
Albumin: 3.8 g/dL (ref 3.5–5.2)
Alkaline Phosphatase: 54 U/L (ref 39–117)
BUN: 12 mg/dL (ref 6–23)
CO2: 28 mEq/L (ref 19–32)
Calcium: 9.3 mg/dL (ref 8.4–10.5)
Chloride: 103 mEq/L (ref 96–112)
Creatinine, Ser: 0.73 mg/dL (ref 0.40–1.20)
GFR: 76.74 mL/min (ref >60.00)
Glucose, Bld: 83 mg/dL (ref 70–99)
Potassium: 4.1 mEq/L (ref 3.5–5.1)
Sodium: 138 mEq/L (ref 135–145)
Total Bilirubin: 0.4 mg/dL (ref 0.2–1.2)
Total Protein: 6.4 g/dL (ref 6.0–8.3)

## 2019-04-14 LAB — LIPID PANEL
Cholesterol: 212 mg/dL — ABNORMAL HIGH (ref 0–200)
HDL: 54.9 mg/dL (ref >39.00)
LDL Cholesterol: 131 mg/dL — ABNORMAL HIGH (ref 0–99)
NonHDL: 156.91
Total CHOL/HDL Ratio: 4
Triglycerides: 130 mg/dL (ref 0.0–149.0)
VLDL: 26 mg/dL (ref 0.0–40.0)

## 2019-04-14 LAB — HEMOGLOBIN A1C: Hgb A1c MFr Bld: 5.8 % (ref 4.6–6.5)

## 2019-07-31 ENCOUNTER — Other Ambulatory Visit: Payer: Self-pay | Admitting: Family Medicine

## 2019-08-25 ENCOUNTER — Other Ambulatory Visit: Payer: Self-pay

## 2019-08-29 ENCOUNTER — Other Ambulatory Visit: Payer: Self-pay

## 2019-08-29 ENCOUNTER — Ambulatory Visit (INDEPENDENT_AMBULATORY_CARE_PROVIDER_SITE_OTHER): Payer: Medicare Other | Admitting: Family Medicine

## 2019-08-29 ENCOUNTER — Encounter: Payer: Self-pay | Admitting: Family Medicine

## 2019-08-29 VITALS — BP 160/70 | HR 63 | Temp 97.4°F | Ht 63.0 in | Wt 137.2 lb

## 2019-08-29 DIAGNOSIS — E538 Deficiency of other specified B group vitamins: Secondary | ICD-10-CM | POA: Diagnosis not present

## 2019-08-29 DIAGNOSIS — I10 Essential (primary) hypertension: Secondary | ICD-10-CM

## 2019-08-29 DIAGNOSIS — E785 Hyperlipidemia, unspecified: Secondary | ICD-10-CM

## 2019-08-29 DIAGNOSIS — R7303 Prediabetes: Secondary | ICD-10-CM | POA: Diagnosis not present

## 2019-08-29 MED ORDER — AMLODIPINE BESYLATE 10 MG PO TABS: ORAL_TABLET | ORAL | 3 refills | Status: DC

## 2019-08-29 NOTE — Patient Instructions (Signed)
Nice to see you. Please start back on your blood pressure medicine. We will check a B12 and contact you with the results. Please continue to work on exercising and monitoring your diet.

## 2019-08-29 NOTE — Assessment & Plan Note (Signed)
Check B12.  If in the normal range she can continue oral supplementation.  If low we will restart injections.

## 2019-08-29 NOTE — Assessment & Plan Note (Signed)
Discussed discontinuing checking her lipid panels.  She does not want to start on medication for this and she has never had a cardiovascular event and given her age we would be unlikely to start her on medication unless she had an event.

## 2019-08-29 NOTE — Progress Notes (Signed)
  [name], MD Phone: [phone]  Jane Willis is a 78 y.o. female who presents today for follow-up.  Hypertension: Was running 128-141/70-80 when she was taking her medication.  She ran out of her amlodipine and notes it has been running higher since then.  No chest pain or shortness of breath.  No edema.  B12 deficiency: She was getting injections though stopped and then she started taking B12 by mouth.  No tingling.  No numbness.  Prediabetes: No polyuria or polydipsia.  Diet is pretty healthy with lean meats and vegetables as well as healthy breakfast.  She stays active with raking leaves and walking.  Social History   Tobacco Use  Smoking Status Never Smoker  Smokeless Tobacco Never Used     ROS see history of present illness  Objective  Physical Exam Vitals:   08/29/19 1535  BP: (!) 160/70  Pulse: 63  Temp: (!) 97.4 F (36.3 C)  SpO2: 97%    BP Readings from Last 3 Encounters:  08/29/19 (!) 160/70  07/06/18 140/80  02/14/18 132/72   Wt Readings from Last 3 Encounters:  08/29/19 137 lb 3.2 oz (62.2 kg)  07/06/18 138 lb 9.6 oz (62.9 kg)  02/14/18 138 lb 1.9 oz (62.7 kg)    Physical Exam Constitutional:      General: She is not in acute distress.    Appearance: She is not diaphoretic.  Cardiovascular:     Rate and Rhythm: Normal rate and regular rhythm.     Heart sounds: Normal heart sounds.  Pulmonary:     Effort: Pulmonary effort is normal.     Breath sounds: Normal breath sounds.  Musculoskeletal:     Right lower leg: No edema.     Left lower leg: No edema.  Skin:    General: Skin is warm and dry.  Neurological:     Mental Status: She is alert.      Assessment/Plan: Please see individual problem list.  Hypertension Above goal though she has been off her medication.  She will restart her amlodipine.  If her blood pressure does not trend down she will let us know.  B12 deficiency Check B12.  If in the normal range she can  continue oral supplementation.  If low we will restart injections.  Hyperlipidemia Discussed discontinuing checking her lipid panels.  She does not want to start on medication for this and she has never had a cardiovascular event and given her age we would be unlikely to start her on medication unless she had an event.  Prediabetes Continue diet and exercise.  Plan to recheck in 6 months.   Orders Placed This Encounter  Procedures  . B12    Meds ordered this encounter  Medications  . amLODipine (NORVASC) 10 MG tablet    Sig: TAKE 1 TABLET(10 MG) BY MOUTH DAILY    Dispense:  90 tablet    Refill:  3     [name], MD [place]

## 2019-08-29 NOTE — Assessment & Plan Note (Signed)
Above goal though she has been off her medication.  She will restart her amlodipine.  If her blood pressure does not trend down she will let us know.

## 2019-08-29 NOTE — Assessment & Plan Note (Signed)
Continue diet and exercise.  Plan to recheck in 6 months.

## 2019-08-30 LAB — VITAMIN B12: Vitamin B-12: >1500 pg/mL — ABNORMAL HIGH (ref 211–911)

## 2019-09-06 ENCOUNTER — Ambulatory Visit: Payer: Medicare Other | Admitting: Family Medicine

## 2019-11-30 ENCOUNTER — Ambulatory Visit: Payer: Medicare Other | Attending: Internal Medicine

## 2019-11-30 DIAGNOSIS — Z20822 Contact with and (suspected) exposure to covid-19: Secondary | ICD-10-CM

## 2019-12-01 LAB — NOVEL CORONAVIRUS, NAA: SARS-CoV-2, NAA: DETECTED — AB

## 2019-12-02 ENCOUNTER — Telehealth: Payer: Self-pay | Admitting: Unknown Physician Specialty

## 2019-12-02 NOTE — Telephone Encounter (Signed)
Called to discuss with patient about Covid symptoms and the use of bamlanivimab, a monoclonal antibody infusion for those with mild to moderate Covid symptoms and at a high risk of hospitalization.  Pt is qualified for this infusion at the [facility] infusion center due to Age > 65   Message left to call back  

## 2019-12-02 NOTE — Telephone Encounter (Signed)
Called to discuss with Jane Willis about Covid symptoms and the use of bamlanivimab, a monoclonal antibody infusion for those with mild to moderate Covid symptoms and at a high risk of hospitalization. Had her second vaccine Tuesday.  Had symptoms of fatigue and tested positive on Thursday.  Today she has "cold" symptoms and sneezing and runny nose.      Pt is qualified for this infusion at the [facility] infusion center due to co-morbid conditions and/or a member of an at-risk group, however declines infusion at this time. Symptoms tier reviewed as well as criteria for ending isolation.  Symptoms reviewed that would warrant ED/Hospital evaluation. Preventative practices reviewed. Patient verbalized understanding. Patient advised to call back if he decides that he does want to get infusion. Callback number to the infusion center given. Patient advised to go to Urgent care or ED with severe symptoms. Last date  eligible for infusion is Friday Feb 19  Patient Active Problem List   Diagnosis Date Noted  . Prediabetes 07/06/2018  . Age-related osteoporosis without current pathological fracture 12/29/2017  . B12 deficiency 12/29/2017  . Allergic rhinitis 03/19/2017  . Family history of colon cancer 02/11/2017  . Hypertension 12/31/2016  . Hyperlipidemia 07/03/2016  . History of low impact fracture of ankle 07/03/2016  . Bilateral finger numbness 04/02/2016

## 2020-03-01 ENCOUNTER — Other Ambulatory Visit: Payer: Self-pay

## 2020-03-01 ENCOUNTER — Ambulatory Visit (INDEPENDENT_AMBULATORY_CARE_PROVIDER_SITE_OTHER): Payer: Medicare Other | Admitting: Family Medicine

## 2020-03-01 ENCOUNTER — Ambulatory Visit: Payer: Medicare Other

## 2020-03-01 ENCOUNTER — Ambulatory Visit (INDEPENDENT_AMBULATORY_CARE_PROVIDER_SITE_OTHER): Payer: Medicare Other

## 2020-03-01 VITALS — Ht 63.0 in | Wt 135.0 lb

## 2020-03-01 DIAGNOSIS — I10 Essential (primary) hypertension: Secondary | ICD-10-CM | POA: Diagnosis not present

## 2020-03-01 DIAGNOSIS — Z8616 Personal history of COVID-19: Secondary | ICD-10-CM | POA: Insufficient documentation

## 2020-03-01 DIAGNOSIS — E785 Hyperlipidemia, unspecified: Secondary | ICD-10-CM | POA: Diagnosis not present

## 2020-03-01 DIAGNOSIS — E538 Deficiency of other specified B group vitamins: Secondary | ICD-10-CM

## 2020-03-01 DIAGNOSIS — Z0000 Encounter for general adult medical examination without abnormal findings: Secondary | ICD-10-CM | POA: Diagnosis not present

## 2020-03-01 NOTE — Assessment & Plan Note (Signed)
Continue diet and exercise.  Deferring statin therapy given age and lack of cardiovascular history.

## 2020-03-01 NOTE — Assessment & Plan Note (Signed)
Continue over-the-counter B12 supplementation.  Plan for labs in 6 months.

## 2020-03-01 NOTE — Progress Notes (Signed)
Subjective:   Jane Willis is a 79 y.o. female who presents for Medicare Annual (Subsequent) preventive examination.  Review of Systems:  No ROS.  Medicare Wellness Virtual Visit.  Visual/audio telehealth visit, UTA vital signs.   Ht/Wt provided.  See social history for additional risk factors.   Cardiac Risk Factors include: advanced age (>34men, >42 women)     Objective:     Vitals: Ht 5\' 3"  (1.6 m)   Wt 135 lb (61.2 kg)   BMI 23.91 kg/m   Body mass index is 23.91 kg/m.  Advanced Directives 03/01/2020 03/01/2019 02/14/2018 02/11/2017  Does Patient Have a Medical Advance Directive? Yes Yes Yes Yes  Type of Paramedic of [place];Living will Living will;Healthcare Power of [place];Living will [place];Living will  Does patient want to make changes to medical advance directive? No - Patient declined No - Patient declined No - Patient declined No - Patient declined  Copy of [place] in Chart? No - copy requested No - copy requested No - copy requested No - copy requested    Tobacco Social History   Tobacco Use  Smoking Status Never Smoker  Smokeless Tobacco Never Used     Counseling given: Not Answered   Clinical Intake:  Pre-visit preparation completed: Yes        Diabetes: No  How often do you need to have someone help you when you read instructions, pamphlets, or other written materials from your doctor or pharmacy?: 1 - Never  Interpreter Needed?: No     Past Medical History:  Diagnosis Date  . Allergy    Seasonal  . Arthritis   . Chicken pox   . Hyperlipidemia   . Hypertension   . Urinary incontinence    Past Surgical History:  Procedure Laterality Date  . ABDOMINAL HYSTERECTOMY  1986  . BREAST EXCISIONAL BIOPSY Left 1970's   benign  . BREAST SURGERY  1968  . CATARACT EXTRACTION  2014/2015   Both eyes  . FRACTURE SURGERY  01/2009   Fractured  Left ankle, plate on outside of ankle 2 rods through/across ankle from inside of ankle  . OOPHORECTOMY    . TONSILLECTOMY AND ADENOIDECTOMY     Family History  Problem Relation Age of Onset  . Arthritis Mother   . Hypertension Mother   . Arthritis Father   . Hypertension Father   . Arthritis Maternal Grandmother   . Cancer Brother        Colon  . Cancer Maternal Aunt        Breast Cancer  . Breast cancer Maternal Aunt   . Breast cancer Cousin   . Breast cancer Cousin   . Hyperlipidemia Son   . Hyperlipidemia Son    Social History   Socioeconomic History  . Marital status: Widowed    Spouse name: Not on file  . Number of children: Not on file  . Years of education: Not on file  . Highest education level: Not on file  Occupational History  . Not on file  Tobacco Use  . Smoking status: Never Smoker  . Smokeless tobacco: Never Used  Substance and Sexual Activity  . Alcohol use: No    Alcohol/week: 0.0 standard drinks  . Drug use: No  . Sexual activity: Never    Partners: Male  Other Topics Concern  . Not on file  Social History Narrative   Widowed   Retired  Children 3   Pets: none   Caffeine- Coffee, Tea, rare soda   Social Determinants of Health   Financial Resource Strain:   . Difficulty of Paying Living Expenses:   Food Insecurity:   . Worried About Charity fundraiser in the Last Year:   . Arboriculturist in the Last Year:   Transportation Needs:   . Film/video editor (Medical):   Marland Kitchen Lack of Transportation (Non-Medical):   Physical Activity:   . Days of Exercise per Week:   . Minutes of Exercise per Session:   Stress:   . Feeling of Stress :   Social Connections:   . Frequency of Communication with Friends and Family:   . Frequency of Social Gatherings with Friends and Family:   . Attends Religious Services:   . Active Member of Clubs or Organizations:   . Attends Archivist Meetings:   Marland Kitchen Marital Status:     Outpatient Encounter  Medications as of 03/01/2020  Medication Sig  . amLODipine (NORVASC) 10 MG tablet TAKE 1 TABLET(10 MG) BY MOUTH DAILY  . aspirin 81 MG tablet Take 81 mg by mouth daily. Reported on 02/21/2016  . Cholecalciferol (VITAMIN D-3) 1000 UNITS CAPS Take 1,000 capsules by mouth 2 (two) times daily.  . Omega-3 Fatty Acids (FISH OIL) 1360 MG CAPS Take by mouth.  . vitamin B-12 (CYANOCOBALAMIN) 1000 MCG tablet   . vitamin C (ASCORBIC ACID) 500 MG tablet Take 500 mg by mouth daily.   No facility-administered encounter medications on file as of 03/01/2020.    Activities of Daily Living In your present state of health, do you have any difficulty performing the following activities: 03/01/2020  Hearing? N  Vision? N  Difficulty concentrating or making decisions? N  Walking or climbing stairs? N  Dressing or bathing? N  Doing errands, shopping? N  Preparing Food and eating ? N  Using the Toilet? N  In the past six months, have you accidently leaked urine? Y  Comment Pessary in place. Managed with daily pad.  Do you have problems with loss of bowel control? N  Managing your Medications? N  Managing your Finances? N  Housekeeping or managing your Housekeeping? N  Some recent data might be hidden    Patient Care Team: Leone Haven, MD as PCP - General (Family Medicine)    Assessment:   This is a routine wellness examination for [place].  Nurse connected with patient 03/01/20 at  2:00 PM EDT by a telephone enabled telemedicine application, patient does not have access to video or difficulty with video and verified that I am speaking with the correct person using two identifiers. Patient stated full name and DOB. Patient gave permission to continue with telephone visit. Patient's location was at home and Nurse's location was at [place] office.   Patient is alert and oriented x3. Patient denies difficulty focusing or concentrating. 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   Health Maintenance Due: See completed HM at the end of note.   Eye: Visual acuity not assessed. Virtual visit. Followed by their ophthalmologist.  Dental: Visits every 9-12 months.    Hearing: Demonstrates normal hearing during visit.  Safety:  Patient feels safe at home- yes Patient does have smoke detectors at home- yes Patient does wear sunscreen or protective clothing when in direct sunlight - yes Patient does wear seat  belt when in a moving vehicle - yes Patient drives- yes Adequate lighting in walkways free from debris- yes Grab bars and handrails used as appropriate- yes Ambulates with an assistive device- no  Social: Alcohol intake - no     Smoking history- never   Smokers in home? none Illicit drug use? none  Medication: Taking as directed and without issues.  Self managed - yes   Covid-19: Precautions and sickness symptoms discussed. Wears mask, social distancing, hand hygiene as appropriate.   Activities of Daily Living Patient denies needing assistance with: household chores, feeding themselves, getting from bed to chair, getting to the toilet, bathing/showering, dressing, managing money, or preparing meals.   Discussed the importance of a healthy diet, water intake and the benefits of aerobic exercise.   Physical activity- walking  Diet:  Regular Water: good intake Caffeine: minimal   Other Providers Patient Care Team: Leone Haven, MD as PCP - General (Family Medicine)  Exercise Activities and Dietary recommendations Current Exercise Habits: Home exercise routine, Intensity: Mild  Goals    . DIET - INCREASE WATER INTAKE     Low cholesterol diet    . Increase physical activity     Walk for exercise       Fall Risk Fall Risk  03/01/2020 03/01/2020 08/29/2019 03/01/2019 02/14/2018  Falls in the past year? 0 0 0 0 No  Number falls in past yr: - - 0 - -    Follow up Falls evaluation completed Falls evaluation completed Falls evaluation completed - -   Timed Get Up and Go performed: no, virtual visit  Depression Screen PHQ 2/9 Scores 03/01/2020 08/29/2019 03/01/2019 02/14/2018  PHQ - 2 Score 0 0 0 0  PHQ- 9 Score - - - -     Cognitive Function MMSE - Mini Mental State Exam 02/14/2018 02/11/2017  Orientation to time 5 5  Orientation to Place 5 5  Registration 3 3  Attention/ Calculation 5 5  Recall 3 3  Language- name 2 objects 2 2  Language- repeat 1 1  Language- follow 3 step command 3 3  Language- read & follow direction 1 1  Write a sentence 1 1  Copy design 1 1  Total score 30 30     6CIT Screen 03/01/2020 03/01/2019  What Year? 0 points 0 points  What month? 0 points 0 points  What time? - 0 points  Count back from 20 - 0 points  Months in reverse 0 points 0 points  Repeat phrase - 0 points  Total Score - 0    Immunization History  Administered Date(s) Administered  . Influenza, High Dose Seasonal PF 08/01/2016, 07/16/2017, 07/06/2018, 08/01/2019  . PFIZER SARS-COV-2 Vaccination 11/06/2019, 11/28/2019  . Pneumococcal-Unspecified 01/25/2012  . Tdap 01/21/2015   Screening Tests Health Maintenance  Topic Date Due  . INFLUENZA VACCINE  05/19/2020  . TETANUS/TDAP  01/20/2025  . DEXA SCAN  Completed  . COVID-19 Vaccine  Completed  . PNA vac Low Risk Adult  Completed      Plan:   Keep all routine maintenance appointments.   Follow up in 2 weeks as directed by PMD.   Medicare Attestation I have personally reviewed: The patient's medical and social history Their use of alcohol, tobacco or illicit drugs Their current medications and supplements The patient's functional ability including ADLs,fall risks, home safety risks, cognitive, and hearing and visual impairment Diet and physical activities Evidence for depression   I have reviewed and discussed with patient  certain preventive protocols, quality metrics, and  best practice recommendations.      Varney Biles, LPN  [id number]

## 2020-03-01 NOTE — Progress Notes (Addendum)
  [name], MD Phone: [phone]  Jane Willis is a 79 y.o. female who presents today for f/u.  HYPERTENSION  Disease Monitoring  Home BP Monitoring not checking, though reports her BP was borderline low when she had her Reclast infusion recently chest pain- no    Dyspnea- no Medications  Compliance-  Taking amlodipine.  Edema- no  Hyperlipidemia: Not on medication.  No claudication.  Diet consists of regular food.  Lots of chicken and vegetables.  Something about her crackers.  Toast eggs.  No soda or sweet tea.  Stays active.  Does walk some during the week.  B12 deficiency: Currently taking 1000 mcg of B12 over-the-counter.  History of Covid: Previously had a mild case.  She has had no long-term effects from this.    Social History   Tobacco Use  Smoking Status Never Smoker  Smokeless Tobacco Never Used     ROS see history of present illness  Objective  Physical Exam Vitals:   03/01/20 0940 03/01/20 0952  BP: (!) 158/88 (!) 160/88  Pulse: 74   Temp: (!) 97.4 F (36.3 C)   SpO2: 99%     BP Readings from Last 3 Encounters:  03/01/20 (!) 160/88  08/29/19 (!) 160/70  07/06/18 140/80   Wt Readings from Last 3 Encounters:  03/01/20 135 lb 12.8 oz (61.6 kg)  08/29/19 137 lb 3.2 oz (62.2 kg)  07/06/18 138 lb 9.6 oz (62.9 kg)    Physical Exam Constitutional:      General: She is not in acute distress.    Appearance: She is not diaphoretic.  Cardiovascular:     Rate and Rhythm: Normal rate and regular rhythm.     Heart sounds: Normal heart sounds.  Pulmonary:     Effort: Pulmonary effort is normal.     Breath sounds: Normal breath sounds.  Musculoskeletal:     Right lower leg: No edema.     Left lower leg: No edema.  Skin:    General: Skin is warm and dry.  Neurological:     Mental Status: She is alert.      Assessment/Plan: Please see individual problem list.  Hypertension Patient will start checking her blood pressure at home.  She  will return in 2 weeks for recheck with me.  Consider adding medicine if elevated at home.  Continue amlodipine.  Hyperlipidemia Continue diet and exercise.  Deferring statin therapy given age and lack of cardiovascular history.  B12 deficiency Continue over-the-counter B12 supplementation.  Plan for labs in 6 months.  History of COVID-19 Doing well.  No long-term effects.  She has completed her Covid vaccine series.   No orders of the defined types were placed in this encounter.   No orders of the defined types were placed in this encounter.   This visit occurred during the SARS-CoV-2 public health emergency.  Safety protocols were in place, including screening questions prior to the visit, additional usage of staff PPE, and extensive cleaning of exam room while observing appropriate contact time as indicated for disinfecting solutions.    [name], MD [place]

## 2020-03-01 NOTE — Patient Instructions (Signed)
Nice to see you. Please start checking your blood pressure daily at home.  We will have you return in 2 weeks.

## 2020-03-01 NOTE — Patient Instructions (Addendum)
  Jane Willis , Thank you for taking time to come for your Medicare Wellness Visit. I appreciate your ongoing commitment to your health goals. Please review the following plan we discussed and let me know if I can assist you in the future.   These are the goals we discussed: Goals    . DIET - INCREASE WATER INTAKE     Low cholesterol diet    . Increase physical activity     Walk for exercise       This is a list of the screening recommended for you and due dates:  Health Maintenance  Topic Date Due  . Flu Shot  05/19/2020  . Tetanus Vaccine  01/20/2025  . DEXA scan (bone density measurement)  Completed  . COVID-19 Vaccine  Completed  . Pneumonia vaccines  Completed

## 2020-03-01 NOTE — Assessment & Plan Note (Signed)
Patient will start checking her blood pressure at home.  She will return in 2 weeks for recheck with me.  Consider adding medicine if elevated at home.  Continue amlodipine.

## 2020-03-01 NOTE — Progress Notes (Signed)
I have reviewed the above note and agree.  Cordarrel Stiefel, M.D.  

## 2020-03-01 NOTE — Progress Notes (Signed)
Pre visit review using our clinic review tool, if applicable. No additional management support is needed unless otherwise documented below in the visit note. 

## 2020-03-01 NOTE — Assessment & Plan Note (Signed)
Doing well.  No long-term effects.  She has completed her Covid vaccine series.

## 2020-03-15 ENCOUNTER — Encounter: Payer: Self-pay | Admitting: Family Medicine

## 2020-03-15 ENCOUNTER — Other Ambulatory Visit: Payer: Self-pay

## 2020-03-15 ENCOUNTER — Ambulatory Visit (INDEPENDENT_AMBULATORY_CARE_PROVIDER_SITE_OTHER): Payer: Medicare Other | Admitting: Family Medicine

## 2020-03-15 DIAGNOSIS — I10 Essential (primary) hypertension: Secondary | ICD-10-CM | POA: Diagnosis not present

## 2020-03-15 DIAGNOSIS — R7303 Prediabetes: Secondary | ICD-10-CM | POA: Diagnosis not present

## 2020-03-15 LAB — BASIC METABOLIC PANEL
BUN: 21 mg/dL (ref 6–23)
CO2: 27 mEq/L (ref 19–32)
Calcium: 9.2 mg/dL (ref 8.4–10.5)
Chloride: 102 mEq/L (ref 96–112)
Creatinine, Ser: 0.98 mg/dL (ref 0.40–1.20)
GFR: 54.5 mL/min — ABNORMAL LOW (ref >60.00)
Glucose, Bld: 91 mg/dL (ref 70–99)
Potassium: 4.1 mEq/L (ref 3.5–5.1)
Sodium: 136 mEq/L (ref 135–145)

## 2020-03-15 LAB — HEMOGLOBIN A1C: Hgb A1c MFr Bld: 5.8 % (ref 4.6–6.5)

## 2020-03-15 NOTE — Assessment & Plan Note (Addendum)
Above goal consistently at home.  We will check a BMP and then add on losartan.  She will return in about 3 weeks for repeat labs as she is going out of town and does not want to start the medicine until she gets back.  She will continue her amlodipine.

## 2020-03-15 NOTE — Patient Instructions (Signed)
Nice to see you. We will get labs today and then add on losartan.  You will return in about 10 days for repeat labs. Please continue your amlodipine.

## 2020-03-15 NOTE — Assessment & Plan Note (Signed)
Continue diet and exercise.  Check A1c. 

## 2020-03-15 NOTE — Progress Notes (Signed)
  [name], MD Phone: [phone]  Jane Willis is a 79 y.o. female who presents today for f/u.  HYPERTENSION  Disease Monitoring  Home BP Monitoring 125-167/76-94 Chest pain- no    Dyspnea- no Medications  Compliance-  Taking amlodipine  Edema- chronic in left ankle related to prior surgery  Prediabetes: No polyuria or polydipsia.  Diet is healthy.  No soda or sweet tea.  Avoid sugar.  Avoid salt.  No specific exercise though does stay active.    Social History   Tobacco Use  Smoking Status Never Smoker  Smokeless Tobacco Never Used     ROS see history of present illness  Objective  Physical Exam Vitals:   03/15/20 0942  BP: 120/70  Pulse: 64  Temp: 97.6 F (36.4 C)  SpO2: 96%    BP Readings from Last 3 Encounters:  03/15/20 120/70  03/01/20 (!) 160/88  08/29/19 (!) 160/70   Wt Readings from Last 3 Encounters:  03/15/20 133 lb 6.4 oz (60.5 kg)  03/01/20 135 lb (61.2 kg)  03/01/20 135 lb 12.8 oz (61.6 kg)    Physical Exam Constitutional:      General: She is not in acute distress.    Appearance: She is not diaphoretic.  Cardiovascular:     Rate and Rhythm: Normal rate and regular rhythm.     Heart sounds: Normal heart sounds.  Pulmonary:     Effort: Pulmonary effort is normal.     Breath sounds: Normal breath sounds.  Skin:    General: Skin is warm and dry.  Neurological:     Mental Status: She is alert.      Assessment/Plan: Please see individual problem list.  Hypertension Above goal consistently at home.  We will check a BMP and then add on losartan.  She will return in about 3 weeks for repeat labs as she is going out of town and does not want to start the medicine until she gets back.  She will continue her amlodipine.  Prediabetes Continue diet and exercise.  Check A1c.   Orders Placed This Encounter  Procedures  . Basic Metabolic Panel (BMET)  . HgB A1c  . Basic Metabolic Panel (BMET)    Standing Status:   Future   Standing Expiration Date:   03/15/2021    No orders of the defined types were placed in this encounter.   This visit occurred during the SARS-CoV-2 public health emergency.  Safety protocols were in place, including screening questions prior to the visit, additional usage of staff PPE, and extensive cleaning of exam room while observing appropriate contact time as indicated for disinfecting solutions.    [name], MD [place]

## 2020-03-21 ENCOUNTER — Other Ambulatory Visit: Payer: Self-pay | Admitting: Family Medicine

## 2020-03-21 DIAGNOSIS — N179 Acute kidney failure, unspecified: Secondary | ICD-10-CM

## 2020-03-28 ENCOUNTER — Other Ambulatory Visit: Payer: Medicare Other

## 2020-04-05 ENCOUNTER — Other Ambulatory Visit (INDEPENDENT_AMBULATORY_CARE_PROVIDER_SITE_OTHER): Payer: Medicare Other

## 2020-04-05 ENCOUNTER — Other Ambulatory Visit: Payer: Self-pay

## 2020-04-05 DIAGNOSIS — N179 Acute kidney failure, unspecified: Secondary | ICD-10-CM | POA: Diagnosis not present

## 2020-04-05 LAB — BASIC METABOLIC PANEL
BUN: 19 mg/dL (ref 6–23)
CO2: 26 mEq/L (ref 19–32)
Calcium: 9 mg/dL (ref 8.4–10.5)
Chloride: 105 mEq/L (ref 96–112)
Creatinine, Ser: 0.93 mg/dL (ref 0.40–1.20)
GFR: 57.89 mL/min — ABNORMAL LOW (ref >60.00)
Glucose, Bld: 92 mg/dL (ref 70–99)
Potassium: 4 mEq/L (ref 3.5–5.1)
Sodium: 136 mEq/L (ref 135–145)

## 2020-04-08 ENCOUNTER — Telehealth: Payer: Self-pay

## 2020-04-08 ENCOUNTER — Other Ambulatory Visit: Payer: Self-pay | Admitting: Family Medicine

## 2020-04-08 DIAGNOSIS — N179 Acute kidney failure, unspecified: Secondary | ICD-10-CM

## 2020-04-08 NOTE — Telephone Encounter (Signed)
-----   Message from Leone Haven, MD sent at 04/05/2020  4:21 PM EDT ----- Please let the patient know that her kidney function has improved slightly though does still remain below her baseline.  We can recheck in another month to determine if it returns to normal.  Thanks.

## 2020-04-09 ENCOUNTER — Ambulatory Visit (INDEPENDENT_AMBULATORY_CARE_PROVIDER_SITE_OTHER): Payer: Medicare Other

## 2020-04-09 ENCOUNTER — Other Ambulatory Visit: Payer: Self-pay

## 2020-04-09 VITALS — BP 130/71 | HR 64

## 2020-04-09 DIAGNOSIS — I10 Essential (primary) hypertension: Secondary | ICD-10-CM | POA: Diagnosis not present

## 2020-04-09 NOTE — Progress Notes (Signed)
Patient is here for a BP check due to bp being high at last visit, as per patient.  Currently patients BP is 130/71 and BPM is 64.  Ten minutes prior BP was 158/72 and BPM 66.  Patient has no complaints of headaches, blurry vision, chest pain, arm pain, light headedness, dizziness, and nor jaw pain. Please see previous note for order.

## 2020-04-10 NOTE — Progress Notes (Signed)
Spoken to patient, she stated that when she checks it seems to be all over the place. Yesterday he BP was 124/72 then later on it became 158/72. Patient stated she will keep a record.

## 2020-04-10 NOTE — Progress Notes (Signed)
Left message for patient to return call back.  

## 2020-04-11 NOTE — Progress Notes (Signed)
Noted. I would like to start her on losartan to help lower her BP. I can order this once you speak with her. She would need labs 7-10 days after starting the losartan.

## 2020-04-11 NOTE — Progress Notes (Signed)
Left message for patient to return call back.  

## 2020-04-12 MED ORDER — LOSARTAN POTASSIUM 50 MG PO TABS: 50.0000 mg | ORAL_TABLET | Freq: Every day | ORAL | 3 refills | Status: DC

## 2020-04-12 NOTE — Progress Notes (Signed)
Patient is ok with medication

## 2020-04-12 NOTE — Progress Notes (Signed)
Patient has scheduled lab appointment already.

## 2020-04-12 NOTE — Progress Notes (Signed)
Patient needs labs scheduled 7-10 days after starting the losartan. Orders placed. Losartan sent to pharmacy.

## 2020-04-12 NOTE — Addendum Note (Signed)
Addended by: Caryl Bis, Kyla Duffy G on: 04/12/2020 12:20 PM   Modules accepted: Orders

## 2020-04-23 ENCOUNTER — Other Ambulatory Visit: Payer: Self-pay

## 2020-04-23 ENCOUNTER — Other Ambulatory Visit (INDEPENDENT_AMBULATORY_CARE_PROVIDER_SITE_OTHER): Payer: Medicare Other

## 2020-04-23 DIAGNOSIS — I10 Essential (primary) hypertension: Secondary | ICD-10-CM | POA: Diagnosis not present

## 2020-04-23 LAB — BASIC METABOLIC PANEL
BUN: 16 mg/dL (ref 6–23)
CO2: 27 mEq/L (ref 19–32)
Calcium: 9.2 mg/dL (ref 8.4–10.5)
Chloride: 102 mEq/L (ref 96–112)
Creatinine, Ser: 0.95 mg/dL (ref 0.40–1.20)
GFR: 56.48 mL/min — ABNORMAL LOW (ref >60.00)
Glucose, Bld: 147 mg/dL — ABNORMAL HIGH (ref 70–99)
Potassium: 3.8 mEq/L (ref 3.5–5.1)
Sodium: 139 mEq/L (ref 135–145)

## 2020-05-08 ENCOUNTER — Other Ambulatory Visit: Payer: Medicare Other

## 2020-06-18 ENCOUNTER — Encounter: Payer: Self-pay | Admitting: Family Medicine

## 2020-06-18 ENCOUNTER — Other Ambulatory Visit: Payer: Self-pay

## 2020-06-18 ENCOUNTER — Ambulatory Visit (INDEPENDENT_AMBULATORY_CARE_PROVIDER_SITE_OTHER): Payer: Medicare Other | Admitting: Family Medicine

## 2020-06-18 DIAGNOSIS — M810 Age-related osteoporosis without current pathological fracture: Secondary | ICD-10-CM

## 2020-06-18 DIAGNOSIS — I10 Essential (primary) hypertension: Secondary | ICD-10-CM | POA: Diagnosis not present

## 2020-06-18 NOTE — Assessment & Plan Note (Addendum)
Adequate control for age at home.  She will continue to monitor.  If it trends up at home she will let us know.  Continue amlodipine.

## 2020-06-18 NOTE — Progress Notes (Signed)
  [name], MD Phone: [phone]  Jane Willis is a 79 y.o. female who presents today for f/u.  HYPERTENSION  Disease Monitoring  Home BP Monitoring mostly <140/90  Chest pain- no    Dyspnea- no Medications  Compliance-  Taking amlodipine.   Edema- no  Osteoporosis: Continues on Reclast through endocrinology.  Taking calcium and vitamin D.  No fractures.  Due for bone density scan in November.    Social History   Tobacco Use  Smoking Status Never Smoker  Smokeless Tobacco Never Used     ROS see history of present illness  Objective  Physical Exam Vitals:   06/18/20 0908  BP: (!) 150/80  Pulse: 61  Temp: 98.3 F (36.8 C)  SpO2: 96%    BP Readings from Last 3 Encounters:  06/18/20 (!) 150/80  04/09/20 130/71  03/15/20 120/70   Wt Readings from Last 3 Encounters:  06/18/20 134 lb 12.8 oz (61.1 kg)  03/15/20 133 lb 6.4 oz (60.5 kg)  03/01/20 135 lb (61.2 kg)    Physical Exam Constitutional:      General: She is not in acute distress.    Appearance: She is not diaphoretic.  Cardiovascular:     Rate and Rhythm: Normal rate and regular rhythm.     Heart sounds: Normal heart sounds.  Pulmonary:     Effort: Pulmonary effort is normal.     Breath sounds: Normal breath sounds.  Musculoskeletal:     Left lower leg: No edema.  Skin:    General: Skin is warm and dry.  Neurological:     Mental Status: She is alert.      Assessment/Plan: Please see individual problem list.  Hypertension Adequate control for age at home.  She will continue to monitor.  If it trends up at home she will let us know.  Continue amlodipine.  Age-related osteoporosis without current pathological fracture She will continue Reclast through endocrinology.  We will order a DEXA scan for her to call and schedule for completion in November.  Continue calcium and vitamin D supplementation.   No orders of the defined types were placed in this encounter.   No orders of  the defined types were placed in this encounter.   This visit occurred during the SARS-CoV-2 public health emergency.  Safety protocols were in place, including screening questions prior to the visit, additional usage of staff PPE, and extensive cleaning of exam room while observing appropriate contact time as indicated for disinfecting solutions.    [name], MD [place]

## 2020-06-18 NOTE — Assessment & Plan Note (Signed)
She will continue Reclast through endocrinology.  We will order a DEXA scan for her to call and schedule for completion in November.  Continue calcium and vitamin D supplementation.

## 2020-06-18 NOTE — Patient Instructions (Signed)
Nice to see you. Please call to schedule your bone density scan for a date after 09/07/2020. Please monitor your blood pressure and if it is consistently running above 140/90 please let us know.

## 2020-06-19 ENCOUNTER — Telehealth: Payer: Self-pay | Admitting: Family Medicine

## 2020-06-19 NOTE — Telephone Encounter (Signed)
Pt wants to talk about blood pressure prescription and the effects of the medication. She would like a call back.

## 2020-06-20 ENCOUNTER — Other Ambulatory Visit: Payer: Self-pay

## 2020-06-20 DIAGNOSIS — I10 Essential (primary) hypertension: Secondary | ICD-10-CM

## 2020-06-20 MED ORDER — AMLODIPINE BESYLATE 10 MG PO TABS: ORAL_TABLET | ORAL | 3 refills | Status: DC

## 2020-06-20 NOTE — Telephone Encounter (Signed)
I called and informed the patient not to take the Losartan just the Amlodipine and that her new RX for Amlodipine was sent to pharmacy and she understood.  Turner Baillie,cma

## 2020-06-20 NOTE — Telephone Encounter (Signed)
Correction on the Prior telephone note.  The patient does not want to take the Losartan anymore, she has not been taking it because of lightheadedness when taking, she is only wanting to take the amlodipine[ine. I informed her that she was on the amlodipine in the past and the losartan was added because her BP was not at goal and she understood but stated she did not want to take it because of how woozy she felt taking it, I informed her to continue the amlodipine and starting tomorrow I wanted her to record her BP and call it in to me on Tuesday to see how it has been running on the amlodipine to give to the provider for his review.  She agreed.  Kenishia Plack,cma

## 2020-06-20 NOTE — Telephone Encounter (Signed)
She should currently just be on the amlodipine.  She should not take losartan anymore.  We discussed that at her visit a couple of days ago.

## 2020-06-20 NOTE — Telephone Encounter (Signed)
I called and spoke to the patient and she had a question, She is on amlodipine 10 mg and she wanted to know if she should be taking Losartan as well, she only wants to take the losartan and I informed her that on her current med list it is only showing the amlodipine so I informed  her that the amlodipine is all I think she is takiing, if this is not correct please let me know.  She also needed  a refill on the amlodipine, and I sent it to the pharmacy.  Kemiah Booz,cma

## 2020-08-22 ENCOUNTER — Telehealth: Payer: Self-pay | Admitting: Family Medicine

## 2020-08-22 NOTE — Telephone Encounter (Signed)
Is it possible to remove losartan (COZAAR) 50 MG tablet. There is a issue with [facility] want to keep filling this medication. Per not on medication patient is not taking.

## 2020-12-16 ENCOUNTER — Ambulatory Visit: Payer: Medicare Other | Admitting: Family Medicine

## 2020-12-20 ENCOUNTER — Other Ambulatory Visit: Payer: Self-pay

## 2020-12-24 ENCOUNTER — Ambulatory Visit (INDEPENDENT_AMBULATORY_CARE_PROVIDER_SITE_OTHER): Payer: Medicare Other | Admitting: Family Medicine

## 2020-12-24 ENCOUNTER — Other Ambulatory Visit: Payer: Self-pay

## 2020-12-24 ENCOUNTER — Encounter: Payer: Self-pay | Admitting: Family Medicine

## 2020-12-24 DIAGNOSIS — R7303 Prediabetes: Secondary | ICD-10-CM

## 2020-12-24 DIAGNOSIS — R229 Localized swelling, mass and lump, unspecified: Secondary | ICD-10-CM

## 2020-12-24 DIAGNOSIS — E785 Hyperlipidemia, unspecified: Secondary | ICD-10-CM | POA: Diagnosis not present

## 2020-12-24 DIAGNOSIS — I10 Essential (primary) hypertension: Secondary | ICD-10-CM | POA: Diagnosis not present

## 2020-12-24 LAB — LIPID PANEL
Cholesterol: 250 mg/dL — ABNORMAL HIGH (ref 0–200)
HDL: 63 mg/dL (ref >39.00)
LDL Cholesterol: 150 mg/dL — ABNORMAL HIGH (ref 0–99)
NonHDL: 186.51
Total CHOL/HDL Ratio: 4
Triglycerides: 185 mg/dL — ABNORMAL HIGH (ref 0.0–149.0)
VLDL: 37 mg/dL (ref 0.0–40.0)

## 2020-12-24 LAB — COMPREHENSIVE METABOLIC PANEL
ALT: 9 U/L (ref 0–35)
AST: 14 U/L (ref 0–37)
Albumin: 3.9 g/dL (ref 3.5–5.2)
Alkaline Phosphatase: 60 U/L (ref 39–117)
BUN: 20 mg/dL (ref 6–23)
CO2: 27 mEq/L (ref 19–32)
Calcium: 9.2 mg/dL (ref 8.4–10.5)
Chloride: 102 mEq/L (ref 96–112)
Creatinine, Ser: 0.83 mg/dL (ref 0.40–1.20)
GFR: 66.06 mL/min (ref >60.00)
Glucose, Bld: 89 mg/dL (ref 70–99)
Potassium: 3.9 mEq/L (ref 3.5–5.1)
Sodium: 137 mEq/L (ref 135–145)
Total Bilirubin: 0.4 mg/dL (ref 0.2–1.2)
Total Protein: 6.7 g/dL (ref 6.0–8.3)

## 2020-12-24 LAB — HEMOGLOBIN A1C: Hgb A1c MFr Bld: 5.7 % (ref 4.6–6.5)

## 2020-12-24 NOTE — Assessment & Plan Note (Signed)
Continue diet and exercise.  Check lipid panel.

## 2020-12-24 NOTE — Progress Notes (Signed)
Marikay Alar, MD Phone: [phone]  Jane Willis is a 80 y.o. female who presents today for f/u.  HYPERTENSION  Disease Monitoring  Home BP Monitoring 138/76, always <140/80 Chest pain- no    Dyspnea- no Medications  Compliance-  Taking amlodipine.   Edema- no  Hyperlipidemia: No claudication.  Walks for exercise.  Diet is healthy with chicken, fruit, vegetables, and dairy.  Also eats salads.  No soda or sweet tea.  Prediabetes: Due for A1c.  Skin nodule: Patient notes this area has not changed in size though does note occasional discomfort typically at night in the nodule.  This has been present since she got a Prolia injection.  There has been no injury to the area.  Social History   Tobacco Use  Smoking Status Never Smoker  Smokeless Tobacco Never Used    Current Outpatient Medications on File Prior to Visit  Medication Sig Dispense Refill  . amLODipine (NORVASC) 10 MG tablet TAKE 1 TABLET(10 MG) BY MOUTH DAILY 90 tablet 3  . aspirin 81 MG tablet Take 81 mg by mouth daily. Reported on 02/21/2016    . Cholecalciferol (VITAMIN D-3) 1000 UNITS CAPS Take 1,000 capsules by mouth 2 (two) times daily.    . Omega-3 Fatty Acids (FISH OIL) 1360 MG CAPS Take by mouth.    . vitamin B-12 (CYANOCOBALAMIN) 1000 MCG tablet     . vitamin C (ASCORBIC ACID) 500 MG tablet Take 500 mg by mouth daily.     No current facility-administered medications on file prior to visit.     ROS see history of present illness  Objective  Physical Exam Vitals:   12/24/20 0932  BP: 140/80  Pulse: 66  Temp: 98.2 F (36.8 C)  SpO2: 99%    BP Readings from Last 3 Encounters:  12/24/20 140/80  06/18/20 (!) 150/80  04/09/20 130/71   Wt Readings from Last 3 Encounters:  12/24/20 138 lb 12.8 oz (63 kg)  06/18/20 134 lb 12.8 oz (61.1 kg)  03/15/20 133 lb 6.4 oz (60.5 kg)    Physical Exam Constitutional:      General: She is not in acute distress.    Appearance: She is not diaphoretic.   Cardiovascular:     Rate and Rhythm: Normal rate and regular rhythm.     Heart sounds: Normal heart sounds.  Pulmonary:     Effort: Pulmonary effort is normal.     Breath sounds: Normal breath sounds.  Musculoskeletal:        General: No edema.       Arms:  Skin:    General: Skin is warm and dry.  Neurological:     Mental Status: She is alert.      Assessment/Plan: Please see individual problem list.  Problem List Items Addressed This Visit    Hyperlipidemia    Continue diet and exercise.  Check lipid panel.      Relevant Orders   Lipid panel   Comp Met (CMET)   Hypertension    Adequate control for age.  She will continue amlodipine 10 mg once daily.      Relevant Orders   Comp Met (CMET)   Prediabetes    Continue diet and exercise.  Check A1c.      Relevant Orders   HgB A1c   Subcutaneous nodule    Prior ultrasound with likely cyst or lipoma.  Some occasional discomfort with this possibly related to laying on this area.  Advised if this continues we could  have her see a dermatologist to consider removal.          Health Maintenance: Discussed with the patient get her Shingrix vaccine at the pharmacy.  She reports having had her Covid vaccines and booster as well as flu vaccine.  The patient also asked about the need for colonoscopy.  She denies any symptoms. Notes she is ok with no further colon cancer screening given her age.     This visit occurred during the SARS-CoV-2 public health emergency.  Safety protocols were in place, including screening questions prior to the visit, additional usage of staff PPE, and extensive cleaning of exam room while observing appropriate contact time as indicated for disinfecting solutions.    [name], MD [place]

## 2020-12-24 NOTE — Assessment & Plan Note (Signed)
Adequate control for age.  She will continue amlodipine 10 mg once daily.

## 2020-12-24 NOTE — Assessment & Plan Note (Signed)
Continue diet and exercise.  Check A1c.

## 2020-12-24 NOTE — Patient Instructions (Signed)
You can get the Shingrix vaccine at the pharmacy. Please continue with diet exercise. We will get labs today. If the nodule continues to bother you please let me know and we can refer you to a specialist to consider removal.

## 2020-12-24 NOTE — Assessment & Plan Note (Signed)
Prior ultrasound with likely cyst or lipoma.  Some occasional discomfort with this possibly related to laying on this area.  Advised if this continues we could have her see a dermatologist to consider removal.

## 2021-03-04 ENCOUNTER — Other Ambulatory Visit: Payer: Self-pay

## 2021-03-04 ENCOUNTER — Ambulatory Visit (INDEPENDENT_AMBULATORY_CARE_PROVIDER_SITE_OTHER): Payer: Medicare Other

## 2021-03-04 VITALS — BP 145/82 | HR 67 | Temp 97.1°F | Resp 14 | Ht 63.0 in | Wt 135.2 lb

## 2021-03-04 DIAGNOSIS — Z0000 Encounter for general adult medical examination without abnormal findings: Secondary | ICD-10-CM

## 2021-03-04 NOTE — Progress Notes (Signed)
Subjective:   Jane Willis is a 80 y.o. female who presents for Medicare Annual (Subsequent) preventive examination.  Review of Systems    No ROS.  Medicare Wellness Cardiac Risk Factors include: advanced age (>35men, >30 women)     Objective:    Today's Vitals   03/04/21 1407  BP: (!) 145/82  Pulse: 67  Resp: 14  Temp: (!) 97.1 F (36.2 C)  SpO2: 97%  Weight: 135 lb 3.2 oz (61.3 kg)  Height: 5\' 3"  (1.6 m)   Body mass index is 23.95 kg/m.  Advanced Directives 03/04/2021 03/01/2020 03/01/2019 02/14/2018 02/11/2017  Does Patient Have a Medical Advance Directive? Yes Yes Yes Yes Yes  Type of Paramedic of [place];Living will Living will;Healthcare Power of [place];Living will [place];Living will  Does patient want to make changes to medical advance directive? No - Patient declined No - Patient declined No - Patient declined No - Patient declined No - Patient declined  Copy of [place] in Chart? No - copy requested No - copy requested No - copy requested No - copy requested No - copy requested    Current Medications (verified) Outpatient Encounter Medications as of 03/04/2021  Medication Sig  . amLODipine (NORVASC) 10 MG tablet TAKE 1 TABLET(10 MG) BY MOUTH DAILY  . aspirin 81 MG tablet Take 81 mg by mouth daily. Reported on 02/21/2016  . Cholecalciferol (VITAMIN D-3) 1000 UNITS CAPS Take 1,000 capsules by mouth 2 (two) times daily.  . Omega-3 Fatty Acids (FISH OIL) 1360 MG CAPS Take by mouth.  . vitamin B-12 (CYANOCOBALAMIN) 1000 MCG tablet   . vitamin C (ASCORBIC ACID) 500 MG tablet Take 500 mg by mouth daily.   No facility-administered encounter medications on file as of 03/04/2021.    Allergies (verified) Patient has no known allergies.   History: Past Medical History:  Diagnosis Date  . Allergy    Seasonal  . Arthritis   . Chicken pox   .  Hyperlipidemia   . Hypertension   . Urinary incontinence    Past Surgical History:  Procedure Laterality Date  . ABDOMINAL HYSTERECTOMY  1986  . BREAST EXCISIONAL BIOPSY Left 1970's   benign  . BREAST SURGERY  1968  . CATARACT EXTRACTION  2014/2015   Both eyes  . FRACTURE SURGERY  01/2009   Fractured Left ankle, plate on outside of ankle 2 rods through/across ankle from inside of ankle  . OOPHORECTOMY    . TONSILLECTOMY AND ADENOIDECTOMY     Family History  Problem Relation Age of Onset  . Arthritis Mother   . Hypertension Mother   . Arthritis Father   . Hypertension Father   . Arthritis Maternal Grandmother   . Cancer Brother        Colon  . Cancer Maternal Aunt        Breast Cancer  . Breast cancer Maternal Aunt   . Breast cancer Cousin   . Breast cancer Cousin   . Hyperlipidemia Son   . Hyperlipidemia Son    Social History   Socioeconomic History  . Marital status: Widowed    Spouse name: Not on file  . Number of children: Not on file  . Years of education: Not on file  . Highest education level: Not on file  Occupational History  . Not on file  Tobacco Use  . Smoking status: Never Smoker  . Smokeless tobacco: Never  Used  Vaping Use  . Vaping Use: Never used  Substance and Sexual Activity  . Alcohol use: No    Alcohol/week: 0.0 standard drinks  . Drug use: No  . Sexual activity: Never    Partners: Male  Other Topics Concern  . Not on file  Social History Narrative   Widowed   Retired   Children 3   Pets: none   Caffeine- Coffee, Tea, rare soda   Social Determinants of Health   Financial Resource Strain: Low Risk   . Difficulty of Paying Living Expenses: Not hard at all  Food Insecurity: No Food Insecurity  . Worried About Charity fundraiser in the Last Year: Never true  . Ran Out of Food in the Last Year: Never true  Transportation Needs: No Transportation Needs  . Lack of Transportation (Medical): No  . Lack of Transportation (Non-Medical):  No  Physical Activity: Sufficiently Active  . Days of Exercise per Week: 5 days  . Minutes of Exercise per Session: 30 min  Stress: No Stress Concern Present  . Feeling of Stress : Not at all  Social Connections: Moderately Integrated  . Frequency of Communication with Friends and Family: Twice a week  . Frequency of Social Gatherings with Friends and Family: Twice a week  . Attends Religious Services: 1 to 4 times per year  . Active Member of Clubs or Organizations: Yes  . Attends Archivist Meetings: 1 to 4 times per year  . Marital Status: Widowed    Tobacco Counseling Counseling given: Not Answered   Clinical Intake:  Pre-visit preparation completed: Yes        Diabetes: No  How often do you need to have someone help you when you read instructions, pamphlets, or other written materials from your doctor or pharmacy?: 1 - Never   Interpreter Needed?: No      Activities of Daily Living In your present state of health, do you have any difficulty performing the following activities: 03/04/2021  Hearing? N  Vision? N  Difficulty concentrating or making decisions? N  Walking or climbing stairs? N  Dressing or bathing? N  Doing errands, shopping? N  Preparing Food and eating ? N  Using the Toilet? N  In the past six months, have you accidently leaked urine? N  Do you have problems with loss of bowel control? N  Managing your Medications? N  Managing your Finances? N  Housekeeping or managing your Housekeeping? N  Some recent data might be hidden    Patient Care Team: Leone Haven, MD as PCP - General (Family Medicine)  Indicate any recent Medical Services you may have received from other than [facility] providers in the past year (date may be approximate).     Assessment:   This is a routine wellness examination for [name].  Hearing/Vision screen  Hearing Screening   125Hz  250Hz  500Hz  1000Hz  2000Hz  3000Hz  4000Hz  6000Hz  8000Hz   Right ear:            Left ear:           Comments: Patient is able to hear conversational tones without difficulty.  No issues reported.  Vision Screening Comments: Followed by [facility]  Wears reading glasses  Cataract extraction, bilateral  Visual acuity not assessed per patient preference  Dietary issues and exercise activities discussed: Current Exercise Habits: Home exercise routine  Healthy diet Good water intake  Physically active.   Goals Addressed  This Visit's Progress     Patient Stated   .  Maintain Healthy Lifestyle (pt-stated)   On track     Stay active Healthy diet Good water intake      Other   .  COMPLETED: DIET - INCREASE WATER INTAKE        Low cholesterol diet      Depression Screen PHQ 2/9 Scores 03/04/2021 12/24/2020 06/18/2020 03/01/2020 08/29/2019 03/01/2019 02/14/2018  PHQ - 2 Score 0 0 0 0 0 0 0  PHQ- 9 Score - - - - - - -    Fall Risk Fall Risk  03/04/2021 12/24/2020 06/18/2020 03/01/2020 03/01/2020  Falls in the past year? 0 0 0 0 0  Number falls in past yr: 0 0 0 - -  Injury with Fall? 0 - - - -  Follow up Falls evaluation completed Falls evaluation completed Falls evaluation completed Falls evaluation completed Falls evaluation completed    [place]: Handrails in use when climbing stairs? Yes Home free of loose throw rugs in walkways, pet beds, electrical cords, etc? Yes  Adequate lighting in your home to reduce risk of falls? Yes   ASSISTIVE DEVICES UTILIZED TO PREVENT FALLS: Life alert? No  Use of a cane, walker or w/c? No    TIMED UP AND GO: Was the test performed? Yes .  Steady gait. Quick pace without the use of assistance.   Cognitive Function: Patient is alert and oriented x3.  Denies difficulty focusing, making decisions, memory loss.  Enjoys brain health activities.  Volunteer at hospital.  MMSE/6CIT deferred. Normal by direct communication/observation.   MMSE - Mini Mental State  Exam 02/14/2018 02/11/2017  Orientation to time 5 5  Orientation to Place 5 5  Registration 3 3  Attention/ Calculation 5 5  Recall 3 3  Language- name 2 objects 2 2  Language- repeat 1 1  Language- follow 3 step command 3 3  Language- read & follow direction 1 1  Write a sentence 1 1  Copy design 1 1  Total score 30 30     6CIT Screen 03/01/2020 03/01/2019  What Year? 0 points 0 points  What month? 0 points 0 points  What time? - 0 points  Count back from 20 - 0 points  Months in reverse 0 points 0 points  Repeat phrase - 0 points  Total Score - 0    Immunizations Immunization History  Administered Date(s) Administered  . Influenza, High Dose Seasonal PF 08/01/2016, 07/16/2017, 07/06/2018, 08/01/2019  . PFIZER(Purple Top)SARS-COV-2 Vaccination 11/06/2019, 11/28/2019, 08/09/2020  . Pneumococcal-Unspecified 01/25/2012  . Tdap 01/21/2015  . Zoster Recombinat (Shingrix) 07/26/2020   Health Maintenance Health Maintenance  Topic Date Due  . INFLUENZA VACCINE  05/19/2021  . TETANUS/TDAP  01/20/2025  . DEXA SCAN  Completed  . COVID-19 Vaccine  Completed  . PNA vac Low Risk Adult  Completed  . HPV VACCINES  Aged Out   Colorectal cancer screening: No longer required.   Mammogram status: No longer required due to aged out.   Lung Cancer Screening: (Low Dose CT Chest recommended if Age 47-80 years, 30 pack-year currently smoking OR have quit w/in 15years.) does not qualify.   Hepatitis C Screening: does not qualify.  Vision Screening: Recommended annual ophthalmology exams for early detection of glaucoma and other disorders of the eye. Is the patient up to date with their annual eye exam?  Yes   Dental Screening: Recommended annual dental exams for  proper oral hygiene.  Community Resource Referral / Chronic Care Management: CRR required this visit?  No   CCM required this visit?  No      Plan:   Keep all routine maintenance appointments.   I have personally  reviewed and noted the following in the patient's chart:   . Medical and social history . Use of alcohol, tobacco or illicit drugs  . Current medications and supplements including opioid prescriptions. Patient does not take opioids.  . Functional ability and status . Nutritional status . Physical activity . Advanced directives . List of other physicians . Hospitalizations, surgeries, and ER visits in previous 12 months . Vitals . Screenings to include cognitive, depression, and falls . Referrals and appointments  In addition, I have reviewed and discussed with patient certain preventive protocols, quality metrics, and best practice recommendations. A written personalized care plan for preventive services as well as general preventive health recommendations were provided to patient.     Varney Biles, LPN   [id number]

## 2021-03-04 NOTE — Patient Instructions (Addendum)
Jane Willis , Thank you for taking time to come for your Medicare Wellness Visit. I appreciate your ongoing commitment to your health goals. Please review the following plan we discussed and let me know if I can assist you in the future.   These are the goals we discussed: Goals      Patient Stated   .  Maintain Healthy Lifestyle (pt-stated)      Stay active Healthy diet Good water intake       This is a list of the screening recommended for you and due dates:  Health Maintenance  Topic Date Due  . Flu Shot  05/19/2021  . Tetanus Vaccine  01/20/2025  . DEXA scan (bone density measurement)  Completed  . COVID-19 Vaccine  Completed  . Pneumonia vaccines  Completed  . HPV Vaccine  Aged Out    Immunizations Immunization History  Administered Date(s) Administered  . Influenza, High Dose Seasonal PF 08/01/2016, 07/16/2017, 07/06/2018, 08/01/2019  . PFIZER(Purple Top)SARS-COV-2 Vaccination 11/06/2019, 11/28/2019  . Pneumococcal-Unspecified 01/25/2012  . Tdap 01/21/2015   Advanced directives: End of life planning; Advance aging; Advanced directives discussed.  Copy of current HCPOA/Living Will requested.    Conditions/risks identified: none new  Follow up in one year for your annual wellness visit   Preventive Care 65 Years and Older, Female Preventive care refers to lifestyle choices and visits with your health care provider that can promote health and wellness. What does preventive care include?  A yearly physical exam. This is also called an annual well check.  Dental exams once or twice a year.  Routine eye exams. Ask your health care provider how often you should have your eyes checked.  Personal lifestyle choices, including:  Daily care of your teeth and gums.  Regular physical activity.  Eating a healthy diet.  Avoiding tobacco and drug use.  Limiting alcohol use.  Practicing safe sex.  Taking low-dose aspirin every day.  Taking vitamin and mineral  supplements as recommended by your health care provider. What happens during an annual well check? The services and screenings done by your health care provider during your annual well check will depend on your age, overall health, lifestyle risk factors, and family history of disease. Counseling  Your health care provider may ask you questions about your:  Alcohol use.  Tobacco use.  Drug use.  Emotional well-being.  Home and relationship well-being.  Sexual activity.  Eating habits.  History of falls.  Memory and ability to understand (cognition).  Work and work Statistician.  Reproductive health. Screening  You may have the following tests or measurements:  Height, weight, and BMI.  Blood pressure.  Lipid and cholesterol levels. These may be checked every 5 years, or more frequently if you are over 72 years old.  Skin check.  Lung cancer screening. You may have this screening every year starting at age 22 if you have a 30-pack-year history of smoking and currently smoke or have quit within the past 15 years.  Fecal occult blood test (FOBT) of the stool. You may have this test every year starting at age 9.  Flexible sigmoidoscopy or colonoscopy. You may have a sigmoidoscopy every 5 years or a colonoscopy every 10 years starting at age 44.  Hepatitis C blood test.  Hepatitis B blood test.  Sexually transmitted disease (STD) testing.  Diabetes screening. This is done by checking your blood sugar (glucose) after you have not eaten for a while (fasting). You may have this done every  1-3 years.  Bone density scan. This is done to screen for osteoporosis. You may have this done starting at age 64.  Mammogram. This may be done every 1-2 years. Talk to your health care provider about how often you should have regular mammograms. Talk with your health care provider about your test results, treatment options, and if necessary, the need for more tests. Vaccines  Your  health care provider may recommend certain vaccines, such as:  Influenza vaccine. This is recommended every year.  Tetanus, diphtheria, and acellular pertussis (Tdap, Td) vaccine. You may need a Td booster every 10 years.  Zoster vaccine. You may need this after age 29.  Pneumococcal 13-valent conjugate (PCV13) vaccine. One dose is recommended after age 46.  Pneumococcal polysaccharide (PPSV23) vaccine. One dose is recommended after age 64. Talk to your health care provider about which screenings and vaccines you need and how often you need them. This information is not intended to replace advice given to you by your health care provider. Make sure you discuss any questions you have with your health care provider. Document Released: 11/01/2015 Document Revised: 06/24/2016 Document Reviewed: 08/06/2015 Elsevier Interactive Patient Education  2017 [employer] Prevention in the Home Falls can cause injuries. They can happen to people of all ages. There are many things you can do to make your home safe and to help prevent falls. What can I do on the outside of my home?  Regularly fix the edges of walkways and driveways and fix any cracks.  Remove anything that might make you trip as you walk through a door, such as a raised step or threshold.  Trim any bushes or trees on the path to your home.  Use bright outdoor lighting.  Clear any walking paths of anything that might make someone trip, such as rocks or tools.  Regularly check to see if handrails are loose or broken. Make sure that both sides of any steps have handrails.  Any raised decks and porches should have guardrails on the edges.  Have any leaves, snow, or ice cleared regularly.  Use sand or salt on walking paths during winter.  Clean up any spills in your garage right away. This includes oil or grease spills. What can I do in the bathroom?  Use night lights.  Install grab bars by the toilet and in the tub and  shower. Do not use towel bars as grab bars.  Use non-skid mats or decals in the tub or shower.  If you need to sit down in the shower, use a plastic, non-slip stool.  Keep the floor dry. Clean up any water that spills on the floor as soon as it happens.  Remove soap buildup in the tub or shower regularly.  Attach bath mats securely with double-sided non-slip rug tape.  Do not have throw rugs and other things on the floor that can make you trip. What can I do in the bedroom?  Use night lights.  Make sure that you have a light by your bed that is easy to reach.  Do not use any sheets or blankets that are too big for your bed. They should not hang down onto the floor.  Have a firm chair that has side arms. You can use this for support while you get dressed.  Do not have throw rugs and other things on the floor that can make you trip. What can I do in the kitchen?  Clean up any spills right away.  Avoid walking on wet floors.  Keep items that you use a lot in easy-to-reach places.  If you need to reach something above you, use a strong step stool that has a grab bar.  Keep electrical cords out of the way.  Do not use floor polish or wax that makes floors slippery. If you must use wax, use non-skid floor wax.  Do not have throw rugs and other things on the floor that can make you trip. What can I do with my stairs?  Do not leave any items on the stairs.  Make sure that there are handrails on both sides of the stairs and use them. Fix handrails that are broken or loose. Make sure that handrails are as long as the stairways.  Check any carpeting to make sure that it is firmly attached to the stairs. Fix any carpet that is loose or worn.  Avoid having throw rugs at the top or bottom of the stairs. If you do have throw rugs, attach them to the floor with carpet tape.  Make sure that you have a light switch at the top of the stairs and the bottom of the stairs. If you do not  have them, ask someone to add them for you. What else can I do to help prevent falls?  Wear shoes that:  Do not have high heels.  Have rubber bottoms.  Are comfortable and fit you well.  Are closed at the toe. Do not wear sandals.  If you use a stepladder:  Make sure that it is fully opened. Do not climb a closed stepladder.  Make sure that both sides of the stepladder are locked into place.  Ask someone to hold it for you, if possible.  Clearly mark and make sure that you can see:  Any grab bars or handrails.  First and last steps.  Where the edge of each step is.  Use tools that help you move around (mobility aids) if they are needed. These include:  Canes.  Walkers.  Scooters.  Crutches.  Turn on the lights when you go into a dark area. Replace any light bulbs as soon as they burn out.  Set up your furniture so you have a clear path. Avoid moving your furniture around.  If any of your floors are uneven, fix them.  If there are any pets around you, be aware of where they are.  Review your medicines with your doctor. Some medicines can make you feel dizzy. This can increase your chance of falling. Ask your doctor what other things that you can do to help prevent falls. This information is not intended to replace advice given to you by your health care provider. Make sure you discuss any questions you have with your health care provider. Document Released: 08/01/2009 Document Revised: 03/12/2016 Document Reviewed: 11/09/2014 Elsevier Interactive Patient Education  2017 [employer].

## 2021-07-01 ENCOUNTER — Other Ambulatory Visit: Payer: Self-pay | Admitting: Family Medicine

## 2021-07-01 DIAGNOSIS — I10 Essential (primary) hypertension: Secondary | ICD-10-CM

## 2021-07-08 ENCOUNTER — Other Ambulatory Visit: Payer: Self-pay | Admitting: Orthopedic Surgery

## 2021-07-08 DIAGNOSIS — M48061 Spinal stenosis, lumbar region without neurogenic claudication: Secondary | ICD-10-CM

## 2021-07-09 ENCOUNTER — Ambulatory Visit
Admission: RE | Admit: 2021-07-09 | Discharge: 2021-07-09 | Disposition: A | Discharge status: Home / Self Care | Payer: Medicare Other | Source: Ambulatory Visit | Attending: Orthopedic Surgery | Admitting: Orthopedic Surgery

## 2021-07-09 ENCOUNTER — Other Ambulatory Visit: Payer: Self-pay

## 2021-07-09 DIAGNOSIS — M48061 Spinal stenosis, lumbar region without neurogenic claudication: Secondary | ICD-10-CM | POA: Insufficient documentation

## 2021-07-09 IMAGING — MR MR LUMBAR SPINE W/O CM
4 of 5 series · 33 of 48 positions shown · non-contrast
Comparison: None.

CLINICAL DATA: Low back pain with severe pain in the buttocks.

EXAM:
MRI LUMBAR SPINE WITHOUT CONTRAST
TECHNIQUE: Multiplanar, multisequence MR imaging of the lumbar spine was
performed. No intravenous contrast was administered.

[Series 5: T2 · sagittal · 4.0mm · 0.81mm/px · 8 of 17 slices shown (1 of 2)]
[im 1/17]
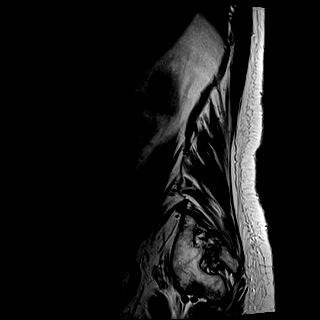
[im 3/17]
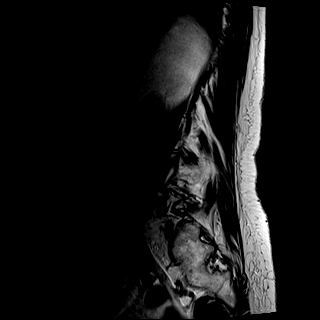
[im 5/17]
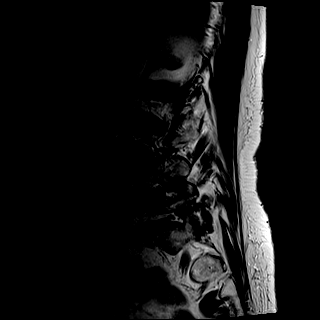
[im 7/17]
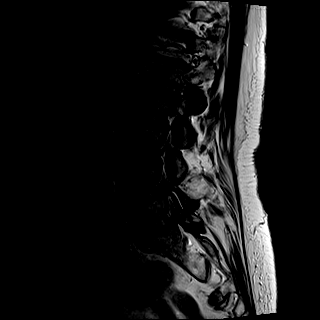
[im 10/17]
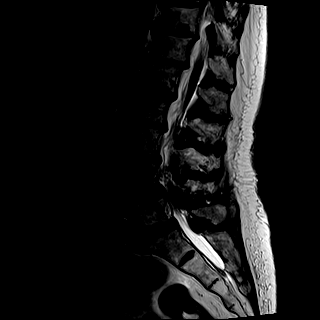
[im 12/17]
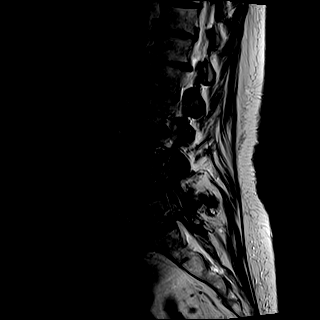
[im 14/17]
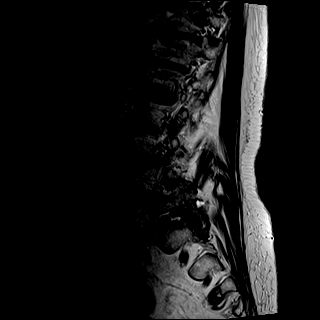
[im 17/17]
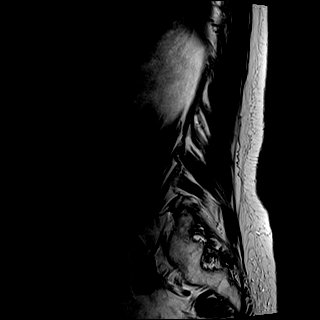

[Series 6: T1 · sagittal · 4.0mm · 0.81mm/px · 7 of 17 slices shown (1 of 2)]
[im 1/17]
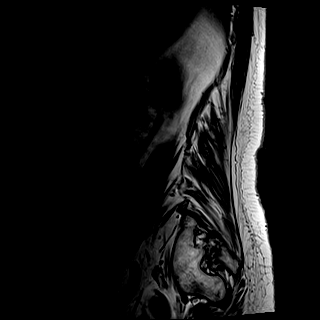
[im 3/17]
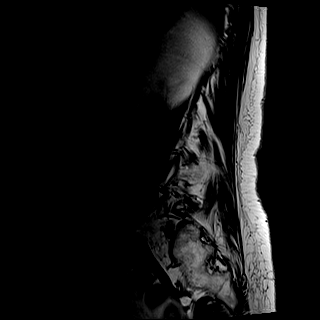
[im 6/17]
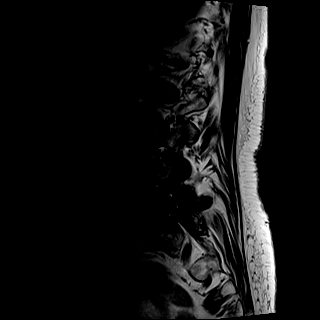
[im 9/17]
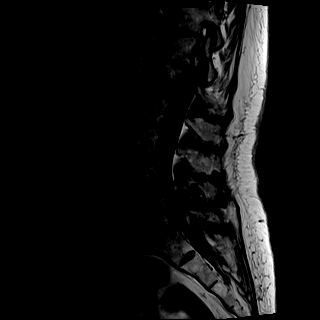
[im 11/17]
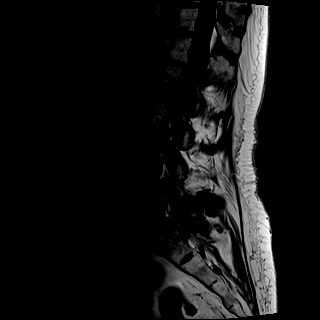
[im 14/17]
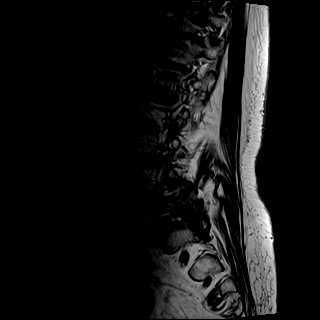
[im 17/17]
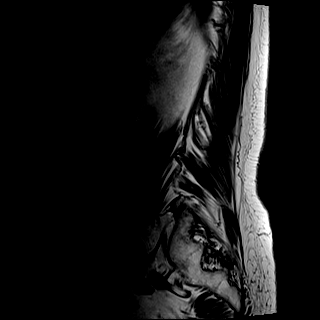

[Series 8: T2 · axial · 4.0mm · 0.78mm/px · z∈[-34,+146]mm · 9 of 32 slices shown (2 of 2)]
[im 1/32]
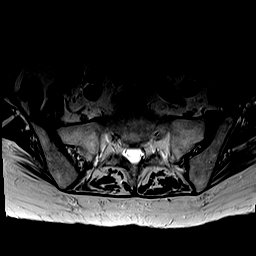
[im 6/32]
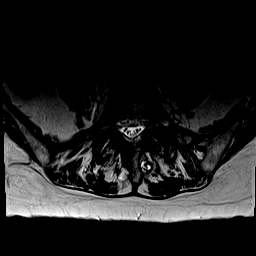
[im 11/32]
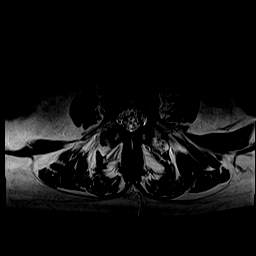
[im 13/32]
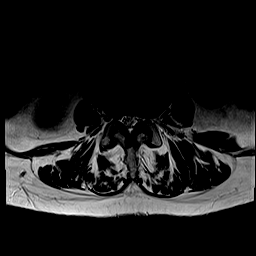
[im 16/32]
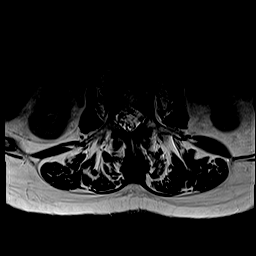
[im 19/32]
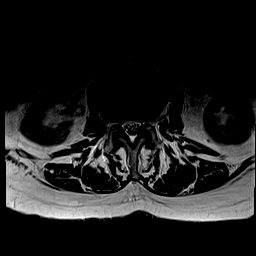
[im 21/32]
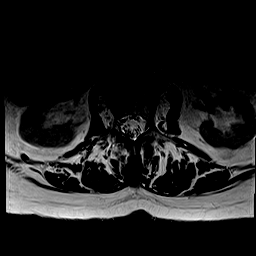
[im 26/32]
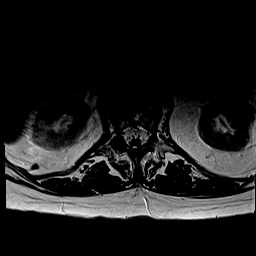
[im 32/32]
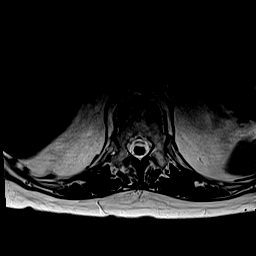

[Series 9: T1 · axial · 4.0mm · 0.39mm/px · z∈[-34,+146]mm · 9 of 32 slices shown (2 of 2)]
[im 1/32]
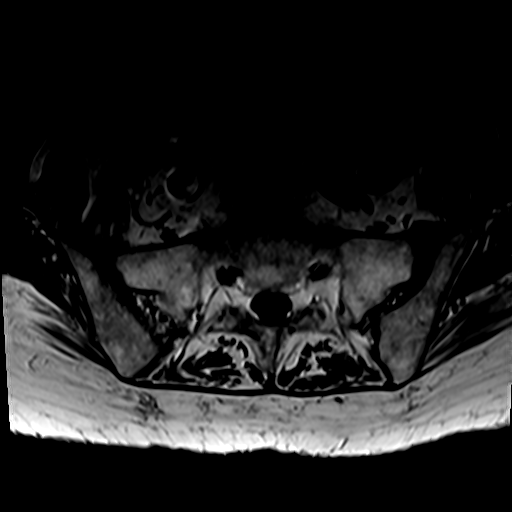
[im 6/32]
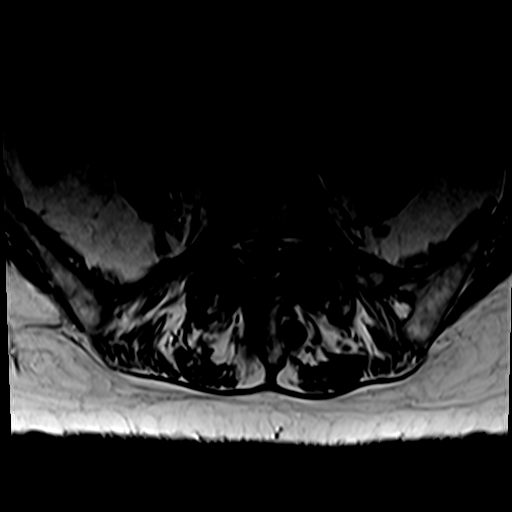
[im 11/32]
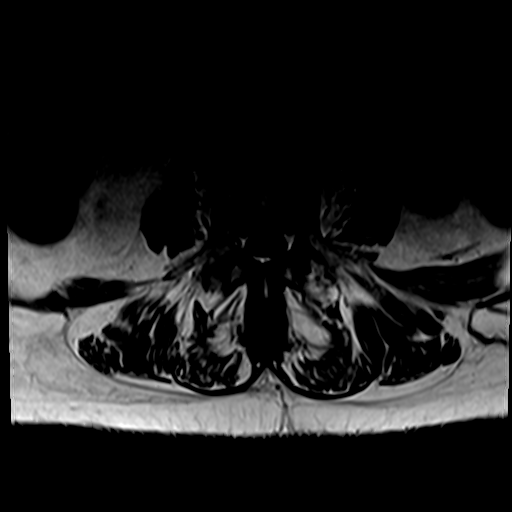
[im 13/32]
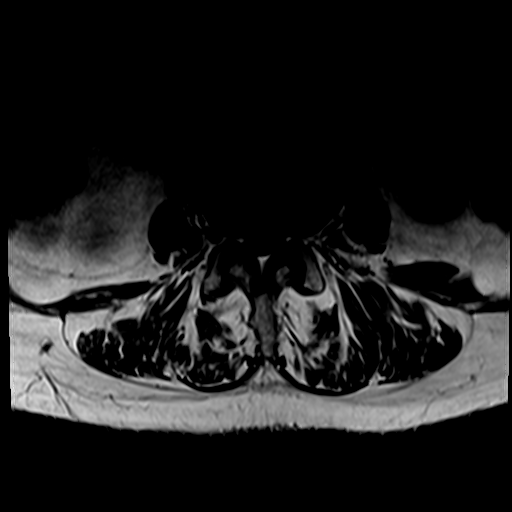
[im 16/32]
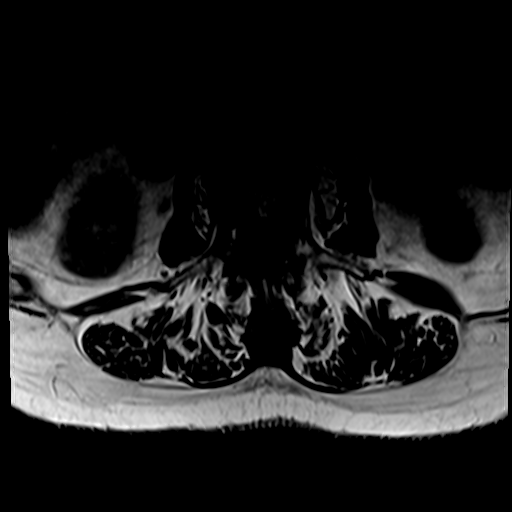
[im 19/32]
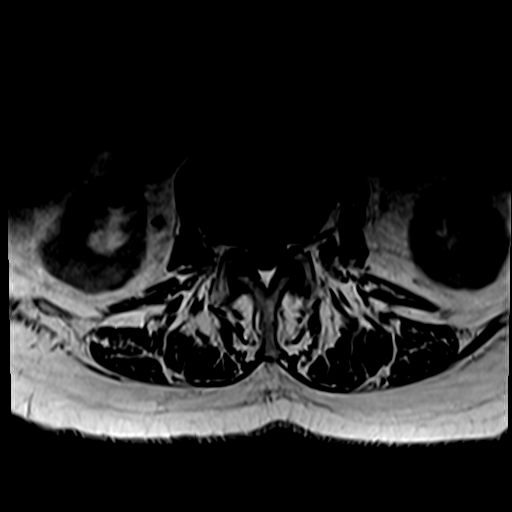
[im 21/32]
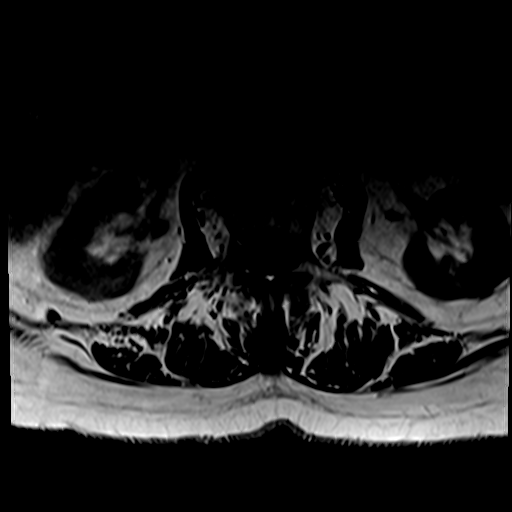
[im 26/32]
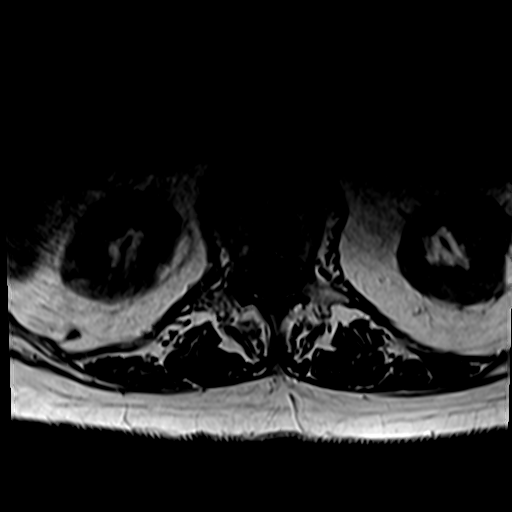
[im 32/32]
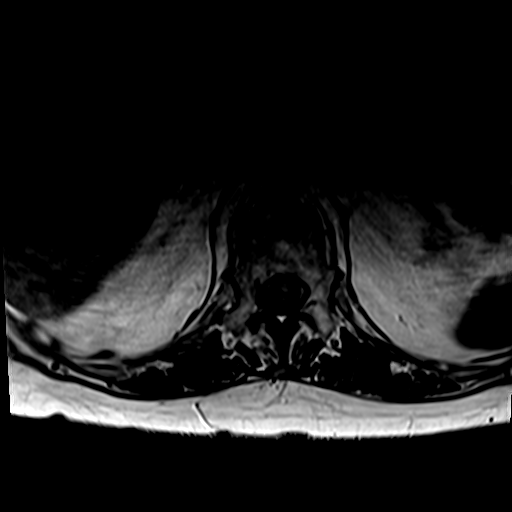

[33 of 48 positions shown; findings below may reference images not displayed]

FINDINGS: Segmentation:  Standard.

Alignment: Dextrocurvature of the lumbar spine. 2 mm retrolisthesis
T12 on L1 and L1 on L2. Grade 1 anterolisthesis L4 on L5 and L5 on
S1.

Vertebrae: No acute fracture, evidence of discitis, or suspicious
osseous lesion. Increased T2 signal and decreased T1 signal at the
endplates at L4-L5, and at L2-L3 to a lesser extent, likely Modic
type 1 changes.

Conus medullaris and cauda equina: Conus extends to the L2 level.
Conus and cauda equina appear normal.

Paraspinal and other soft tissues: Negative. Severe degenerative
changes about the spinous processes of L4 and L5, with remodeling of
the L4 spinous process secondary to a 8 mm cyst, and increased T2
signal between the spinous processes.

Disc levels:

T12-L1: Severe disc height loss and trace retrolisthesis. Minimal
spinal canal stenosis. Mild bilateral neural foraminal narrowing.

L1-L2: Disc height loss with trace retrolisthesis and disc
osteophyte complex. Mild facet arthropathy. Mild spinal canal
stenosis. Severe right and mild left neural foraminal narrowing.

L2-L3: Disc height loss with broad-based disc bulge and superimposed
right subarticular extrusion with 4 mm of caudal migration. Mild
facet arthropathy. No spinal canal stenosis. There is effacement of
the left lateral recess. Mild right neural foraminal narrowing.

L3-L4: Disc height loss with left eccentric disc bulge with left
subarticular protrusion, which effaces the left lateral recess. Mild
facet arthropathy. Minimal spinal canal stenosis. Mild left and
moderate right neural foraminal narrowing.

L4-L5: Grade 1 anterolisthesis with disc unroofing and broad-based
disc bulge, which effaces the lateral recesses. Severe facet
arthropathy with ligamentum flavum hypertrophy. Severe spinal canal
stenosis. Severe left and moderate right neural foraminal narrowing.

L5-S1: Grade 1 anterolisthesis with disc unroofing. Severe facet
arthropathy. No spinal canal stenosis. Mild left and moderate right
neural foraminal narrowing.
IMPRESSION: 1. L4-L5 severe spinal canal stenosis, with severe left and moderate
right neural foraminal narrowing.
2. L1-L2 mild spinal canal stenosis with severe right and mild left
neural foraminal narrowing.
3. L3-L4 and L5-S1 moderate right neural foraminal narrowing.
4. Effacement of the lateral recesses bilaterally at L4-L5 and on
the left at L2-L3 and L3-L4, which may affect the descending left L3
and L4 nerves and the descending bilateral L5 nerves.
5. Increased T2 signal between the spinous processes of L4 and L5,
with cystic changes, likely Baastrup's disease, which can cause of
pain.

## 2021-08-06 ENCOUNTER — Observation Stay: Payer: Medicare Other

## 2021-08-06 ENCOUNTER — Telehealth: Payer: Self-pay | Admitting: Family Medicine

## 2021-08-06 ENCOUNTER — Other Ambulatory Visit: Payer: Self-pay

## 2021-08-06 ENCOUNTER — Observation Stay
Admission: EM | Admit: 2021-08-06 | Discharge: 2021-08-09 | Disposition: A | Discharge status: Home / Self Care | Payer: Medicare Other | Attending: Internal Medicine | Admitting: Internal Medicine

## 2021-08-06 ENCOUNTER — Emergency Department: Payer: Medicare Other

## 2021-08-06 DIAGNOSIS — R7303 Prediabetes: Secondary | ICD-10-CM | POA: Diagnosis not present

## 2021-08-06 DIAGNOSIS — S06330A Contusion and laceration of cerebrum, unspecified, without loss of consciousness, initial encounter: Secondary | ICD-10-CM | POA: Diagnosis present

## 2021-08-06 DIAGNOSIS — I10 Essential (primary) hypertension: Secondary | ICD-10-CM | POA: Diagnosis present

## 2021-08-06 DIAGNOSIS — Z79899 Other long term (current) drug therapy: Secondary | ICD-10-CM | POA: Insufficient documentation

## 2021-08-06 DIAGNOSIS — S065X0A Traumatic subdural hemorrhage without loss of consciousness, initial encounter: Principal | ICD-10-CM | POA: Insufficient documentation

## 2021-08-06 DIAGNOSIS — Z800 Family history of malignant neoplasm of digestive organs: Secondary | ICD-10-CM

## 2021-08-06 DIAGNOSIS — S065XAA Traumatic subdural hemorrhage with loss of consciousness status unknown, initial encounter: Secondary | ICD-10-CM | POA: Diagnosis present

## 2021-08-06 DIAGNOSIS — R2689 Other abnormalities of gait and mobility: Secondary | ICD-10-CM | POA: Diagnosis not present

## 2021-08-06 DIAGNOSIS — Z7982 Long term (current) use of aspirin: Secondary | ICD-10-CM | POA: Insufficient documentation

## 2021-08-06 DIAGNOSIS — E785 Hyperlipidemia, unspecified: Secondary | ICD-10-CM | POA: Diagnosis present

## 2021-08-06 DIAGNOSIS — R112 Nausea with vomiting, unspecified: Secondary | ICD-10-CM

## 2021-08-06 DIAGNOSIS — Z20822 Contact with and (suspected) exposure to covid-19: Secondary | ICD-10-CM | POA: Diagnosis not present

## 2021-08-06 DIAGNOSIS — S062XAA Diffuse traumatic brain injury with loss of consciousness status unknown, initial encounter: Secondary | ICD-10-CM | POA: Diagnosis present

## 2021-08-06 DIAGNOSIS — W109XXA Fall (on) (from) unspecified stairs and steps, initial encounter: Secondary | ICD-10-CM | POA: Insufficient documentation

## 2021-08-06 DIAGNOSIS — R41841 Cognitive communication deficit: Secondary | ICD-10-CM | POA: Diagnosis not present

## 2021-08-06 DIAGNOSIS — R410 Disorientation, unspecified: Secondary | ICD-10-CM

## 2021-08-06 DIAGNOSIS — E538 Deficiency of other specified B group vitamins: Secondary | ICD-10-CM | POA: Diagnosis present

## 2021-08-06 DIAGNOSIS — Z8616 Personal history of COVID-19: Secondary | ICD-10-CM | POA: Diagnosis present

## 2021-08-06 DIAGNOSIS — W19XXXA Unspecified fall, initial encounter: Secondary | ICD-10-CM

## 2021-08-06 DIAGNOSIS — S0633AA Contusion and laceration of cerebrum, unspecified, with loss of consciousness status unknown, initial encounter: Secondary | ICD-10-CM

## 2021-08-06 LAB — COMPREHENSIVE METABOLIC PANEL
ALT: 15 U/L (ref 0–44)
AST: 25 U/L (ref 15–41)
Albumin: 3.9 g/dL (ref 3.5–5.0)
Alkaline Phosphatase: 70 U/L (ref 38–126)
Anion gap: 9 (ref 5–15)
BUN: 15 mg/dL (ref 8–23)
CO2: 25 mmol/L (ref 22–32)
Calcium: 9 mg/dL (ref 8.9–10.3)
Chloride: 101 mmol/L (ref 98–111)
Creatinine, Ser: 0.84 mg/dL (ref 0.44–1.00)
GFR, Estimated: >60 mL/min (ref >60)
Glucose, Bld: 118 mg/dL — ABNORMAL HIGH (ref 70–99)
Potassium: 3.4 mmol/L — ABNORMAL LOW (ref 3.5–5.1)
Sodium: 135 mmol/L (ref 135–145)
Total Bilirubin: 1.2 mg/dL (ref 0.3–1.2)
Total Protein: 7.6 g/dL (ref 6.5–8.1)

## 2021-08-06 LAB — CBC
HCT: 41.9 % (ref 36.0–46.0)
Hemoglobin: 14 g/dL (ref 12.0–15.0)
MCH: 29.9 pg (ref 26.0–34.0)
MCHC: 33.4 g/dL (ref 30.0–36.0)
MCV: 89.5 fL (ref 80.0–100.0)
Platelets: 317 K/uL (ref 150–400)
RBC: 4.68 MIL/uL (ref 3.87–5.11)
RDW: 13.7 % (ref 11.5–15.5)
WBC: 6.9 K/uL (ref 4.0–10.5)
nRBC: 0 % (ref 0.0–0.2)

## 2021-08-06 LAB — RESP PANEL BY RT-PCR (FLU A&B, COVID) ARPGX2
Influenza A by PCR: NEGATIVE
Influenza B by PCR: NEGATIVE
SARS Coronavirus 2 by RT PCR: NEGATIVE

## 2021-08-06 LAB — LIPASE, BLOOD: Lipase: 27 U/L (ref 11–51)

## 2021-08-06 IMAGING — MR MR HEAD WO/W CM
16 series · 48 of 48 positions shown · IV contrast (gadavist)
Comparison: CT head [DATE]

CLINICAL DATA: Fall.  Neuro deficit.  Rule out stroke.

EXAM:
MRI HEAD WITHOUT AND WITH CONTRAST
TECHNIQUE: Multiplanar, multiecho pulse sequences of the brain and surrounding
structures were obtained without and with intravenous contrast.
CONTRAST:  6mL GADAVIST GADOBUTROL 1 MMOL/ML IV SOLN

[Series 5: ax dwi_tracew · axial · 3.0mm · 0.71mm/px · z∈[-148,+16]mm · 3 of 56 slices shown]
[im 1/56]
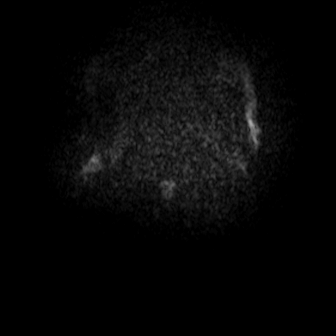
[im 28/56]
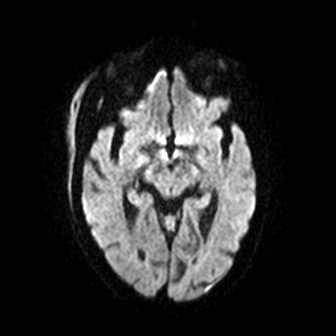
[im 56/56]
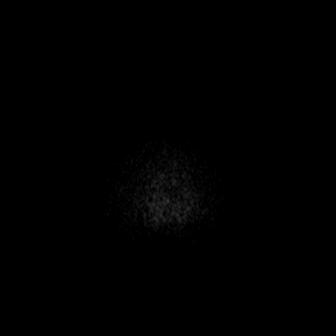

[Series 6: ax dwi_adc · axial · 3.0mm · 0.71mm/px · z∈[-148,+16]mm · 3 of 56 slices shown]
[im 1/56]
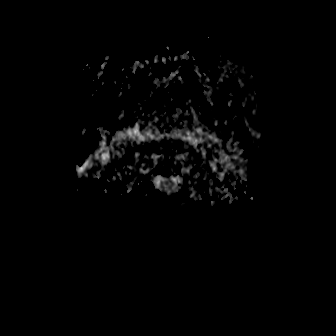
[im 28/56]
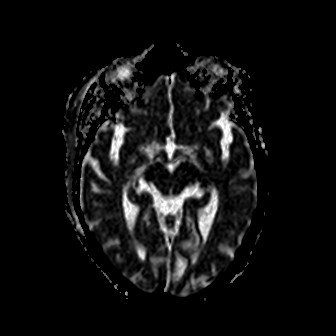
[im 56/56]
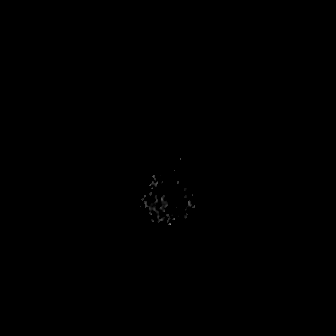

[Series 7: cor dwi_tracew · coronal · 5.0mm · 0.68mm/px · 2 of 40 slices shown]
[im 1/40]
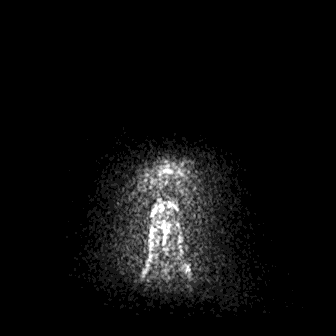
[im 40/40]
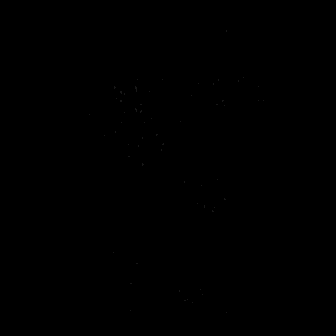

[Series 8: cor dwi_adc · coronal · 5.0mm · 0.68mm/px · 2 of 38 slices shown]
[im 1/38]
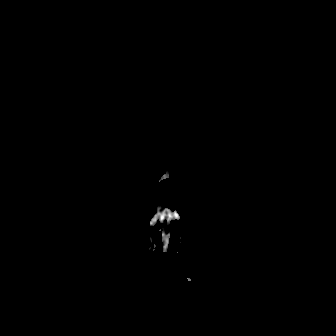
[im 38/38]
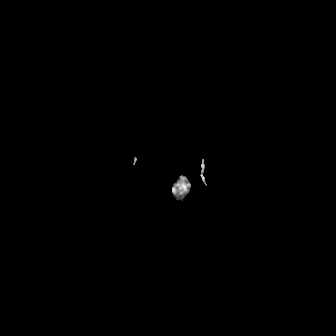

[Series 9: T1 · sagittal · 5.0mm · 0.47mm/px · 1 of 24 slices shown (1 of 2)]
[im 1/24]
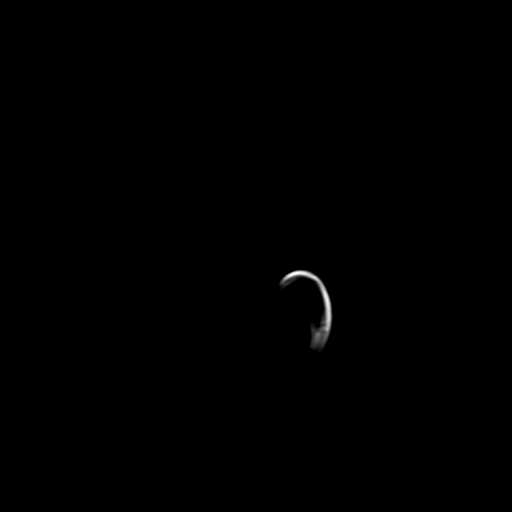

[Series 10: T2 · axial · 5.0mm · 0.86mm/px · 1 of 25 slices shown (1 of 2)]
[im 1/25]
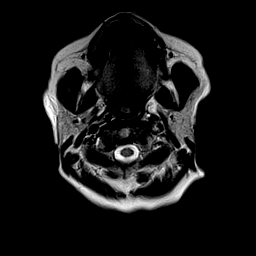

[Series 11: mag_images · axial · 3.0mm · 0.90mm/px · z∈[-139,+13]mm · 3 of 52 slices shown]
[im 1/52]
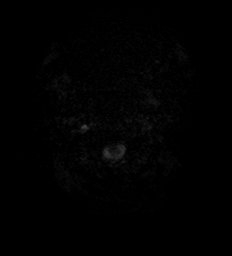
[im 26/52]
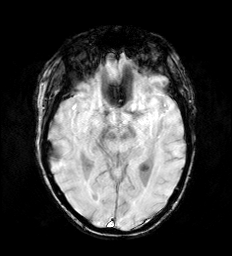
[im 52/52]
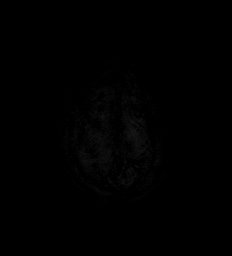

[Series 12: pha_images · axial · 3.0mm · 0.90mm/px · z∈[-139,+13]mm · 3 of 52 slices shown]
[im 1/52]
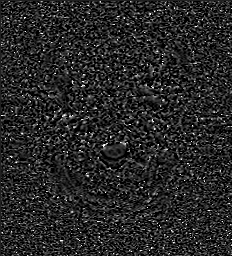
[im 26/52]
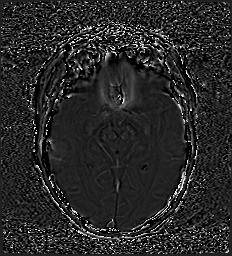
[im 52/52]
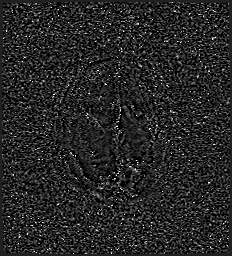

[Series 13: swi_images · axial · 3.0mm · 0.90mm/px · z∈[-139,+13]mm · 3 of 52 slices shown]
[im 1/52]
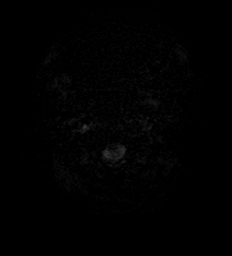
[im 26/52]
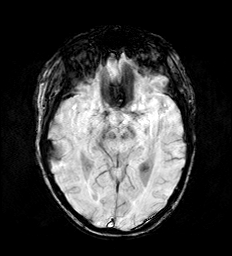
[im 52/52]
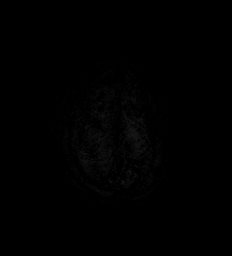

[Series 15: FLAIR · axial · 3.0mm · 0.69mm/px · z∈[-143,+18]mm · 3 of 55 slices shown]
[im 1/55]
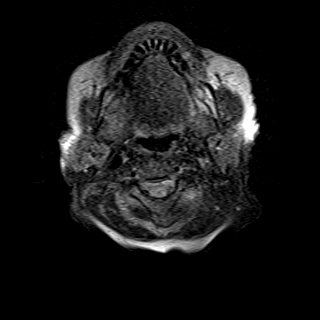
[im 28/55]
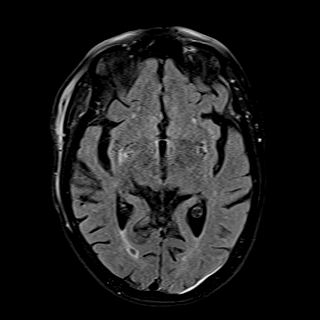
[im 55/55]
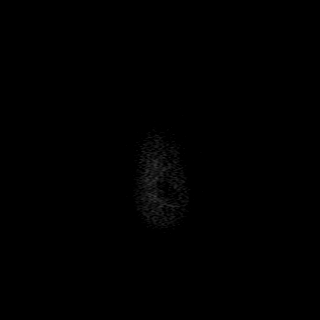

[Series 16: T1 · axial · 1.0mm · 0.98mm/px · z∈[-151,+23]mm · 9 of 176 slices shown (2 of 2)]
[im 1/176]
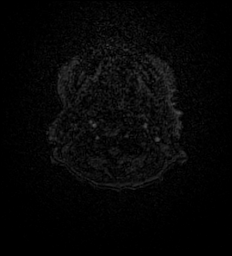
[im 22/176]
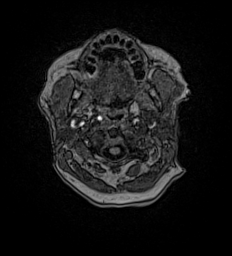
[im 44/176]
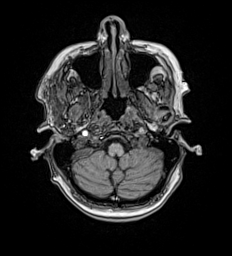
[im 66/176]
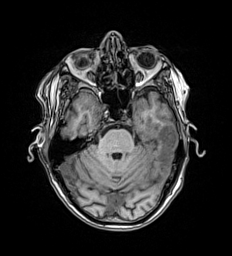
[im 88/176]
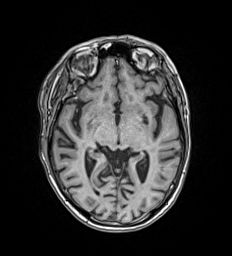
[im 110/176]
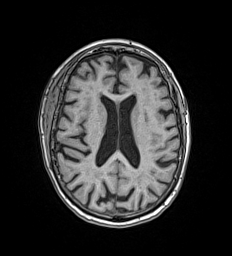
[im 132/176]
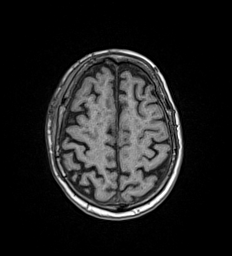
[im 154/176]
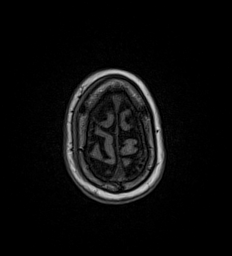
[im 176/176]
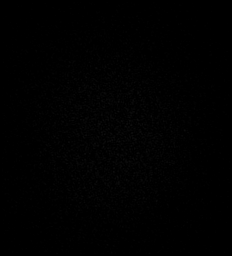

[Series 17: T2 · coronal · 5.0mm · 0.86mm/px · 2 of 30 slices shown (2 of 2)]
[im 1/30]
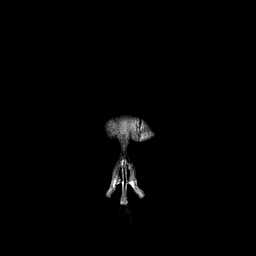
[im 30/30]
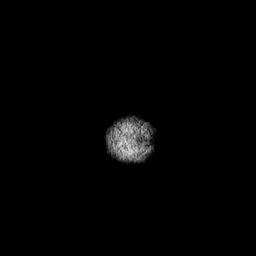

[Series 20: T1 post-contrast · axial · 1.0mm · 0.98mm/px · z∈[-134,+41]mm · 9 of 176 slices shown (1 of 4)]
[im 1/176]
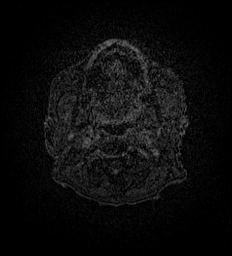
[im 22/176]
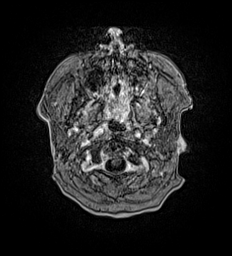
[im 44/176]
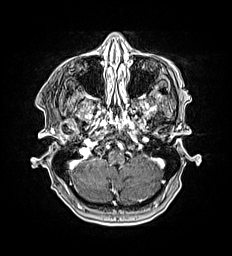
[im 66/176]
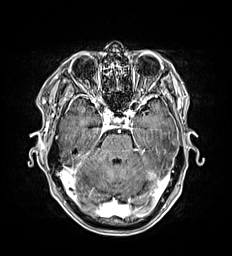
[im 88/176]
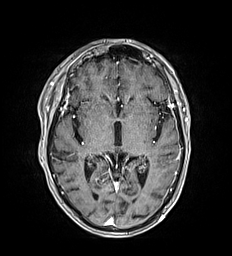
[im 110/176]
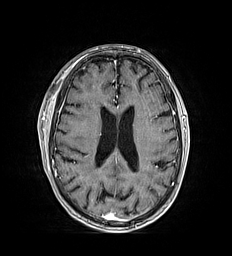
[im 132/176]
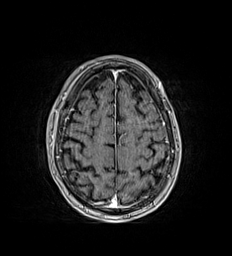
[im 154/176]
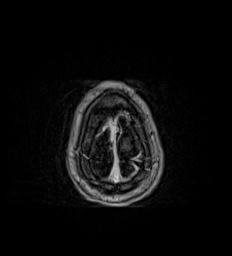
[im 176/176]
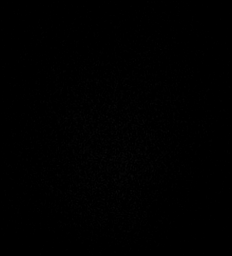

[Series 21: T1 post-contrast · coronal · 5.0mm · 0.69mm/px · 2 of 29 slices shown (2 of 4)]
[im 1/29]
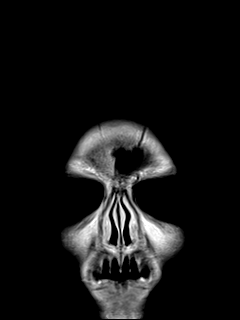
[im 29/29]
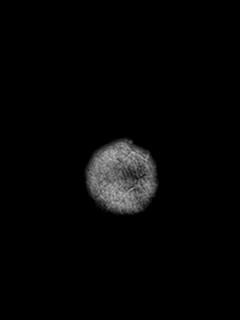

[Series 22: T1 post-contrast · sagittal · 5.0mm · 0.47mm/px · 1 of 23 slices shown (3 of 4)]
[im 1/23]
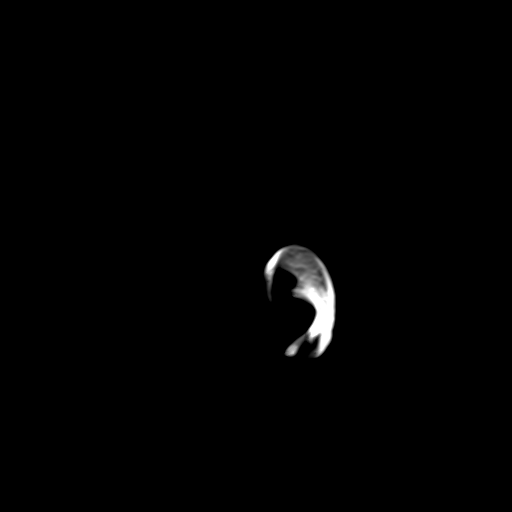

[Series 23: T1 post-contrast · axial · 5.0mm · 0.90mm/px · 1 of 27 slices shown (4 of 4)]
[im 1/27]
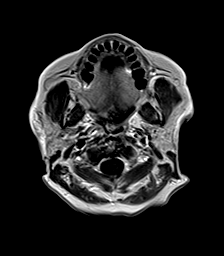

[48 of 48 positions shown; findings below may reference images not displayed]

FINDINGS: Brain: Mild generalized atrophy.  Negative for hydrocephalus.

T2 and FLAIR hyperintensity in the left posterolateral temporal lobe
corresponding to a low-density area on CT earlier today. Small areas
of susceptibility are present in this area, corresponding to acute
hemorrhage. There is mild hyperdense hemorrhage on the coronal CT
from earlier today. Following contrast administration, there is mild
enhancement within the area of edema. Enhancing area is somewhat
ring-like and measures approximately 10 mm. In addition, there is a
small left occipital subdural hematoma. No other enhancing lesion
identified. No areas of restricted diffusion

Vascular: Normal arterial flow voids

Skull and upper cervical spine: No focal skeletal lesion.

Sinuses/Orbits: Mild mucosal edema paranasal sinuses. Bilateral
cataract extraction

Other: None
IMPRESSION: Left temporal lobe lesion corresponds to CT hypodensity. There is
mild hemorrhage and enhancement most compatible with acute
contusion. Small amount of high-density hemorrhage on CT is noted.
Small left occipital subdural hematoma noted on FLAIR. This is
compatible with contrecoup injury with right frontal scalp
hematoma.

## 2021-08-06 IMAGING — CT CT CERVICAL SPINE W/O CM
3 of 4 series · 12 of 33 positions shown, 14 images · non-contrast
Comparison: None.

CLINICAL DATA: Neck trauma.  Fell yesterday, falling down steps

EXAM:
CT CERVICAL SPINE WITHOUT CONTRAST
TECHNIQUE: Multidetector CT imaging of the cervical spine was performed without
intravenous contrast. Multiplanar CT image reconstructions were also
generated.

[Series 6: sagittal bone · sagittal · 0.27mm/px · 5 of 51 slices shown, 6 images]
[im 17/51  bone]
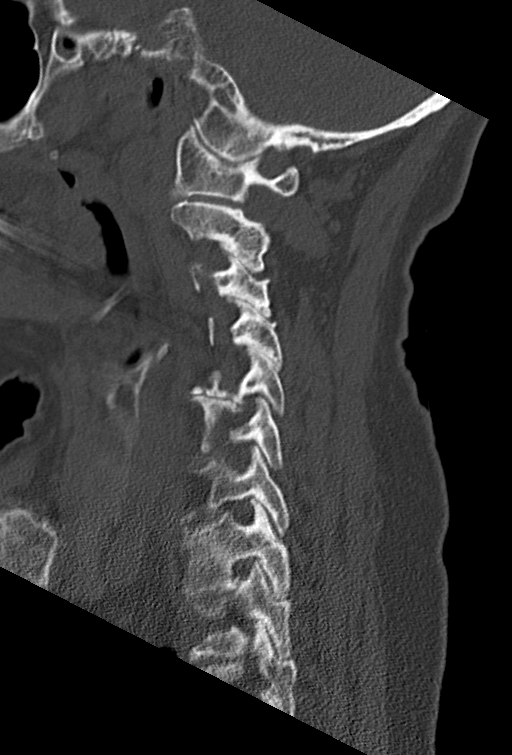
[im 21/51  bone]
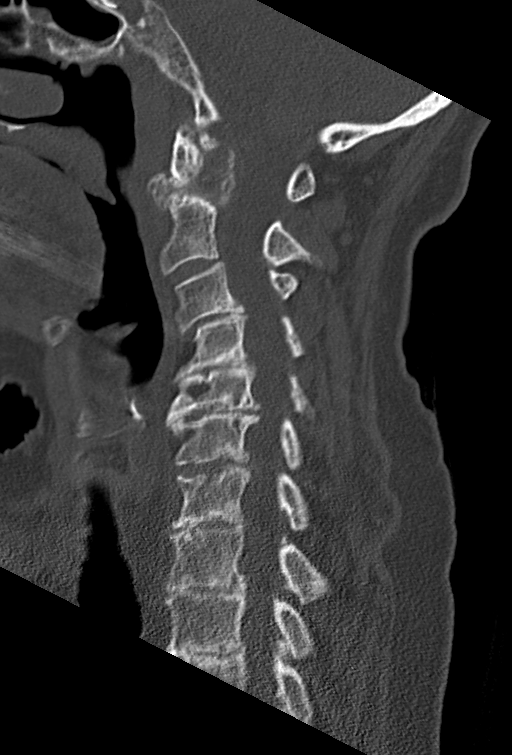
[im 26/51  soft-tissue]
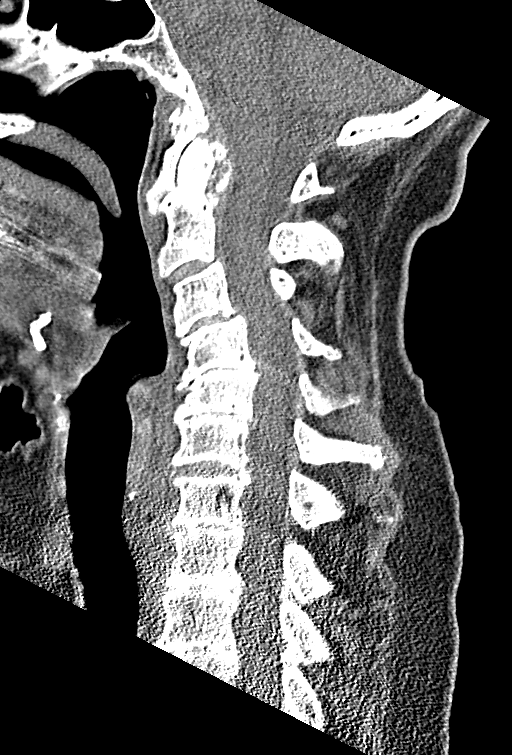
[im 26/51  bone]
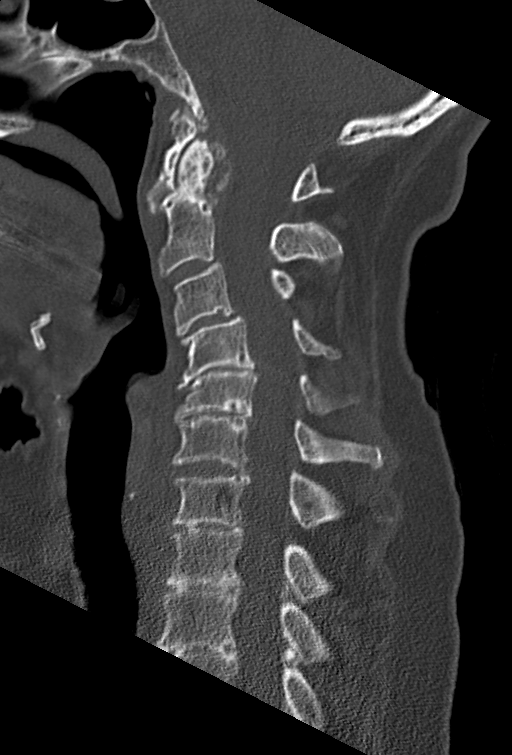
[im 30/51  bone]
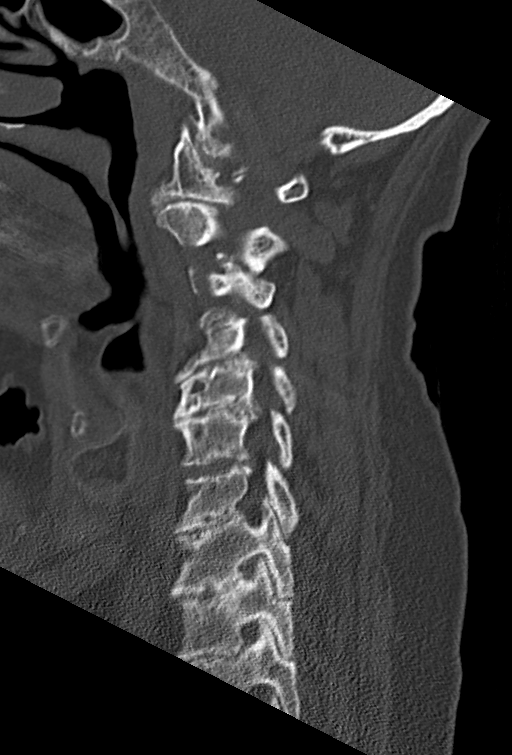
[im 34/51  bone]
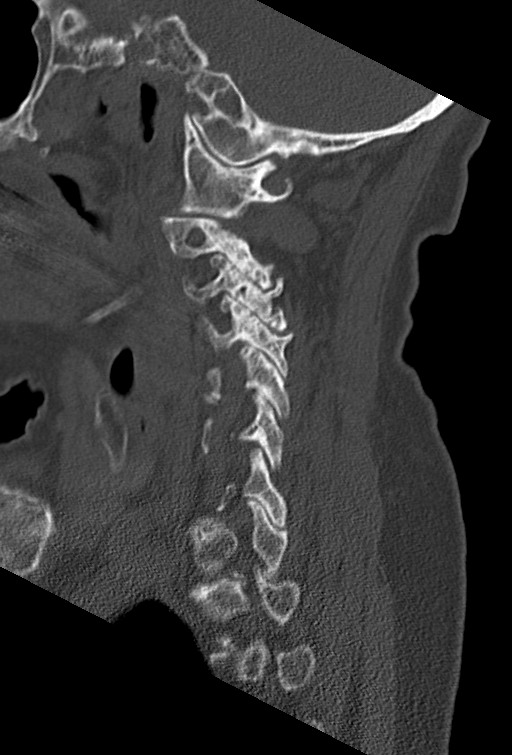

[Series 7: coronal bone · coronal · 0.24mm/px · 3 of 61 slices shown]
[im 15/61  bone]
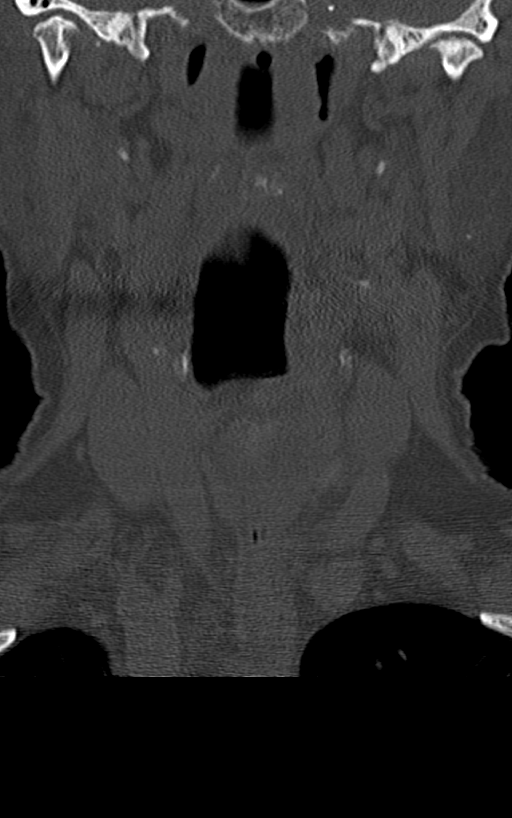
[im 25/61  bone]
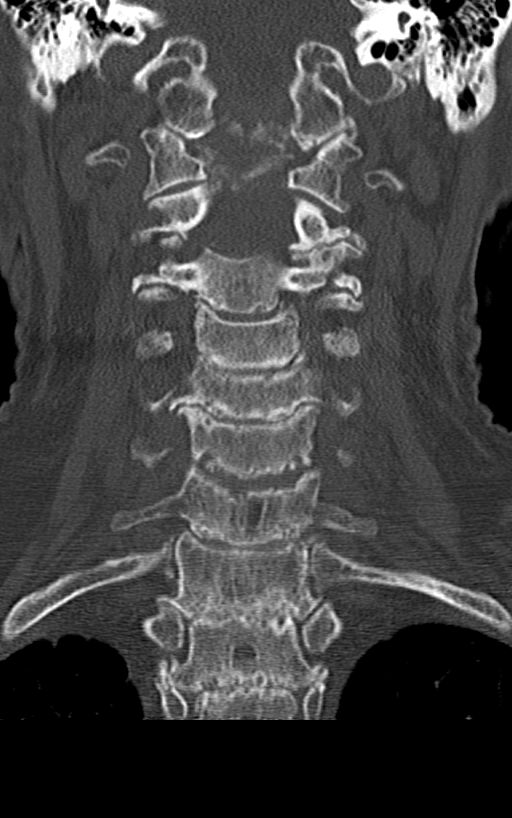
[im 36/61  bone]
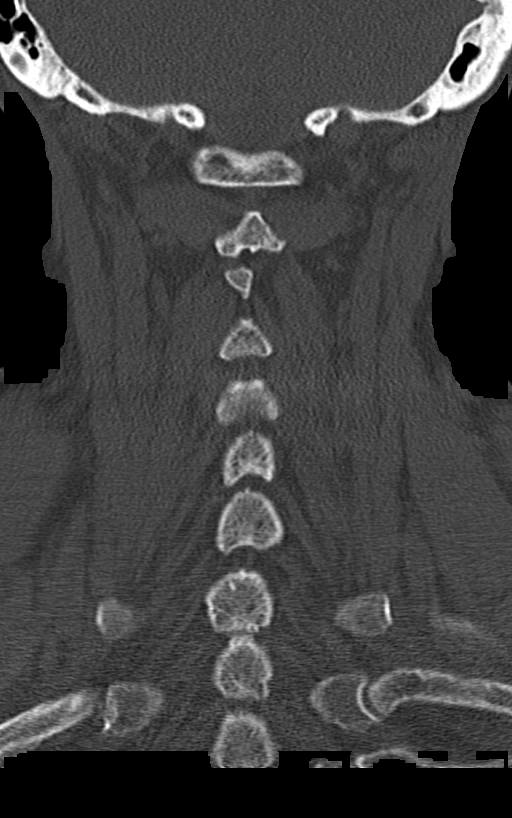

[Series 8: orthogonal bone · axial · 0.23mm/px · z∈[-235,-108]mm · 4 of 101 slices shown, 5 images]
[im 15/101  soft-tissue]
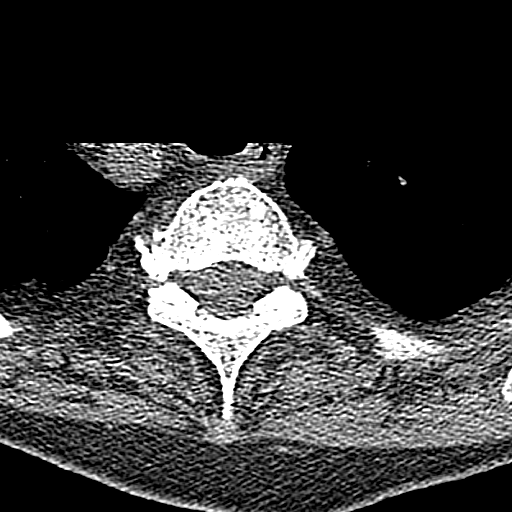
[im 15/101  bone]
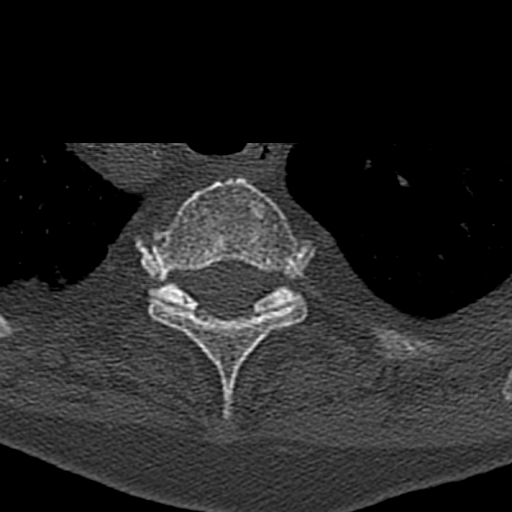
[im 43/101  bone]
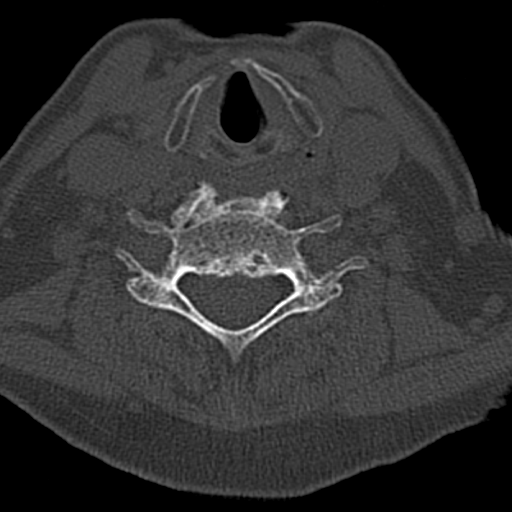
[im 58/101  bone]
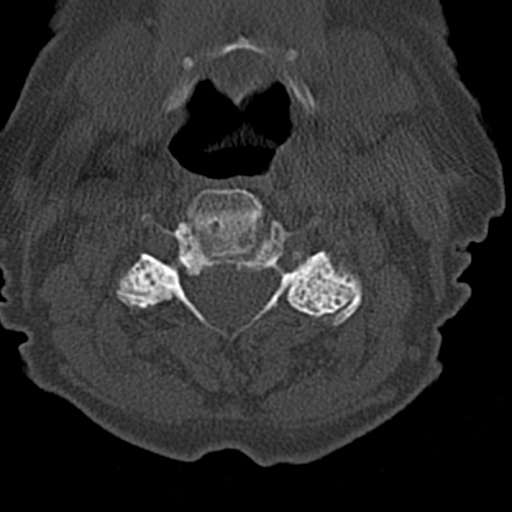
[im 86/101  bone]
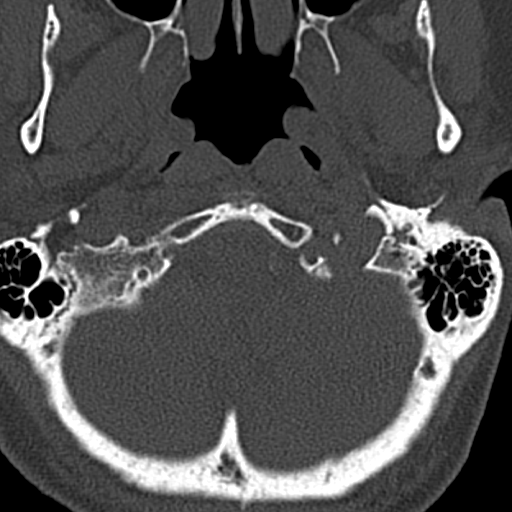

[12 of 33 positions shown; findings below may reference images not displayed]

FINDINGS: Alignment: Straightening and mild kyphotic curvature in the cervical
region.

Skull base and vertebrae: No regional fracture or focal bone lesion.

Soft tissues and spinal canal: No traumatic soft tissue finding.

Disc levels: Foramen magnum is widely patent. There is chronic
arthropathy of the C1-2 articulation without compressive
encroachment upon the spinal canal.

C2-3: Facet osteoarthritis left worse than right. Mild bony
foraminal narrowing on the left.

C3-4: Endplate osteophytes. Bilateral facet osteoarthritis worse on
the left. Bilateral bony foraminal stenosis.

C4-5: Endplate osteophytes. Mild facet degeneration. Bony foraminal
stenosis on the left.

C5-6: Endplate osteophytes.  Bilateral bony foraminal stenosis.

C6-7: Endplate osteophytes and mild bulging of the disc. No likely
compressive stenosis.

C7-T1: Mild uncovertebral prominence.  No compressive stenosis.

Upper chest: Negative

Other: None
IMPRESSION: No acute or traumatic finding. Chronic degenerative changes
throughout the cervical region as outlined above. No likely
compressive central canal stenosis. Multiple foraminal stenoses as
listed.

## 2021-08-06 IMAGING — CT CT HEAD W/O CM
3 series · 16 of 46 positions shown, 19 images · non-contrast
Comparison: CT [DATE], MRI head [DATE].

CLINICAL DATA: Parenchymal hemorrhage follow-up

EXAM:
CT HEAD WITHOUT CONTRAST
TECHNIQUE: Contiguous axial images were obtained from the base of the skull
through the vertex without intravenous contrast.

[Series 3: coronal soft tissue · coronal · 0.31mm/px · 3 of 65 slices shown]
[im 22/65  brain]
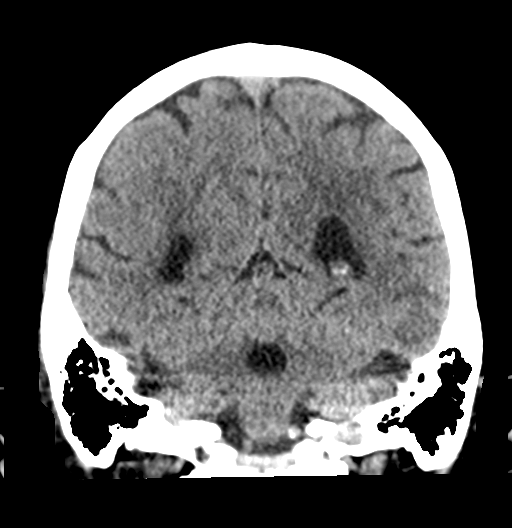
[im 29/65  brain]
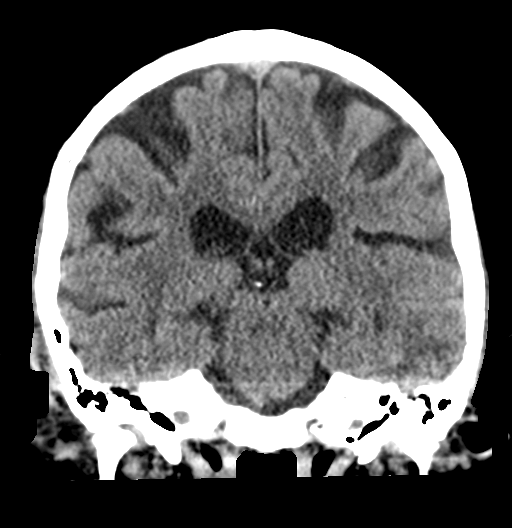
[im 36/65  brain]
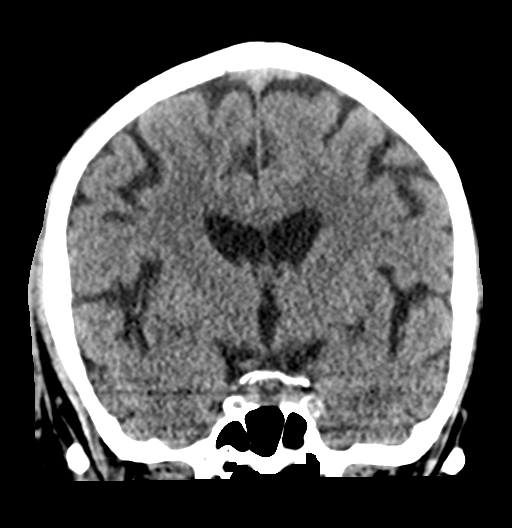

[Series 4: sagittal soft tissue · sagittal · 0.32mm/px · 3 of 53 slices shown]
[im 18/53  brain]
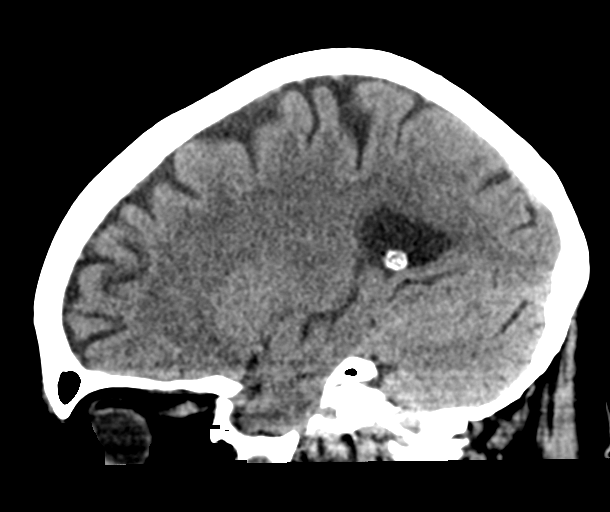
[im 27/53  brain]
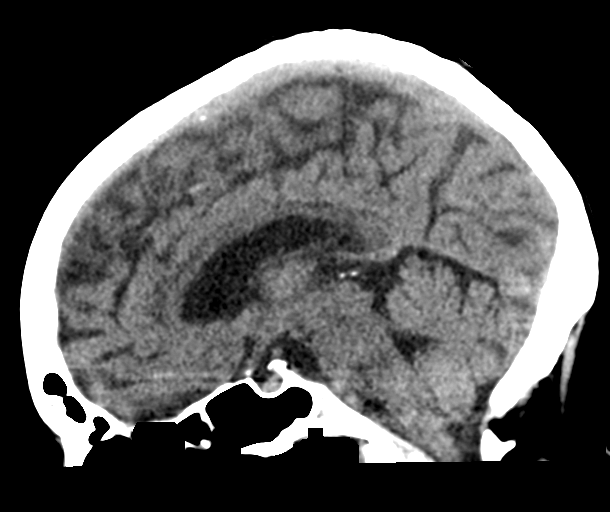
[im 35/53  brain]
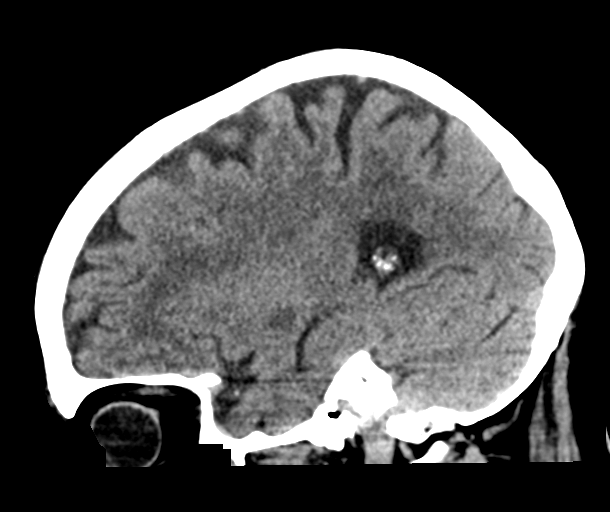

[Series 5: head wo · axial · 0.40mm/px · z∈[-122,-2]mm · 10 of 29 slices shown, 13 images]
[im 3/29  brain]
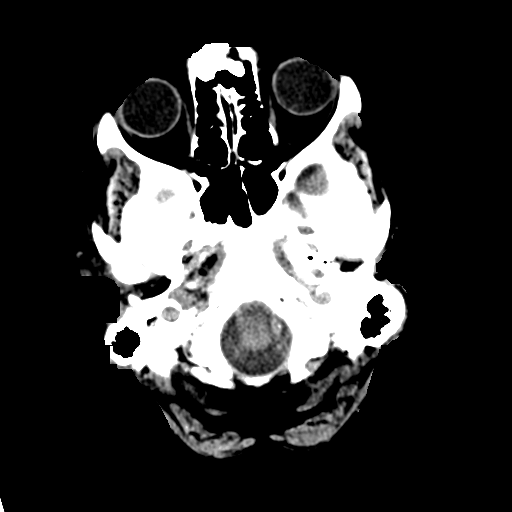
[im 3/29  bone]
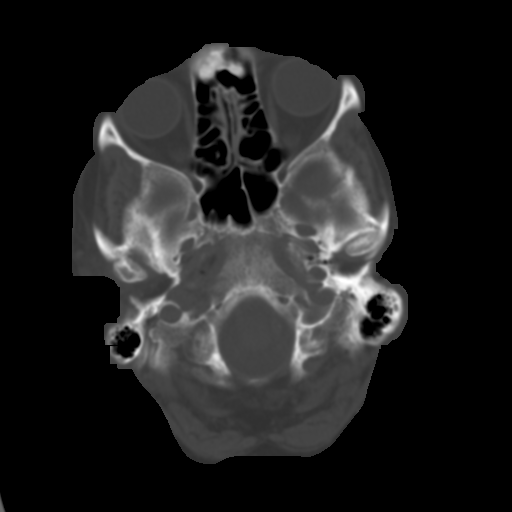
[im 6/29  brain]
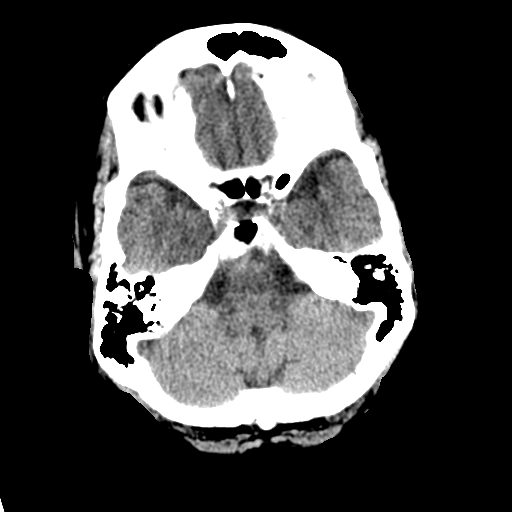
[im 8/29  brain]
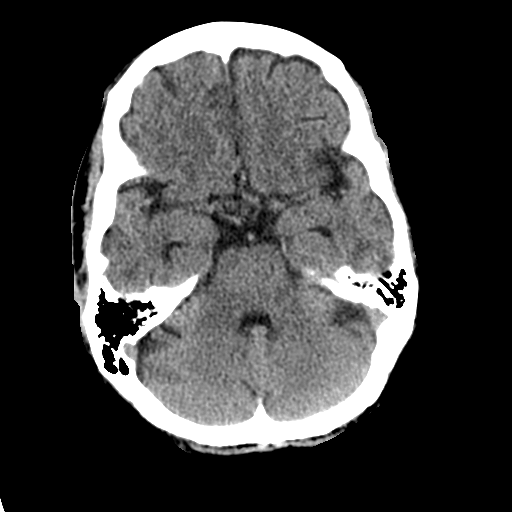
[im 11/29  brain]
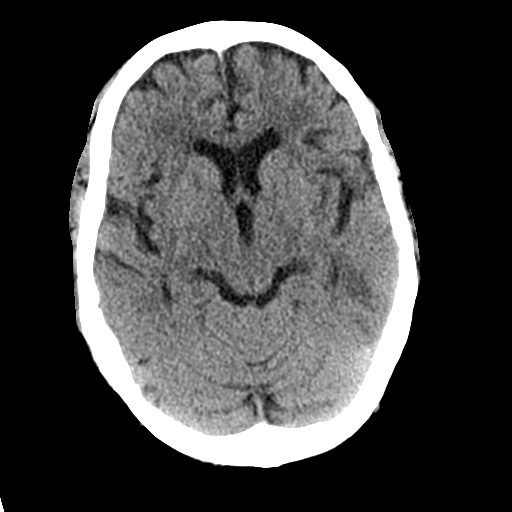
[im 14/29  brain]
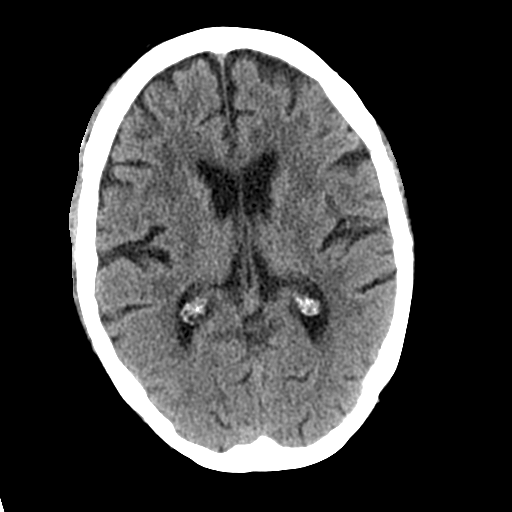
[im 14/29  bone]
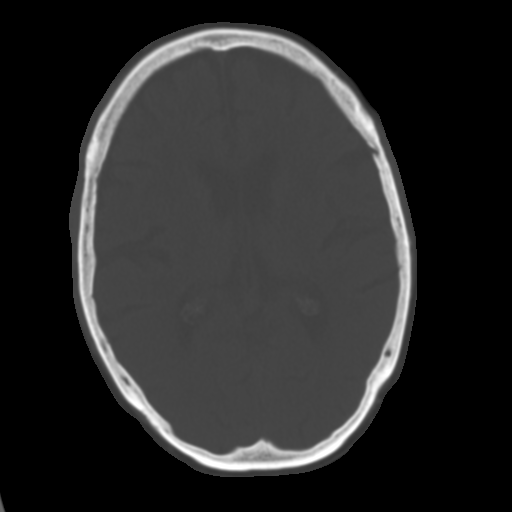
[im 16/29  brain]
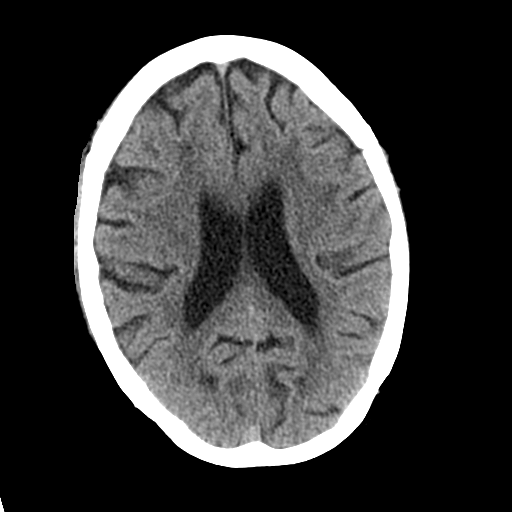
[im 19/29  brain]
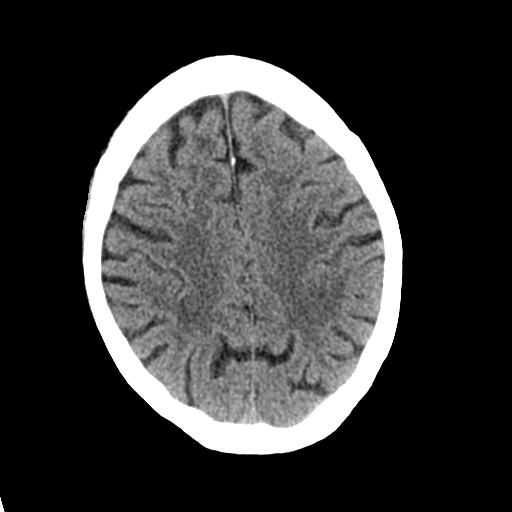
[im 22/29  brain]
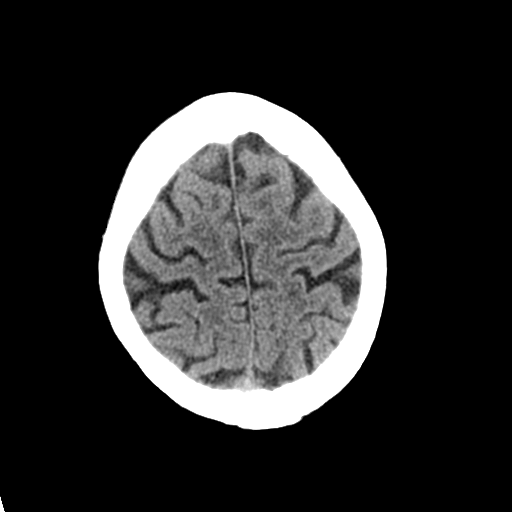
[im 24/29  brain]
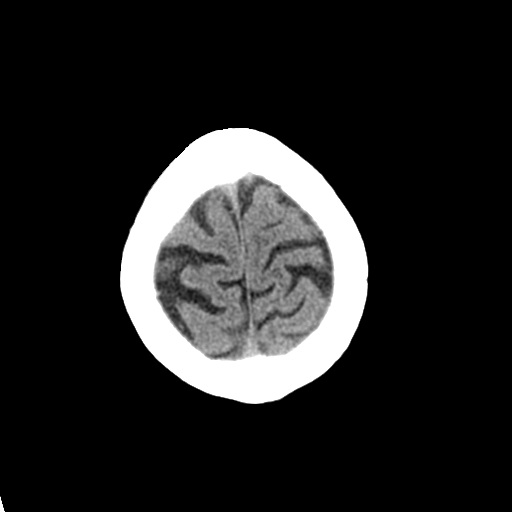
[im 24/29  bone]
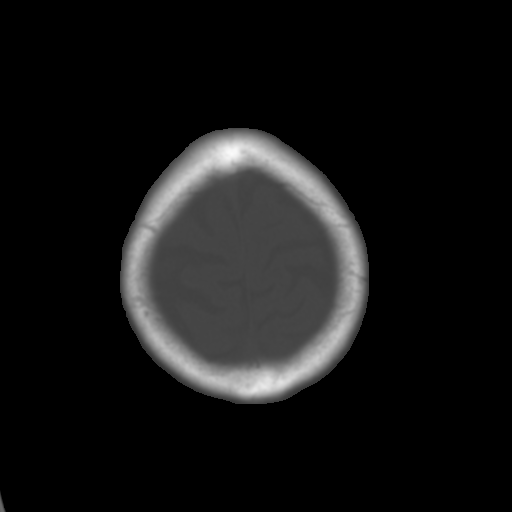
[im 27/29  brain]
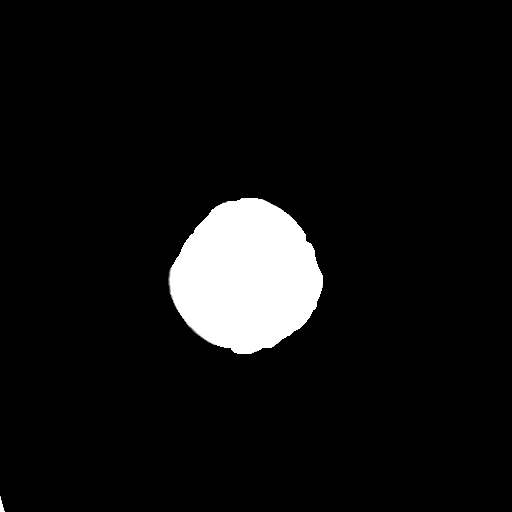

[16 of 46 positions shown; findings below may reference images not displayed]

FINDINGS: Brain: Redemonstrated area of hypoattenuation in the inferior left
temporal lobe (series 5, image 9), with a small area of hyperdensity
(series 5, image 8), unchanged from the prior exam, and consistent
with contusion with small amount of hemorrhage. Previously noted
trace left occipital subdural hematoma is not appreciated on CT.

No new area of hemorrhage, acute infarct, mass, mass effect, or
midline shift. No hydrocephalus. Periventricular white matter
changes, likely the sequela of chronic small vessel ischemic
disease.

Vascular: No hyperdense vessel. Atherosclerotic calcifications in
the intracranial carotid and vertebral arteries.

Skull: No acute fracture

Sinuses/Orbits: No acute finding. Status post bilateral lens
replacements.

Other: Unchanged right frontotemporal scalp hematoma.
IMPRESSION: 1. Unchanged area of hypodensity in left temporal lobe, consistent
with contusion, with unchanged small area of hemorrhage at the
inferior aspect, as seen on the prior CT and MRI. No new area of
hemorrhage.
2. The trace left occipital subdural hemorrhage seen on prior MRI is
not appreciated on CT.

## 2021-08-06 MED ORDER — ACETAMINOPHEN 650 MG RE SUPP: 650.0000 mg | Freq: Four times a day (QID) | RECTAL | Status: DC | PRN

## 2021-08-06 MED ORDER — LABETALOL HCL 5 MG/ML IV SOLN: 5.0000 mg | INTRAVENOUS | Status: AC | PRN

## 2021-08-06 MED ORDER — LEVETIRACETAM 500 MG PO TABS
500.0000 mg | ORAL_TABLET | Freq: Two times a day (BID) | ORAL | Status: DC
  Administered 2021-08-06 – 2021-08-09 (×6): 500 mg via ORAL
  Filled 2021-08-06 (×8): qty 1

## 2021-08-06 MED ORDER — GADOBUTROL 1 MMOL/ML IV SOLN
6.0000 mL | Freq: Once | INTRAVENOUS | Status: AC | PRN
  Administered 2021-08-06: 6 mL via INTRAVENOUS
  Filled 2021-08-06: qty 6

## 2021-08-06 MED ORDER — AMLODIPINE BESYLATE 10 MG PO TABS
10.0000 mg | ORAL_TABLET | Freq: Every day | ORAL | Status: DC
  Administered 2021-08-06 – 2021-08-09 (×4): 10 mg via ORAL
  Filled 2021-08-06: qty 2
  Filled 2021-08-06: qty 1
  Filled 2021-08-06 (×3): qty 2

## 2021-08-06 MED ORDER — LEVETIRACETAM 500 MG PO TABS
500.0000 mg | ORAL_TABLET | Freq: Once | ORAL | Status: AC
  Administered 2021-08-06: 500 mg via ORAL
  Filled 2021-08-06: qty 1

## 2021-08-06 MED ORDER — ACETAMINOPHEN 325 MG PO TABS
650.0000 mg | ORAL_TABLET | Freq: Four times a day (QID) | ORAL | Status: DC | PRN
  Administered 2021-08-06 – 2021-08-09 (×7): 650 mg via ORAL
  Filled 2021-08-06 (×7): qty 2

## 2021-08-06 MED ORDER — ONDANSETRON HCL 4 MG PO TABS
4.0000 mg | ORAL_TABLET | Freq: Four times a day (QID) | ORAL | Status: DC | PRN
Start: 2021-08-06 — End: 2021-08-09
  Administered 2021-08-07: 4 mg via ORAL
  Filled 2021-08-06: qty 1

## 2021-08-06 MED ORDER — ONDANSETRON HCL 4 MG/2ML IJ SOLN
4.0000 mg | Freq: Four times a day (QID) | INTRAMUSCULAR | Status: DC | PRN
Start: 2021-08-06 — End: 2021-08-09
  Administered 2021-08-07: 4 mg via INTRAVENOUS

## 2021-08-06 MED ORDER — SODIUM CHLORIDE 0.9 % IV SOLN
12.5000 mg | Freq: Four times a day (QID) | INTRAVENOUS | Status: DC | PRN
  Filled 2021-08-06: qty 0.5

## 2021-08-06 MED ORDER — ONDANSETRON HCL 4 MG/2ML IJ SOLN
4.0000 mg | Freq: Once | INTRAMUSCULAR | Status: AC
  Administered 2021-08-06: 4 mg via INTRAVENOUS

## 2021-08-06 NOTE — ED Provider Notes (Signed)
[facility] Emergency Department Provider Note   ____________________________________________   Event Date/Time   First MD Initiated Contact with Patient 08/06/21 1149     (approximate)  I have reviewed the triage vital signs and the nursing notes.   HISTORY  Chief Complaint Fall    HPI Jane Willis is a 80 y.o. female with past medical history of hypertension and hyperlipidemia who presents to the ED complaining of fall.  Patient reports that yesterday around 11:30 she was going down the steps and carrying of food when she lost her balance and fell backwards.  She does not remember the exact context of the fall, states she is unsure whether she hit her head or loss consciousness.  She does state that she felt nauseous prior to the fall and had vomited a couple of times yesterday, continues to feel nauseous today.  She has had 1 episode of diarrhea but denies any abdominal pain, fever, dysuria, or flank pain.  Her son went to check on her today, found her somewhat disoriented when she is typically very sharp.  He brought her to the ER for further evaluation.  Since arriving to the ED, patient has also noticed area of blistering to her left wrist.  She is not sure whether the hot food in the crockpot had gotten on her wrist.        Past Medical History:  Diagnosis Date   Allergy    Seasonal   Arthritis    Chicken pox    Hyperlipidemia    Hypertension    Urinary incontinence     Patient Active Problem List   Diagnosis Date Noted   History of COVID-19 03/01/2020   Prediabetes 07/06/2018   Age-related osteoporosis without current pathological fracture 12/29/2017   B12 deficiency 12/29/2017   Subcutaneous nodule 06/29/2017   Allergic rhinitis 03/19/2017   Family history of colon cancer 02/11/2017   Hypertension 12/31/2016   Hyperlipidemia 07/03/2016   History of low impact fracture of ankle 07/03/2016   Bilateral finger numbness  04/02/2016    Past Surgical History:  Procedure Laterality Date   ABDOMINAL HYSTERECTOMY  1986   BREAST EXCISIONAL BIOPSY Left 1970's   benign   BREAST SURGERY  1968   CATARACT EXTRACTION  2014/2015   Both eyes   FRACTURE SURGERY  01/2009   Fractured Left ankle, plate on outside of ankle 2 rods through/across ankle from inside of ankle   [place]      Prior to Admission medications   Medication Sig Start Date End Date Taking? Authorizing Provider  amLODipine (NORVASC) 10 MG tablet TAKE 1 TABLET(10 MG) BY MOUTH DAILY 07/01/21   Leone Haven, MD  aspirin 81 MG tablet Take 81 mg by mouth daily. Reported on 02/21/2016    [provider]  Cholecalciferol (VITAMIN D-3) 1000 UNITS CAPS Take 1,000 capsules by mouth 2 (two) times daily.    [provider]  Omega-3 Fatty Acids (FISH OIL) 1360 MG CAPS Take by mouth.    [provider]  vitamin B-12 (CYANOCOBALAMIN) 1000 MCG tablet  10/23/19   [provider]  vitamin C (ASCORBIC ACID) 500 MG tablet Take 500 mg by mouth daily.    [provider]    Allergies Patient has no known allergies.  Family History  Problem Relation Age of Onset   Arthritis Mother    Hypertension Mother    Arthritis Father    Hypertension  Father    Arthritis Maternal Grandmother    Cancer Brother        Colon   Cancer Maternal Aunt        Breast Cancer   Breast cancer Maternal Aunt    Breast cancer Cousin    Breast cancer Cousin    Hyperlipidemia Son    Hyperlipidemia Son     Social History Social History   Tobacco Use   Smoking status: Never   Smokeless tobacco: Never  Vaping Use   Vaping Use: Never used  Substance Use Topics   Alcohol use: No    Alcohol/week: 0.0 standard drinks   Drug use: No    Review of Systems  Constitutional: No fever/chills.  Positive for confusion. Eyes: No visual changes. ENT: No sore throat. Cardiovascular: Denies chest  pain. Respiratory: Denies shortness of breath. Gastrointestinal: No abdominal pain.  No nausea, no vomiting.  No diarrhea.  No constipation. Genitourinary: Negative for dysuria. Musculoskeletal: Negative for back pain. Skin: Positive for rash. Neurological: Negative for headaches, focal weakness or numbness.  ____________________________________________   PHYSICAL EXAM:  VITAL SIGNS: ED Triage Vitals [08/06/21 1051]  Enc Vitals Group     BP (!) 181/95     Pulse Rate 70     Resp 18     Temp 98.2 F (36.8 C)     Temp Source Oral     SpO2 99 %     Weight 138 lb (62.6 kg)     Height 5\' 3"  (1.6 m)     Head Circumference      Peak Flow      Pain Score 2     Pain Loc      Pain Edu?      Excl. in [place]?     Constitutional: Alert and oriented. Eyes: Conjunctivae are normal. Head: Atraumatic. Nose: No congestion/rhinnorhea. Mouth/Throat: Mucous membranes are moist. Neck: Normal ROM, no midline cervical spine tenderness to palpation. Cardiovascular: Normal rate, regular rhythm. Grossly normal heart sounds.  2+ radial pulses bilaterally. Respiratory: Normal respiratory effort.  No retractions. Lungs CTAB. Gastrointestinal: Soft and nontender. No distention. Genitourinary: deferred Musculoskeletal: No lower extremity tenderness nor edema.  No upper extremity bony tenderness to palpation, full range of motion intact to all extremities without pain. Neurologic:  Normal speech and language. No gross focal neurologic deficits are appreciated. Skin: Area of blistering and erythema noted to radial portion of left wrist, small area of blistering at first MCP joint. Psychiatric: Mood and affect are normal. Speech and behavior are normal.  ____________________________________________   LABS (all labs ordered are listed, but only abnormal results are displayed)  Labs Reviewed  COMPREHENSIVE METABOLIC PANEL - Abnormal; Notable for the following components:      Result Value   Potassium  3.4 (*)    Glucose, Bld 118 (*)    All other components within normal limits  RESP PANEL BY RT-PCR (FLU A&B, COVID) ARPGX2  CBC  LIPASE, BLOOD  URINALYSIS, COMPLETE (UACMP) WITH MICROSCOPIC   ____________________________________________  EKG  ED ECG REPORT I, Blake Divine, the attending physician, personally viewed and interpreted this ECG.   Date: 08/06/2021  EKG Time: 10:59  Rate: 67  Rhythm: normal sinus rhythm  Axis: Normal  Intervals:none  ST&T Change: None   PROCEDURES  Procedure(s) performed (including Critical Care):  Procedures   ____________________________________________   INITIAL IMPRESSION / ASSESSMENT AND PLAN / ED COURSE      80 year old female with past medical history of hypertension and  hyperlipidemia presents to the ED with dorsal orientation after fall of unclear context yesterday around 11:30 AM.  Patient has no focal neurologic deficits on exam, but has a hard time remembering details of yesterday.  EKG shows no evidence of arrhythmia or ischemia and labs are thus far unremarkable.  CT head shows age-indeterminate area of contusion versus stroke, we will further discuss with neurology and plan for MRI brain as this could be an explanation for her symptoms.  She is outside the window for intervention and I do not suspect large vessel occlusion.  We will also check CT cervical spine given trauma with change in mental status.  She has area of blistering to her left wrist that appears consistent with allergic reaction versus burn, states it is not painful and has no pain with range of motion of her wrist.  We will observe here in the ED for any change in skin lesion while obtaining MRI.  MRI appears consistent with hemorrhagic contusion with small subdural hematoma representing contrecoup injury.  Findings were discussed with Dr. Cari Caraway of neurosurgery, who recommends repeat imaging in 6 hours to ensure stability.  He also recommends starting patient  on 500 mg of Keppra twice daily for 1 week.  Given patient's ongoing mild confusion with nausea and vomiting, we will have her admitted to the hospital for observation.  She will be seen by neurosurgery while here in the hospital.  Case discussed with hospitalist for admission.      ____________________________________________   FINAL CLINICAL IMPRESSION(S) / ED DIAGNOSES  Final diagnoses:  Fall, initial encounter  Focal hemorrhagic contusion of cerebrum  Confusion  Nausea and vomiting, unspecified vomiting type     ED Discharge Orders     None        Note:  This document was prepared using Dragon voice recognition software and may include unintentional dictation errors.    Blake Divine, MD 08/06/21 1736

## 2021-08-06 NOTE — Telephone Encounter (Signed)
Patient informed, Due to the high volume of calls and your symptoms we have to forward your call to our Triage Nurse to expedient your call. Please hold for the transfer.  Patient and patients son transferred to Shanon Brow at [facility]. Due to falling yesterday and not feeling well overall as well as vomiting and vision trouble.She is unsure what to do next and would like to speak with a nurse.

## 2021-08-06 NOTE — Telephone Encounter (Signed)
Noted. Patient is currently in the ED.

## 2021-08-06 NOTE — ED Triage Notes (Signed)
Pt to ED for fall yesterday, tripped down steps while carrying plates. Fell down 2 steps. Denies hitting head however c/o pain to left side of head, denies blood thinner use.  +nausea Son reports confusion since fall.

## 2021-08-06 NOTE — Telephone Encounter (Signed)
Patient was instructed by access nurse to go to ED. Patient is currently in ED per chart.

## 2021-08-06 NOTE — H&P (Signed)
History and Physical   Jane Willis NUU:[id number] DOB: 1940/10/20 DOA: 08/06/2021  PCP: Leone Haven, MD  Outpatient Specialists: Dr. Tamala Julian, orthopedic surgery Patient coming from: Home  I have personally briefly reviewed patient's old medical records in [place].  Chief Concern: Fall  HPI: Jane Willis is a 80 y.o. female with medical history significant for hypertension, who presents emergency department from home for chief concerns of a fall and confusion.  Patient was on her way to a funeral.  She had made meatballs and a crockpot.  She was carrying a hot crockpot to the garage and as she was stepping down she tripped and fell.  The crockpot shattered.  She endorses head trauma however does not know where.  She denies loss of consciousness and or syncope.  She endorses following the event she vomited several times does not know how many times she vomited.  On day of presentation she endorses dry gagging but no vomiting.  She reports this is never happened before.  She reports that otherwise she performs her own ADLs and has been very lucky with excellent health.  She reports that there was a couple of episodes of blood mixed with mucus in her vomitus.  She also endorses 3 episodes of diarrhea after the fall 08/05/2021.  She denies bright red blood.  She endorses the diarrhea is dark brown.  Social history: Patient lives at home by herself and performs her own ADLs.  ROS: Constitutional: no weight change, no fever ENT/Mouth: no sore throat, no rhinorrhea Eyes: no eye pain, no vision changes Cardiovascular: no chest pain, no dyspnea,  no edema, no palpitations Respiratory: no cough, no sputum, no wheezing Gastrointestinal: + nausea, + vomiting, + diarrhea, no constipation Genitourinary: no urinary incontinence, no dysuria, no hematuria Musculoskeletal: no arthralgias, no myalgias Skin: no skin lesions, no pruritus, +  Neuro: + weakness, no loss of  consciousness, no syncope Psych: no anxiety, no depression, + decrease appetite Heme/Lymph: no bruising, no bleeding  ED Course: Discussed with ED provider, patient requiring hospitalization for chief concerns of subdural hematoma.  Vitals in the emergency department was remarkable for temperature of 98.1, respiration rate of 17, heart rate of 87, initial blood pressure 170/74, SPO2 of 98% on room air.  Labs in the emergency department was remarkable for sodium 135, potassium 3.4, chloride 101, bicarb 25, BUN of 15, serum creatinine of 0.84, nonfasting blood glucose 118, WBC 6.9, hemoglobin of 14, platelets of 317.  Patient was given Keppra 500 mg p.o., Zofran 4 mg IV.  EDP discussed with Dr. Cari Caraway, who states he will see the patient.  Assessment/Plan  Principal Problem:   Subdural hematoma Active Problems:   Hyperlipidemia   Hypertension   Family history of colon cancer   B12 deficiency   History of COVID-19   # Subdural hematoma-secondary to mechanical fall - Neurosurgery, Dr. Izora Ribas has been consulted who states he will see the patient - Per EDP, Dr. Cari Caraway recommended initiation of Keppra 500 mg p.o. twice daily, for 1 week - Repeat CT head without contrast imaging in 6 hours, CT of the head scheduled for 1935 has been ordered - Keppra 500 mg p.o. twice daily, 7 days ordered  # Hypertension-resumed home amlodipine 10 mg daily - Labetalol 5 mg IV every 2 hours as needed for SBP greater than 160  # As needed medications including Phenergan for refractory nausea and vomiting  # Ecchymosis of the right frontal/temporal lobe-present on admission  #  Left hand wound-present on admission  # CODE STATUS: Patient states that she does not wish to be resuscitated or intubated.  Patient states that she has discussed with her son who is healthcare power of attorney.  # DVT prophylaxis-pharmacologic DVT prophylaxis has not been initiated by myself due to subdural  hematoma  Chart reviewed.   DVT prophylaxis: TED hose Code Status: DNR/DNI Diet: N.p.o. except for sips with meds and ice chips Family Communication: Updated son/healthcare power of attorney, Mr. Siara Gorder over the phone Disposition Plan: Pending clinical course Consults called: Neurosurgery Admission status: Observation, progressive cardiac, telemetry  Past Medical History:  Diagnosis Date   Allergy    Seasonal   Arthritis    Chicken pox    Hyperlipidemia    Hypertension    Urinary incontinence    Past Surgical History:  Procedure Laterality Date   ABDOMINAL HYSTERECTOMY  1986   BREAST EXCISIONAL BIOPSY Left 1970's   benign   BREAST SURGERY  1968   CATARACT EXTRACTION  2014/2015   Both eyes   FRACTURE SURGERY  01/2009   Fractured Left ankle, plate on outside of ankle 2 rods through/across ankle from inside of ankle   OOPHORECTOMY     TONSILLECTOMY AND ADENOIDECTOMY     Social History:  reports that she has never smoked. She has never used smokeless tobacco. She reports that she does not drink alcohol and does not use drugs.  No Known Allergies Family History  Problem Relation Age of Onset   Arthritis Mother    Hypertension Mother    Arthritis Father    Hypertension Father    Arthritis Maternal Grandmother    Cancer Brother        Colon   Cancer Maternal Aunt        Breast Cancer   Breast cancer Maternal Aunt    Breast cancer Cousin    Breast cancer Cousin    Hyperlipidemia Son    Hyperlipidemia Son    Family history: Family history reviewed and not pertinent  Prior to Admission medications   Medication Sig Start Date End Date Taking? Authorizing Provider  amLODipine (NORVASC) 10 MG tablet TAKE 1 TABLET(10 MG) BY MOUTH DAILY 07/01/21   Leone Haven, MD  aspirin 81 MG tablet Take 81 mg by mouth daily. Reported on 02/21/2016    [provider]  Cholecalciferol (VITAMIN D-3) 1000 UNITS CAPS Take 1,000 capsules by mouth 2 (two) times daily.     [provider]  Omega-3 Fatty Acids (FISH OIL) 1360 MG CAPS Take by mouth.    [provider]  vitamin B-12 (CYANOCOBALAMIN) 1000 MCG tablet  10/23/19   [provider]  vitamin C (ASCORBIC ACID) 500 MG tablet Take 500 mg by mouth daily.    [provider]   Physical Exam: Vitals:   08/06/21 1051 08/06/21 1545  BP: (!) 181/95 (!) 170/74  Pulse: 70 87  Resp: 18 17  Temp: 98.2 F (36.8 C) 98.1 F (36.7 C)  TempSrc: Oral Oral  SpO2: 99% 98%  Weight: 62.6 kg   Height: 5\' 3"  (1.6 m)    Constitutional: appears age-appropriate, frail, NAD, calm, comfortable Eyes: PERRL, lids and conjunctivae normal ENMT: Mucous membranes are moist. Posterior pharynx clear of any exudate or lesions. Age-appropriate dentition. Hearing appropriate Neck: normal, supple, no masses, no thyromegaly Respiratory: clear to auscultation bilaterally, no wheezing, no crackles. Normal respiratory effort. No accessory muscle use.  Cardiovascular: Regular rate and rhythm, no murmurs / rubs /  gallops. No extremity edema. 2+ pedal pulses. No carotid bruits.  Abdomen: no tenderness, no masses palpated, no hepatosplenomegaly. Bowel sounds positive.  Musculoskeletal: no clubbing / cyanosis. No joint deformity upper and lower extremities. Good ROM, no contractures, no atrophy. Normal muscle tone.  Skin: no rashes, lesions, ulcers. No induration.  Ecchymosis at the right frontal temporal lobe is present Neurologic: Sensation intact. Strength 5/5 in all 4.  Psychiatric: Normal judgment and insight. Alert and oriented x 3. Normal mood.   EKG: independently reviewed, showing sinus rhythm with rate of 67, QTc 424  Chest x-ray on Admission: I personally reviewed and I agree with radiologist reading as below.  CT HEAD WO CONTRAST (5MM)  Result Date: 08/06/2021 CLINICAL DATA:  Parenchymal hemorrhage follow-up EXAM: CT HEAD WITHOUT CONTRAST TECHNIQUE: Contiguous axial images were obtained from the  base of the skull through the vertex without intravenous contrast. COMPARISON:  CT 08/06/2021, MRI head 08/06/2021. FINDINGS: Brain: Redemonstrated area of hypoattenuation in the inferior left temporal lobe (series 5, image 9), with a small area of hyperdensity (series 5, image 8), unchanged from the prior exam, and consistent with contusion with small amount of hemorrhage. Previously noted trace left occipital subdural hematoma is not appreciated on CT. No new area of hemorrhage, acute infarct, mass, mass effect, or midline shift. No hydrocephalus. Periventricular white matter changes, likely the sequela of chronic small vessel ischemic disease. Vascular: No hyperdense vessel. Atherosclerotic calcifications in the intracranial carotid and vertebral arteries. Skull: No acute fracture Sinuses/Orbits: No acute finding. Status post bilateral lens replacements. Other: Unchanged right frontotemporal scalp hematoma. IMPRESSION: 1. Unchanged area of hypodensity in left temporal lobe, consistent with contusion, with unchanged small area of hemorrhage at the inferior aspect, as seen on the prior CT and MRI. No new area of hemorrhage. 2. The trace left occipital subdural hemorrhage seen on prior MRI is not appreciated on CT. Electronically Signed   By: Merilyn Baba M.D.   On: 08/06/2021 20:27   CT HEAD WO CONTRAST (5MM)  Result Date: 08/06/2021 CLINICAL DATA:  Mental status change, unknown cause; technologist note states recent fall EXAM: CT HEAD WITHOUT CONTRAST TECHNIQUE: Contiguous axial images were obtained from the base of the skull through the vertex without intravenous contrast. COMPARISON:  None. FINDINGS: Brain: There is no acute intracranial hemorrhage or mass effect. Patchy and confluent hypoattenuation in the supratentorial white matter is nonspecific but may reflect mild to moderate chronic microvascular ischemic changes. There is a small wedge-shaped area of low-density and loss of gray-white  differentiation involving the inferior left temporal lobe without apparent volume loss. There is no extra-axial fluid collection. Prominence of the ventricles and sulci reflects minor parenchymal volume loss. Vascular: No hyperdense vessel.There is atherosclerotic calcification at the skull base. Skull: Calvarium is unremarkable. Sinuses/Orbits: No acute finding. Other: Right frontotemporal scalp soft tissue swelling/hematoma. IMPRESSION: No acute intracranial hemorrhage. Age-indeterminate infarct or nonhemorrhagic contusion of the inferior left temporal lobe. Chronic microvascular ischemic changes. Electronically Signed   By: Macy Mis M.D.   On: 08/06/2021 11:46   CT Cervical Spine Wo Contrast  Result Date: 08/06/2021 CLINICAL DATA:  Neck trauma.  Fell yesterday, falling down steps EXAM: CT CERVICAL SPINE WITHOUT CONTRAST TECHNIQUE: Multidetector CT imaging of the cervical spine was performed without intravenous contrast. Multiplanar CT image reconstructions were also generated. COMPARISON:  None. FINDINGS: Alignment: Straightening and mild kyphotic curvature in the cervical region. Skull base and vertebrae: No regional fracture or focal bone lesion. Soft tissues and spinal canal: No  traumatic soft tissue finding. Disc levels: Foramen magnum is widely patent. There is chronic arthropathy of the C1-2 articulation without compressive encroachment upon the spinal canal. C2-3: Facet osteoarthritis left worse than right. Mild bony foraminal narrowing on the left. C3-4: Endplate osteophytes. Bilateral facet osteoarthritis worse on the left. Bilateral bony foraminal stenosis. C4-5: Endplate osteophytes. Mild facet degeneration. Bony foraminal stenosis on the left. C5-6: Endplate osteophytes.  Bilateral bony foraminal stenosis. C6-7: Endplate osteophytes and mild bulging of the disc. No likely compressive stenosis. C7-T1: Mild uncovertebral prominence.  No compressive stenosis. Upper chest: Negative Other: None  IMPRESSION: No acute or traumatic finding. Chronic degenerative changes throughout the cervical region as outlined above. No likely compressive central canal stenosis. Multiple foraminal stenoses as listed. Electronically Signed   By: Nelson Chimes M.D.   On: 08/06/2021 14:22   MR BRAIN W WO CONTRAST  Result Date: 08/06/2021 CLINICAL DATA:  Fall.  Neuro deficit.  Rule out stroke. EXAM: MRI HEAD WITHOUT AND WITH CONTRAST TECHNIQUE: Multiplanar, multiecho pulse sequences of the brain and surrounding structures were obtained without and with intravenous contrast. CONTRAST:  1mL GADAVIST GADOBUTROL 1 MMOL/ML IV SOLN COMPARISON:  CT head 08/06/2021 FINDINGS: Brain: Mild generalized atrophy.  Negative for hydrocephalus. T2 and FLAIR hyperintensity in the left posterolateral temporal lobe corresponding to a low-density area on CT earlier today. Small areas of susceptibility are present in this area, corresponding to acute hemorrhage. There is mild hyperdense hemorrhage on the coronal CT from earlier today. Following contrast administration, there is mild enhancement within the area of edema. Enhancing area is somewhat ring-like and measures approximately 10 mm. In addition, there is a small left occipital subdural hematoma. No other enhancing lesion identified. No areas of restricted diffusion Vascular: Normal arterial flow voids Skull and upper cervical spine: No focal skeletal lesion. Sinuses/Orbits: Mild mucosal edema paranasal sinuses. Bilateral cataract extraction Other: None IMPRESSION: Left temporal lobe lesion corresponds to CT hypodensity. There is mild hemorrhage and enhancement most compatible with acute contusion. Small amount of high-density hemorrhage on CT is noted. Small left occipital subdural hematoma noted on FLAIR. This is compatible with contrecoup injury with right frontal scalp hematoma. Electronically Signed   By: Franchot Gallo M.D.   On: 08/06/2021 16:23    Labs on Admission: I have  personally reviewed following labs  CBC: Recent Labs  Lab 08/06/21 1055  WBC 6.9  HGB 14.0  HCT 41.9  MCV 89.5  PLT 382   Basic Metabolic Panel: Recent Labs  Lab 08/06/21 1055  NA 135  K 3.4*  CL 101  CO2 25  GLUCOSE 118*  BUN 15  CREATININE 0.84  CALCIUM 9.0   GFR: Estimated Creatinine Clearance: 42.7 mL/min (by C-G formula based on SCr of 0.84 mg/dL).  Liver Function Tests: Recent Labs  Lab 08/06/21 1055  AST 25  ALT 15  ALKPHOS 70  BILITOT 1.2  PROT 7.6  ALBUMIN 3.9   Recent Labs  Lab 08/06/21 1055  LIPASE 27   Dr. Tobie Poet [facility]  If 7PM-7AM, please contact overnight-coverage provider If 7AM-7PM, please contact day coverage provider [email or web address]  08/06/2021, 8:33 PM

## 2021-08-06 NOTE — Consult Note (Signed)
Neurosurgery-New Consultation Evaluation 08/06/2021 Jane Willis [id number]  Identifying Statement: Jane Willis is a 80 y.o. female from [place] [address] with a past medical history of HTN and HLD presenting after a mechanical fall down stairs on 08/05/21. She states she was carrying a crock pot of meatballs to bring to a funeral when she lost her footing. She cannot recall if she hit her head or lost consciousness and her fall was unwitnessed. She was however have a bruise on her right temple. She has had some trouble with word finding and nausea with associated vomiting since and photophobia which prompted her ED visit per her son at bedside. She denies any focal neurologic deficit or seizures. She is not on any blood thinners.   Physician Requesting Consultation: No ref. provider found  History of Present Illness:   Past Medical History:  Past Medical History:  Diagnosis Date   Allergy    Seasonal   Arthritis    Chicken pox    Hyperlipidemia    Hypertension    Urinary incontinence     Social History: Social History   Socioeconomic History   Marital status: Widowed    Spouse name: Not on file   Number of children: Not on file   Years of education: Not on file   Highest education level: Not on file  Occupational History   Not on file  Tobacco Use   Smoking status: Never   Smokeless tobacco: Never  Vaping Use   Vaping Use: Never used  Substance and Sexual Activity   Alcohol use: No    Alcohol/week: 0.0 standard drinks   Drug use: No   Sexual activity: Never    Partners: Male  Other Topics Concern   Not on file  Social History Narrative   Widowed   Retired   Children 3   Pets: none   Caffeine- Coffee, Tea, rare soda   Social Determinants of Radio broadcast assistant Strain: Low Risk    Difficulty of Paying Living Expenses: Not hard at all  Food Insecurity: No Food Insecurity   Worried About Charity fundraiser in the Last Year: Never true    Arboriculturist in the Last Year: Never true  Transportation Needs: No Transportation Needs   Lack of Transportation (Medical): No   Lack of Transportation (Non-Medical): No  Physical Activity: Sufficiently Active   Days of Exercise per Week: 5 days   Minutes of Exercise per Session: 30 min  Stress: No Stress Concern Present   Feeling of Stress : Not at all  Social Connections: Moderately Integrated   Frequency of Communication with Friends and Family: Twice a week   Frequency of Social Gatherings with Friends and Family: Twice a week   Attends Religious Services: 1 to 4 times per year   Active Member of [employer] or Organizations: Yes   Attends Archivist Meetings: 1 to 4 times per year   Marital Status: Widowed  Human resources officer Violence: Not At Risk   Fear of Current or Ex-Partner: No   Emotionally Abused: No   Physically Abused: No   Sexually Abused: No     Family History: Family History  Problem Relation Age of Onset   Arthritis Mother    Hypertension Mother    Arthritis Father    Hypertension Father    Arthritis Maternal Grandmother    Cancer Brother        Colon   Cancer Maternal Aunt  Breast Cancer   Breast cancer Maternal Aunt    Breast cancer Cousin    Breast cancer Cousin    Hyperlipidemia Son    Hyperlipidemia Son     Review of Systems:  Review of Systems - General ROS: Negative Psychological ROS: Negative Ophthalmic ROS: Negative ENT ROS: Negative Hematological and Lymphatic ROS: Negative  Endocrine ROS: Negative Respiratory ROS: Negative Cardiovascular ROS: Negative Gastrointestinal ROS: Negative Genito-Urinary ROS: Negative Musculoskeletal ROS: Negative Neurological ROS: Negative Dermatological ROS: Negative  Physical Exam: BP (!) 170/74 (BP Location: Right Arm)   Pulse 87   Temp 98.1 F (36.7 C) (Oral)   Resp 17   Ht 5\' 3"  (1.6 m)   Wt 62.6 kg   SpO2 98%   BMI 24.45 kg/m  Body mass index is 24.45 kg/m. Body surface area  is 1.67 meters squared. General appearance: Alert, cooperative, in no acute distress Head: Normocephalic, atraumatic Eyes: Normal, EOM intact Oropharynx: Moist without lesions Neck: Supple, no tenderness Lungs: no SOB Abdomen: Soft, nondistended Ext: No edema in LE bilaterally, good distal pulses. There is a second degree burn with blistering to her left wrist  Neurologic exam:  Mental status: alertness: alert, orientation: person, place, time, affect: normal Speech: fluent and clear. Difficulty identifying some objects (cup and badge) Cranial nerves:  II: Visual fields are full by confrontation, no ptosis III/IV/VI: extra-ocular motions intact bilaterally V/VII:no evidence of facial droop or weakness and facial sensation intact VIII: hearing normal XI: trapezius strength symmetric,  sternocleidomastoid strength symmetric XII: tongue strength symmetric  Motor:strength symmetric 5/5, normal muscle mass and tone in all extremities and no pronator drift Sensory: intact to light touch in all extremities Reflexes: 2+ and symmetric bilaterally for arms and legs Coordination: intact finger to nose Gait: untested  Laboratory: Results for orders placed or performed during the hospital encounter of 08/06/21  Comprehensive metabolic panel  Result Value Ref Range   Sodium 135 135 - 145 mmol/L   Potassium 3.4 (L) 3.5 - 5.1 mmol/L   Chloride 101 98 - 111 mmol/L   CO2 25 22 - 32 mmol/L   Glucose, Bld 118 (H) 70 - 99 mg/dL   BUN 15 8 - 23 mg/dL   Creatinine, Ser 0.84 0.44 - 1.00 mg/dL   Calcium 9.0 8.9 - 10.3 mg/dL   Total Protein 7.6 6.5 - 8.1 g/dL   Albumin 3.9 3.5 - 5.0 g/dL   AST 25 15 - 41 U/L   ALT 15 0 - 44 U/L   Alkaline Phosphatase 70 38 - 126 U/L   Total Bilirubin 1.2 0.3 - 1.2 mg/dL   GFR, Estimated >60 >60 mL/min   Anion gap 9 5 - 15  CBC  Result Value Ref Range   WBC 6.9 4.0 - 10.5 K/uL   RBC 4.68 3.87 - 5.11 MIL/uL   Hemoglobin 14.0 12.0 - 15.0 g/dL   HCT 41.9 36.0 -  46.0 %   MCV 89.5 80.0 - 100.0 fL   MCH 29.9 26.0 - 34.0 pg   MCHC 33.4 30.0 - 36.0 g/dL   RDW 13.7 11.5 - 15.5 %   Platelets 317 150 - 400 K/uL   nRBC 0.0 0.0 - 0.2 %  Lipase, blood  Result Value Ref Range   Lipase 27 11 - 51 U/L   I personally reviewed labs  Imaging: Results for orders placed during the hospital encounter of 08/06/21  MR BRAIN W WO CONTRAST  Narrative CLINICAL DATA:  Fall.  Neuro deficit.  Rule  out stroke.  EXAM: MRI HEAD WITHOUT AND WITH CONTRAST  TECHNIQUE: Multiplanar, multiecho pulse sequences of the brain and surrounding structures were obtained without and with intravenous contrast.  CONTRAST:  79mL GADAVIST GADOBUTROL 1 MMOL/ML IV SOLN  COMPARISON:  CT head 08/06/2021  FINDINGS: Brain: Mild generalized atrophy.  Negative for hydrocephalus.  T2 and FLAIR hyperintensity in the left posterolateral temporal lobe corresponding to a low-density area on CT earlier today. Small areas of susceptibility are present in this area, corresponding to acute hemorrhage. There is mild hyperdense hemorrhage on the coronal CT from earlier today. Following contrast administration, there is mild enhancement within the area of edema. Enhancing area is somewhat ring-like and measures approximately 10 mm. In addition, there is a small left occipital subdural hematoma. No other enhancing lesion identified. No areas of restricted diffusion  Vascular: Normal arterial flow voids  Skull and upper cervical spine: No focal skeletal lesion.  Sinuses/Orbits: Mild mucosal edema paranasal sinuses. Bilateral cataract extraction  Other: None  IMPRESSION: Left temporal lobe lesion corresponds to CT hypodensity. There is mild hemorrhage and enhancement most compatible with acute contusion. Small amount of high-density hemorrhage on CT is noted. Small left occipital subdural hematoma noted on FLAIR. This is compatible with contrecoup injury with right frontal  scalp hematoma.   Electronically Signed By: Franchot Gallo M.D. On: 08/06/2021 16:23  I personally reviewed radiology studies to include:   Impression/Plan:     1.  Diagnosis: left temporal lobe lesion consistent with temporal contusion from mechanical fall  2.  Plan - recommend 500mg  Keppra BID x 7 days - repeat head CT in 6 hours  - no plans for acute neurosurgical intervention at this time - will follow up outpatient  Cooper Render PA-C Neurosurgery

## 2021-08-07 DIAGNOSIS — E876 Hypokalemia: Secondary | ICD-10-CM | POA: Diagnosis not present

## 2021-08-07 DIAGNOSIS — I10 Essential (primary) hypertension: Secondary | ICD-10-CM | POA: Diagnosis not present

## 2021-08-07 DIAGNOSIS — S065XAA Traumatic subdural hemorrhage with loss of consciousness status unknown, initial encounter: Secondary | ICD-10-CM | POA: Diagnosis not present

## 2021-08-07 LAB — BASIC METABOLIC PANEL
Anion gap: 6 (ref 5–15)
BUN: 19 mg/dL (ref 8–23)
CO2: 24 mmol/L (ref 22–32)
Calcium: 8.2 mg/dL — ABNORMAL LOW (ref 8.9–10.3)
Chloride: 102 mmol/L (ref 98–111)
Creatinine, Ser: 0.84 mg/dL (ref 0.44–1.00)
GFR, Estimated: >60 mL/min (ref >60)
Glucose, Bld: 92 mg/dL (ref 70–99)
Potassium: 3.3 mmol/L — ABNORMAL LOW (ref 3.5–5.1)
Sodium: 132 mmol/L — ABNORMAL LOW (ref 135–145)

## 2021-08-07 LAB — URINALYSIS, COMPLETE (UACMP) WITH MICROSCOPIC
Bilirubin Urine: NEGATIVE
Glucose, UA: NEGATIVE mg/dL
Ketones, ur: 5 mg/dL — AB
Nitrite: NEGATIVE
Protein, ur: NEGATIVE mg/dL
Specific Gravity, Urine: 1.019 (ref 1.005–1.030)
WBC, UA: >50 WBC/hpf — ABNORMAL HIGH (ref 0–5)
pH: 5 (ref 5.0–8.0)

## 2021-08-07 LAB — CBC
HCT: 37.6 % (ref 36.0–46.0)
Hemoglobin: 12.5 g/dL (ref 12.0–15.0)
MCH: 30 pg (ref 26.0–34.0)
MCHC: 33.2 g/dL (ref 30.0–36.0)
MCV: 90.4 fL (ref 80.0–100.0)
Platelets: 233 10*3/uL (ref 150–400)
RBC: 4.16 MIL/uL (ref 3.87–5.11)
RDW: 13.8 % (ref 11.5–15.5)
WBC: 6.4 10*3/uL (ref 4.0–10.5)
nRBC: 0 % (ref 0.0–0.2)

## 2021-08-07 MED ORDER — POTASSIUM CHLORIDE CRYS ER 20 MEQ PO TBCR
40.0000 meq | EXTENDED_RELEASE_TABLET | Freq: Once | ORAL | Status: AC
  Administered 2021-08-07: 40 meq via ORAL
  Filled 2021-08-07: qty 2

## 2021-08-07 NOTE — ED Notes (Signed)
Responded to call bell, Pt requesting to go to bathroom. Pt able to ambulate to commode with minimal assistance. Pt performed self hygiene and returned to stretcher. Pt resting comfortably.

## 2021-08-07 NOTE — Evaluation (Signed)
Occupational Therapy Evaluation Patient Details Name: Jane Willis MRN: [id number] DOB: 11-28-1940 Today's Date: 08/07/2021   History of Present Illness Pt is an 80 y/o F with PMH: HTN and HLD who is adm for Subdural hematoma secondary to mechanical fall.   Clinical Impression   Pt seen for OT evaluation this date in setting of acute hospitalization s/p fall. Pt reports being INDEP at baseline including completing all ADLs/IADLs I'ly, running errands and volunteering. She has a supportive son who is present in room with pt during session. Pt presents this date with some difficulty wrod finding noted on OT assessment (MD notified and consulted SLP). In addition, mild balance deficits with more dynamic tasks. She is able to complete ADL transfers with CGA to SUPV, demos good control and no gross LOB. She completes fxl mobility with no AD with CGA to SBA. Pt primarily limited with dynamic reaching to simulate IADL tasks which seems primarily r/t decreased confidence d/t recent fall. She is generally close to her functional baseline, and should not need HHOT f/u, but will continue to follow acutely to improve confidence for IADLs before d/c to home environment.      Recommendations for follow up therapy are one component of a multi-disciplinary discharge planning process, led by the attending physician.  Recommendations may be updated based on patient status, additional functional criteria and insurance authorization.   Follow Up Recommendations  No OT follow up;Supervision - Intermittent    Equipment Recommendations  3 in 1 bedside commode;Tub/shower seat    Recommendations for Other Services       Precautions / Restrictions Precautions Precautions: Fall Restrictions Weight Bearing Restrictions: No      Mobility Bed Mobility Overal bed mobility: Modified Independent                  Transfers Overall transfer level: Needs assistance Equipment used: None Transfers: Sit  to/from Stand Sit to Stand: Supervision;Min guard         General transfer comment: CGA initialyl with progress to SUPV, primarily appears d/t some decreased confidence s/p fall    Balance Overall balance assessment: Mild deficits observed, not formally tested                                         ADL either performed or assessed with clinical judgement   ADL                                         General ADL Comments: requires increased time for seated LB ADLs, MIN A to SUPV with ADL transfers and mobility with no equipment.     Vision Patient Visual Report: No change from baseline Vision Assessment?: Yes Eye Alignment: Within Functional Limits Ocular Range of Motion: Within Functional Limits Alignment/Gaze Preference: Within Defined Limits Additional Comments: some decreased tolerance for tracking purely 2/2 headache, but what aspects of vision can be reasonbly assessed with consideration to pt's bruising, appears appropriate and in tact.     Perception     Praxis      Pertinent Vitals/Pain Pain Assessment: 0-10 Pain Score: 5  Pain Location: headache Pain Descriptors / Indicators: Aching;Discomfort Pain Intervention(s): Limited activity within patient's tolerance;Monitored during session     Hand Dominance     Extremity/Trunk Assessment Upper Extremity  Assessment Upper Extremity Assessment: Overall WFL for tasks assessed   Lower Extremity Assessment Lower Extremity Assessment: Overall WFL for tasks assessed       Communication Communication Communication: Other (comment) (noted word-finding difficulties, mentioned to MD who consulted SLP)   Cognition Arousal/Alertness: Awake/alert Behavior During Therapy: WFL for tasks assessed/performed Overall Cognitive Status: Impaired/Different from baseline                                 General Comments: Pt is oriented, but noted to have some word-finding  difficulties intermittently throughout. Not overtly apparent, but becomes apparent with higher level conversation   General Comments       Exercises Other Exercises Other Exercises: OT ed re: role of OT in acute setting.   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  OT Frequency: Min 1X/week   Barriers to D/C:            Co-evaluation              AM-PAC OT "6 Clicks" Daily Activity     Outcome Measure Help from another person eating meals?: None Help from another person taking care of personal grooming?: None Help from another person toileting, which includes using toliet, bedpan, or urinal?: A Little Help from another person bathing (including washing, rinsing, drying)?: A Little Help from another person to put on and taking off regular upper body clothing?: None   6 Click Score: 18   End of Session Equipment Utilized During Treatment: Gait belt Nurse  Communication: Mobility status  Activity Tolerance: Patient tolerated treatment well Patient left: in bed;with call bell/phone within reach;with family/visitor present  OT Visit Diagnosis: Unsteadiness on feet (R26.81);Muscle weakness (generalized) (M62.81)                Time: 1497-0263 OT Time Calculation (min): 9 min Charges:  OT General Charges $OT Visit: 1 Visit OT Evaluation $OT Eval Low Complexity: [place], [place], OTR/L ascom [phone] 08/07/21, 6:37 PM

## 2021-08-07 NOTE — Progress Notes (Signed)
PROGRESS NOTE    Jane Willis  EXH:[id number] DOB: 21-Sep-1941 DOA: 08/06/2021 PCP: Leone Haven, MD   Assessment & Plan:   Principal Problem:   Subdural hematoma Active Problems:   Hyperlipidemia   Hypertension   Family history of colon cancer   B12 deficiency   History of COVID-19  Subdural hematoma: secondary to mechanical fall. Continue on keppra x 1 week as per neuro surg. Will need outpatient neuro surg f/u.  Neuro surg recs apprec. Repeat CT head Unchanged area of hypodensity in left temporal lobe, consistent with contusion, with unchanged small area of hemorrhage at the inferior aspect, as seen on the prior CT and MRI. No new area of hemorrhage. Continue on tele   HTN: continue on home dose of amlodipine. IV labetalol prn   Hypokalemia: KCl repleated   Ecchymosis of the right frontal/temporal lobe: present on admission and likely secondary to fall    Left hand wound: present on admission and possibly secondary to fall    DVT prophylaxis: (SCDs Code Status: DNR Family Communication: discussed pt's care w/ pt's family at bedside and answered their questions  Disposition Plan: depends on PT/OT recs   Level of care: Progressive Cardiac   Status is: Observation  The patient remains OBS appropriate and will d/c before 2 midnights.   Consultants:  Neuro surg   Procedures:   Antimicrobials:    Subjective: Pt c/o body aches and malaise  Objective: Vitals:   08/07/21 0100 08/07/21 0200 08/07/21 0300 08/07/21 0800  BP: 121/72 (!) 153/89 (!) 135/110 123/80  Pulse:  61 (!) 56 (!) 59  Resp: 17 15 15 16   Temp: 97.9 F (36.6 C)     TempSrc: Oral     SpO2: 99% 95% 91% 96%  Weight:      Height:       No intake or output data in the 24 hours ending 08/07/21 0916 Filed Weights   08/06/21 1051  Weight: 62.6 kg    Examination:  General exam: Appears calm and comfortable  Respiratory system: Clear to auscultation. Respiratory effort  normal. Cardiovascular system: S1 & S2+. No rubs, gallops or clicks.  Gastrointestinal system: Abdomen is nondistended, soft and nontender. Normal bowel sounds heard. Central nervous system: Alert and oriented. Moves all extremities  Psychiatry: Judgement and insight appear normal. Flat mood and affect    Data Reviewed: I have personally reviewed following labs and imaging studies  CBC: Recent Labs  Lab 08/06/21 1055 08/07/21 0436  WBC 6.9 6.4  HGB 14.0 12.5  HCT 41.9 37.6  MCV 89.5 90.4  PLT 317 381   Basic Metabolic Panel: Recent Labs  Lab 08/06/21 1055 08/07/21 0436  NA 135 132*  K 3.4* 3.3*  CL 101 102  CO2 25 24  GLUCOSE 118* 92  BUN 15 19  CREATININE 0.84 0.84  CALCIUM 9.0 8.2*   GFR: Estimated Creatinine Clearance: 42.7 mL/min (by C-G formula based on SCr of 0.84 mg/dL). Liver Function Tests: Recent Labs  Lab 08/06/21 1055  AST 25  ALT 15  ALKPHOS 70  BILITOT 1.2  PROT 7.6  ALBUMIN 3.9   Recent Labs  Lab 08/06/21 1055  LIPASE 27   No results for input(s): AMMONIA in the last 168 hours. Coagulation Profile: No results for input(s): INR, PROTIME in the last 168 hours. Cardiac Enzymes: No results for input(s): CKTOTAL, CKMB, CKMBINDEX, TROPONINI in the last 168 hours. BNP (last 3 results) No results for input(s): PROBNP in the last 8760  hours. HbA1C: No results for input(s): HGBA1C in the last 72 hours. CBG: No results for input(s): GLUCAP in the last 168 hours. Lipid Profile: No results for input(s): CHOL, HDL, LDLCALC, TRIG, CHOLHDL, LDLDIRECT in the last 72 hours. Thyroid Function Tests: No results for input(s): TSH, T4TOTAL, FREET4, T3FREE, THYROIDAB in the last 72 hours. Anemia Panel: No results for input(s): VITAMINB12, FOLATE, FERRITIN, TIBC, IRON, RETICCTPCT in the last 72 hours. Sepsis Labs: No results for input(s): PROCALCITON, LATICACIDVEN in the last 168 hours.  Recent Results (from the past 240 hour(s))  Resp Panel by RT-PCR  (Flu A&B, Covid) Nasopharyngeal Swab     Status: None   Collection Time: 08/06/21  5:17 PM   Specimen: Nasopharyngeal Swab; Nasopharyngeal(NP) swabs in vial transport medium  Result Value Ref Range Status   SARS Coronavirus 2 by RT PCR NEGATIVE NEGATIVE Final    Comment: (NOTE) SARS-CoV-2 target nucleic acids are NOT DETECTED.  The SARS-CoV-2 RNA is generally detectable in upper respiratory specimens during the acute phase of infection. The lowest concentration of SARS-CoV-2 viral copies this assay can detect is 138 copies/mL. A negative result does not preclude SARS-Cov-2 infection and should not be used as the sole basis for treatment or other patient management decisions. A negative result may occur with  improper specimen collection/handling, submission of specimen other than nasopharyngeal swab, presence of viral mutation(s) within the areas targeted by this assay, and inadequate number of viral copies(<138 copies/mL). A negative result must be combined with clinical observations, patient history, and epidemiological information. The expected result is Negative.  Fact Sheet for Patients:  EntrepreneurPulse.com.au  Fact Sheet for Healthcare Providers:  IncredibleEmployment.be  This test is no t yet approved or cleared by the [place] FDA and  has been authorized for detection and/or diagnosis of SARS-CoV-2 by FDA under an Emergency Use Authorization (EUA). This EUA will remain  in effect (meaning this test can be used) for the duration of the COVID-19 declaration under Section 564(b)(1) of the Act, 21 U.S.C.section 360bbb-3(b)(1), unless the authorization is terminated  or revoked sooner.       Influenza A by PCR NEGATIVE NEGATIVE Final   Influenza B by PCR NEGATIVE NEGATIVE Final    Comment: (NOTE) The Xpert Xpress SARS-CoV-2/FLU/RSV plus assay is intended as an aid in the diagnosis of influenza from Nasopharyngeal swab specimens  and should not be used as a sole basis for treatment. Nasal washings and aspirates are unacceptable for Xpert Xpress SARS-CoV-2/FLU/RSV testing.  Fact Sheet for Patients: EntrepreneurPulse.com.au  Fact Sheet for Healthcare Providers: IncredibleEmployment.be  This test is not yet approved or cleared by the [place] FDA and has been authorized for detection and/or diagnosis of SARS-CoV-2 by FDA under an Emergency Use Authorization (EUA). This EUA will remain in effect (meaning this test can be used) for the duration of the COVID-19 declaration under Section 564(b)(1) of the Act, 21 U.S.C. section 360bbb-3(b)(1), unless the authorization is terminated or revoked.  Performed at [facility], [address] [place], [place] [address]          Radiology Studies: CT HEAD WO CONTRAST (5MM)  Result Date: 08/06/2021 CLINICAL DATA:  Parenchymal hemorrhage follow-up EXAM: CT HEAD WITHOUT CONTRAST TECHNIQUE: Contiguous axial images were obtained from the base of the skull through the vertex without intravenous contrast. COMPARISON:  CT 08/06/2021, MRI head 08/06/2021. FINDINGS: Brain: Redemonstrated area of hypoattenuation in the inferior left temporal lobe (series 5, image 9), with a small area of hyperdensity (series 5,  image 8), unchanged from the prior exam, and consistent with contusion with small amount of hemorrhage. Previously noted trace left occipital subdural hematoma is not appreciated on CT. No new area of hemorrhage, acute infarct, mass, mass effect, or midline shift. No hydrocephalus. Periventricular white matter changes, likely the sequela of chronic small vessel ischemic disease. Vascular: No hyperdense vessel. Atherosclerotic calcifications in the intracranial carotid and vertebral arteries. Skull: No acute fracture Sinuses/Orbits: No acute finding. Status post bilateral lens replacements. Other: Unchanged right frontotemporal  scalp hematoma. IMPRESSION: 1. Unchanged area of hypodensity in left temporal lobe, consistent with contusion, with unchanged small area of hemorrhage at the inferior aspect, as seen on the prior CT and MRI. No new area of hemorrhage. 2. The trace left occipital subdural hemorrhage seen on prior MRI is not appreciated on CT. Electronically Signed   By: Merilyn Baba M.D.   On: 08/06/2021 20:27   CT HEAD WO CONTRAST (5MM)  Result Date: 08/06/2021 CLINICAL DATA:  Mental status change, unknown cause; technologist note states recent fall EXAM: CT HEAD WITHOUT CONTRAST TECHNIQUE: Contiguous axial images were obtained from the base of the skull through the vertex without intravenous contrast. COMPARISON:  None. FINDINGS: Brain: There is no acute intracranial hemorrhage or mass effect. Patchy and confluent hypoattenuation in the supratentorial white matter is nonspecific but may reflect mild to moderate chronic microvascular ischemic changes. There is a small wedge-shaped area of low-density and loss of gray-white differentiation involving the inferior left temporal lobe without apparent volume loss. There is no extra-axial fluid collection. Prominence of the ventricles and sulci reflects minor parenchymal volume loss. Vascular: No hyperdense vessel.There is atherosclerotic calcification at the skull base. Skull: Calvarium is unremarkable. Sinuses/Orbits: No acute finding. Other: Right frontotemporal scalp soft tissue swelling/hematoma. IMPRESSION: No acute intracranial hemorrhage. Age-indeterminate infarct or nonhemorrhagic contusion of the inferior left temporal lobe. Chronic microvascular ischemic changes. Electronically Signed   By: Macy Mis M.D.   On: 08/06/2021 11:46   CT Cervical Spine Wo Contrast  Result Date: 08/06/2021 CLINICAL DATA:  Neck trauma.  Fell yesterday, falling down steps EXAM: CT CERVICAL SPINE WITHOUT CONTRAST TECHNIQUE: Multidetector CT imaging of the cervical spine was performed  without intravenous contrast. Multiplanar CT image reconstructions were also generated. COMPARISON:  None. FINDINGS: Alignment: Straightening and mild kyphotic curvature in the cervical region. Skull base and vertebrae: No regional fracture or focal bone lesion. Soft tissues and spinal canal: No traumatic soft tissue finding. Disc levels: Foramen magnum is widely patent. There is chronic arthropathy of the C1-2 articulation without compressive encroachment upon the spinal canal. C2-3: Facet osteoarthritis left worse than right. Mild bony foraminal narrowing on the left. C3-4: Endplate osteophytes. Bilateral facet osteoarthritis worse on the left. Bilateral bony foraminal stenosis. C4-5: Endplate osteophytes. Mild facet degeneration. Bony foraminal stenosis on the left. C5-6: Endplate osteophytes.  Bilateral bony foraminal stenosis. C6-7: Endplate osteophytes and mild bulging of the disc. No likely compressive stenosis. C7-T1: Mild uncovertebral prominence.  No compressive stenosis. Upper chest: Negative Other: None IMPRESSION: No acute or traumatic finding. Chronic degenerative changes throughout the cervical region as outlined above. No likely compressive central canal stenosis. Multiple foraminal stenoses as listed. Electronically Signed   By: Nelson Chimes M.D.   On: 08/06/2021 14:22   MR BRAIN W WO CONTRAST  Result Date: 08/06/2021 CLINICAL DATA:  Fall.  Neuro deficit.  Rule out stroke. EXAM: MRI HEAD WITHOUT AND WITH CONTRAST TECHNIQUE: Multiplanar, multiecho pulse sequences of the brain and surrounding structures were obtained  without and with intravenous contrast. CONTRAST:  76mL GADAVIST GADOBUTROL 1 MMOL/ML IV SOLN COMPARISON:  CT head 08/06/2021 FINDINGS: Brain: Mild generalized atrophy.  Negative for hydrocephalus. T2 and FLAIR hyperintensity in the left posterolateral temporal lobe corresponding to a low-density area on CT earlier today. Small areas of susceptibility are present in this area,  corresponding to acute hemorrhage. There is mild hyperdense hemorrhage on the coronal CT from earlier today. Following contrast administration, there is mild enhancement within the area of edema. Enhancing area is somewhat ring-like and measures approximately 10 mm. In addition, there is a small left occipital subdural hematoma. No other enhancing lesion identified. No areas of restricted diffusion Vascular: Normal arterial flow voids Skull and upper cervical spine: No focal skeletal lesion. Sinuses/Orbits: Mild mucosal edema paranasal sinuses. Bilateral cataract extraction Other: None IMPRESSION: Left temporal lobe lesion corresponds to CT hypodensity. There is mild hemorrhage and enhancement most compatible with acute contusion. Small amount of high-density hemorrhage on CT is noted. Small left occipital subdural hematoma noted on FLAIR. This is compatible with contrecoup injury with right frontal scalp hematoma. Electronically Signed   By: Franchot Gallo M.D.   On: 08/06/2021 16:23        Scheduled Meds:  amLODipine  10 mg Oral Daily   levETIRAcetam  500 mg Oral BID   Continuous Infusions:  promethazine (PHENERGAN) injection (IM or IVPB)       LOS: 0 days    Time spent: 36 mins     Wyvonnia Dusky, MD [facility] Pager [phone]  If 7PM-7AM, please contact night-coverage 08/07/2021, 9:16 AM

## 2021-08-08 ENCOUNTER — Encounter: Payer: Self-pay | Admitting: Internal Medicine

## 2021-08-08 ENCOUNTER — Other Ambulatory Visit: Payer: Self-pay

## 2021-08-08 DIAGNOSIS — S065XAA Traumatic subdural hemorrhage with loss of consciousness status unknown, initial encounter: Secondary | ICD-10-CM | POA: Diagnosis not present

## 2021-08-08 DIAGNOSIS — R519 Headache, unspecified: Secondary | ICD-10-CM

## 2021-08-08 DIAGNOSIS — I10 Essential (primary) hypertension: Secondary | ICD-10-CM | POA: Diagnosis not present

## 2021-08-08 LAB — BASIC METABOLIC PANEL
Anion gap: 4 — ABNORMAL LOW (ref 5–15)
BUN: 15 mg/dL (ref 8–23)
CO2: 26 mmol/L (ref 22–32)
Calcium: 8.5 mg/dL — ABNORMAL LOW (ref 8.9–10.3)
Chloride: 104 mmol/L (ref 98–111)
Creatinine, Ser: 0.77 mg/dL (ref 0.44–1.00)
GFR, Estimated: >60 mL/min (ref >60)
Glucose, Bld: 103 mg/dL — ABNORMAL HIGH (ref 70–99)
Potassium: 4.2 mmol/L (ref 3.5–5.1)
Sodium: 134 mmol/L — ABNORMAL LOW (ref 135–145)

## 2021-08-08 LAB — CBC
HCT: 34.7 % — ABNORMAL LOW (ref 36.0–46.0)
Hemoglobin: 12 g/dL (ref 12.0–15.0)
MCH: 31.3 pg (ref 26.0–34.0)
MCHC: 34.6 g/dL (ref 30.0–36.0)
MCV: 90.4 fL (ref 80.0–100.0)
Platelets: 260 10*3/uL (ref 150–400)
RBC: 3.84 MIL/uL — ABNORMAL LOW (ref 3.87–5.11)
RDW: 13.3 % (ref 11.5–15.5)
WBC: 5.1 10*3/uL (ref 4.0–10.5)
nRBC: 0 % (ref 0.0–0.2)

## 2021-08-08 MED ORDER — OXYCODONE-ACETAMINOPHEN 5-325 MG PO TABS: 1.0000 | ORAL_TABLET | Freq: Four times a day (QID) | ORAL | Status: DC | PRN

## 2021-08-08 MED ORDER — MORPHINE SULFATE (PF) 2 MG/ML IV SOLN
1.0000 mg | INTRAVENOUS | Status: DC | PRN
Start: 2021-08-08 — End: 2021-08-09

## 2021-08-08 NOTE — Progress Notes (Signed)
PROGRESS NOTE    Jane Willis  PRF:[id number] DOB: 10-12-1941 DOA: 08/06/2021 PCP: Leone Haven, MD   Assessment & Plan:   Principal Problem:   Subdural hematoma Active Problems:   Hyperlipidemia   Hypertension   Family history of colon cancer   B12 deficiency   History of COVID-19  Subdural hematoma: secondary to mechanical fall. Still constant headaches and light sensitivity. Continue on keppra x 1 week as per neuro surg. Will need outpatient neuro surg f/u.  Neuro surg recs apprec. Repeat CT head unchanged area of hypodensity in left temporal lobe, consistent with contusion, with unchanged small area of hemorrhage at the inferior aspect, as seen on the prior CT and MRI. No new area of hemorrhage. Continue on tele   HTN: continue on home dose of CCB.   Hypokalemia: WNL today    Ecchymosis of the right frontal/temporal lobe: present on admission and likely secondary to fall    Left hand wound: present on admission and possibly secondary to fall    DVT prophylaxis: SCDs Code Status: DNR Family Communication: discussed pt's care w/ pt's family and answered their questions  Disposition Plan: likely d/c back home   Level of care: Progressive Cardiac   Status is: Observation  The patient remains OBS appropriate and will d/c before 2 midnights.   Consultants:  Neuro surg   Procedures:   Antimicrobials:    Subjective: Pt c/o headaches and light sensitivities.   Objective: Vitals:   08/08/21 0240 08/08/21 0400 08/08/21 0630 08/08/21 0807  BP: 116/73 135/70 (!) 153/79 130/72  Pulse: 70   72  Resp: 17 17 17 17   Temp:      TempSrc:      SpO2: 94% 97%  97%  Weight:      Height:       No intake or output data in the 24 hours ending 08/08/21 0908 Filed Weights   08/06/21 1051  Weight: 62.6 kg    Examination:  General exam: Appears uncomfortable  Respiratory system: clear breath sounds b/l Cardiovascular system: S1/S2+. No rubs or clicks   Gastrointestinal system: Abd is soft, NT, ND & hypoactive bowel sounds  Central nervous system: alert and oriented. Moves all extremities  Psychiatry: Judgement and insight appears normal. Flat mood and affect    Data Reviewed: I have personally reviewed following labs and imaging studies  CBC: Recent Labs  Lab 08/06/21 1055 08/07/21 0436 08/08/21 0611  WBC 6.9 6.4 5.1  HGB 14.0 12.5 12.0  HCT 41.9 37.6 34.7*  MCV 89.5 90.4 90.4  PLT 317 233 935   Basic Metabolic Panel: Recent Labs  Lab 08/06/21 1055 08/07/21 0436 08/08/21 0611  NA 135 132* 134*  K 3.4* 3.3* 4.2  CL 101 102 104  CO2 25 24 26   GLUCOSE 118* 92 103*  BUN 15 19 15   CREATININE 0.84 0.84 0.77  CALCIUM 9.0 8.2* 8.5*   GFR: Estimated Creatinine Clearance: 44.8 mL/min (by C-G formula based on SCr of 0.77 mg/dL). Liver Function Tests: Recent Labs  Lab 08/06/21 1055  AST 25  ALT 15  ALKPHOS 70  BILITOT 1.2  PROT 7.6  ALBUMIN 3.9   Recent Labs  Lab 08/06/21 1055  LIPASE 27   No results for input(s): AMMONIA in the last 168 hours. Coagulation Profile: No results for input(s): INR, PROTIME in the last 168 hours. Cardiac Enzymes: No results for input(s): CKTOTAL, CKMB, CKMBINDEX, TROPONINI in the last 168 hours. BNP (last 3 results) No results  for input(s): PROBNP in the last 8760 hours. HbA1C: No results for input(s): HGBA1C in the last 72 hours. CBG: No results for input(s): GLUCAP in the last 168 hours. Lipid Profile: No results for input(s): CHOL, HDL, LDLCALC, TRIG, CHOLHDL, LDLDIRECT in the last 72 hours. Thyroid Function Tests: No results for input(s): TSH, T4TOTAL, FREET4, T3FREE, THYROIDAB in the last 72 hours. Anemia Panel: No results for input(s): VITAMINB12, FOLATE, FERRITIN, TIBC, IRON, RETICCTPCT in the last 72 hours. Sepsis Labs: No results for input(s): PROCALCITON, LATICACIDVEN in the last 168 hours.  Recent Results (from the past 240 hour(s))  Resp Panel by RT-PCR (Flu  A&B, Covid) Nasopharyngeal Swab     Status: None   Collection Time: 08/06/21  5:17 PM   Specimen: Nasopharyngeal Swab; Nasopharyngeal(NP) swabs in vial transport medium  Result Value Ref Range Status   SARS Coronavirus 2 by RT PCR NEGATIVE NEGATIVE Final    Comment: (NOTE) SARS-CoV-2 target nucleic acids are NOT DETECTED.  The SARS-CoV-2 RNA is generally detectable in upper respiratory specimens during the acute phase of infection. The lowest concentration of SARS-CoV-2 viral copies this assay can detect is 138 copies/mL. A negative result does not preclude SARS-Cov-2 infection and should not be used as the sole basis for treatment or other patient management decisions. A negative result may occur with  improper specimen collection/handling, submission of specimen other than nasopharyngeal swab, presence of viral mutation(s) within the areas targeted by this assay, and inadequate number of viral copies(<138 copies/mL). A negative result must be combined with clinical observations, patient history, and epidemiological information. The expected result is Negative.  Fact Sheet for Patients:  EntrepreneurPulse.com.au  Fact Sheet for Healthcare Providers:  IncredibleEmployment.be  This test is no t yet approved or cleared by the [place] FDA and  has been authorized for detection and/or diagnosis of SARS-CoV-2 by FDA under an Emergency Use Authorization (EUA). This EUA will remain  in effect (meaning this test can be used) for the duration of the COVID-19 declaration under Section 564(b)(1) of the Act, 21 U.S.C.section 360bbb-3(b)(1), unless the authorization is terminated  or revoked sooner.       Influenza A by PCR NEGATIVE NEGATIVE Final   Influenza B by PCR NEGATIVE NEGATIVE Final    Comment: (NOTE) The Xpert Xpress SARS-CoV-2/FLU/RSV plus assay is intended as an aid in the diagnosis of influenza from Nasopharyngeal swab specimens  and should not be used as a sole basis for treatment. Nasal washings and aspirates are unacceptable for Xpert Xpress SARS-CoV-2/FLU/RSV testing.  Fact Sheet for Patients: EntrepreneurPulse.com.au  Fact Sheet for Healthcare Providers: IncredibleEmployment.be  This test is not yet approved or cleared by the [place] FDA and has been authorized for detection and/or diagnosis of SARS-CoV-2 by FDA under an Emergency Use Authorization (EUA). This EUA will remain in effect (meaning this test can be used) for the duration of the COVID-19 declaration under Section 564(b)(1) of the Act, 21 U.S.C. section 360bbb-3(b)(1), unless the authorization is terminated or revoked.  Performed at [facility], [address] [place], [place] [address]          Radiology Studies: CT HEAD WO CONTRAST (5MM)  Result Date: 08/06/2021 CLINICAL DATA:  Parenchymal hemorrhage follow-up EXAM: CT HEAD WITHOUT CONTRAST TECHNIQUE: Contiguous axial images were obtained from the base of the skull through the vertex without intravenous contrast. COMPARISON:  CT 08/06/2021, MRI head 08/06/2021. FINDINGS: Brain: Redemonstrated area of hypoattenuation in the inferior left temporal lobe (series 5, image 9), with  a small area of hyperdensity (series 5, image 8), unchanged from the prior exam, and consistent with contusion with small amount of hemorrhage. Previously noted trace left occipital subdural hematoma is not appreciated on CT. No new area of hemorrhage, acute infarct, mass, mass effect, or midline shift. No hydrocephalus. Periventricular white matter changes, likely the sequela of chronic small vessel ischemic disease. Vascular: No hyperdense vessel. Atherosclerotic calcifications in the intracranial carotid and vertebral arteries. Skull: No acute fracture Sinuses/Orbits: No acute finding. Status post bilateral lens replacements. Other: Unchanged right frontotemporal  scalp hematoma. IMPRESSION: 1. Unchanged area of hypodensity in left temporal lobe, consistent with contusion, with unchanged small area of hemorrhage at the inferior aspect, as seen on the prior CT and MRI. No new area of hemorrhage. 2. The trace left occipital subdural hemorrhage seen on prior MRI is not appreciated on CT. Electronically Signed   By: Merilyn Baba M.D.   On: 08/06/2021 20:27   CT HEAD WO CONTRAST (5MM)  Result Date: 08/06/2021 CLINICAL DATA:  Mental status change, unknown cause; technologist note states recent fall EXAM: CT HEAD WITHOUT CONTRAST TECHNIQUE: Contiguous axial images were obtained from the base of the skull through the vertex without intravenous contrast. COMPARISON:  None. FINDINGS: Brain: There is no acute intracranial hemorrhage or mass effect. Patchy and confluent hypoattenuation in the supratentorial white matter is nonspecific but may reflect mild to moderate chronic microvascular ischemic changes. There is a small wedge-shaped area of low-density and loss of gray-white differentiation involving the inferior left temporal lobe without apparent volume loss. There is no extra-axial fluid collection. Prominence of the ventricles and sulci reflects minor parenchymal volume loss. Vascular: No hyperdense vessel.There is atherosclerotic calcification at the skull base. Skull: Calvarium is unremarkable. Sinuses/Orbits: No acute finding. Other: Right frontotemporal scalp soft tissue swelling/hematoma. IMPRESSION: No acute intracranial hemorrhage. Age-indeterminate infarct or nonhemorrhagic contusion of the inferior left temporal lobe. Chronic microvascular ischemic changes. Electronically Signed   By: Macy Mis M.D.   On: 08/06/2021 11:46   CT Cervical Spine Wo Contrast  Result Date: 08/06/2021 CLINICAL DATA:  Neck trauma.  Fell yesterday, falling down steps EXAM: CT CERVICAL SPINE WITHOUT CONTRAST TECHNIQUE: Multidetector CT imaging of the cervical spine was performed  without intravenous contrast. Multiplanar CT image reconstructions were also generated. COMPARISON:  None. FINDINGS: Alignment: Straightening and mild kyphotic curvature in the cervical region. Skull base and vertebrae: No regional fracture or focal bone lesion. Soft tissues and spinal canal: No traumatic soft tissue finding. Disc levels: Foramen magnum is widely patent. There is chronic arthropathy of the C1-2 articulation without compressive encroachment upon the spinal canal. C2-3: Facet osteoarthritis left worse than right. Mild bony foraminal narrowing on the left. C3-4: Endplate osteophytes. Bilateral facet osteoarthritis worse on the left. Bilateral bony foraminal stenosis. C4-5: Endplate osteophytes. Mild facet degeneration. Bony foraminal stenosis on the left. C5-6: Endplate osteophytes.  Bilateral bony foraminal stenosis. C6-7: Endplate osteophytes and mild bulging of the disc. No likely compressive stenosis. C7-T1: Mild uncovertebral prominence.  No compressive stenosis. Upper chest: Negative Other: None IMPRESSION: No acute or traumatic finding. Chronic degenerative changes throughout the cervical region as outlined above. No likely compressive central canal stenosis. Multiple foraminal stenoses as listed. Electronically Signed   By: Nelson Chimes M.D.   On: 08/06/2021 14:22   MR BRAIN W WO CONTRAST  Result Date: 08/06/2021 CLINICAL DATA:  Fall.  Neuro deficit.  Rule out stroke. EXAM: MRI HEAD WITHOUT AND WITH CONTRAST TECHNIQUE: Multiplanar, multiecho pulse sequences of  the brain and surrounding structures were obtained without and with intravenous contrast. CONTRAST:  61mL GADAVIST GADOBUTROL 1 MMOL/ML IV SOLN COMPARISON:  CT head 08/06/2021 FINDINGS: Brain: Mild generalized atrophy.  Negative for hydrocephalus. T2 and FLAIR hyperintensity in the left posterolateral temporal lobe corresponding to a low-density area on CT earlier today. Small areas of susceptibility are present in this area,  corresponding to acute hemorrhage. There is mild hyperdense hemorrhage on the coronal CT from earlier today. Following contrast administration, there is mild enhancement within the area of edema. Enhancing area is somewhat ring-like and measures approximately 10 mm. In addition, there is a small left occipital subdural hematoma. No other enhancing lesion identified. No areas of restricted diffusion Vascular: Normal arterial flow voids Skull and upper cervical spine: No focal skeletal lesion. Sinuses/Orbits: Mild mucosal edema paranasal sinuses. Bilateral cataract extraction Other: None IMPRESSION: Left temporal lobe lesion corresponds to CT hypodensity. There is mild hemorrhage and enhancement most compatible with acute contusion. Small amount of high-density hemorrhage on CT is noted. Small left occipital subdural hematoma noted on FLAIR. This is compatible with contrecoup injury with right frontal scalp hematoma. Electronically Signed   By: Franchot Gallo M.D.   On: 08/06/2021 16:23        Scheduled Meds:  amLODipine  10 mg Oral Daily   levETIRAcetam  500 mg Oral BID   Continuous Infusions:  promethazine (PHENERGAN) injection (IM or IVPB)       LOS: 0 days    Time spent: 25 mins     Wyvonnia Dusky, MD [facility] Pager [phone]  If 7PM-7AM, please contact night-coverage 08/08/2021, 9:08 AM

## 2021-08-08 NOTE — Evaluation (Signed)
Physical Therapy Evaluation Patient Details Name: Jane Willis MRN: [id number] DOB: [birth year] Today's Date: 08/08/2021  History of Present Illness  Pt is an 80 y.o. female presenting to ED 10/19 s/p fall day prior (lost balance going down steps carrying food).  Blistering noted L wrist.  Pt also noted with some trouble with word finding and N/V and photophobia.  Pt admitted with SDH secondary to mechanical fall, ecchymosis of R frontal/temporal lobe.  PMH includes HLD, htn, urinary incontinence, h/o COVID-19, h/o low impact fx of ankle L s/p sx, and breast sx.  Clinical Impression  Prior to hospital admission, pt was independent with functional mobility and active; lives alone on main level of home with 5 STE.  Currently pt is modified independent with bed mobility, CGA progressing to SBA with transfers, and CGA to ambulate 40 feet x2 (to bathroom and back) no AD.  Pt appearing more cautious with gait (d/t headache and light sensitivity which limited mobility) but no loss of balance noted.  Pt with 8/10 headache during session (no HA increase with activity); nurse notified of pt's request for pain meds.  BP 130/87 prior to ambulation and BP 144/85 post ambulation; HR 74-96 bpm during sessions activities; O2 sats 97% or greater on room air during session.  Pt would benefit from skilled PT to address noted impairments and functional limitations (see below for any additional details).  Upon hospital discharge, pt would benefit from HHPT and initial 24/7 assist with functional mobility for safety (d/t light sensitivity and headache limiting mobility)--discussed with pt.    Recommendations for follow up therapy are one component of a multi-disciplinary discharge planning process, led by the attending physician.  Recommendations may be updated based on patient status, additional functional criteria and insurance authorization.  Follow Up Recommendations Home health PT;Supervision/Assistance - 24 hour     Equipment Recommendations  None recommended by PT    Recommendations for Other Services       Precautions / Restrictions Precautions Precautions: Fall Restrictions Weight Bearing Restrictions: No      Mobility  Bed Mobility Overal bed mobility: Modified Independent             General bed mobility comments: stretcher HOB elevated; no difficulties noted    Transfers Overall transfer level: Needs assistance Equipment used: None Transfers: Sit to/from Stand Sit to Stand: Supervision;Min guard         General transfer comment: pt more cautious during transfers but steady (standing 1x from ED stretcher and 1x from toilet)  Ambulation/Gait Ambulation/Gait assistance: Min guard Gait Distance (Feet):  (40 feet x2 (to/from bathroom)) Assistive device: None   Gait velocity: decreased   General Gait Details: pt with more cautious gait; L LE externally rotated (pt reports d/t h/o L ankle fx and surgery)  Stairs            Wheelchair Mobility    Modified Rankin (Stroke Patients Only)       Balance Overall balance assessment: Needs assistance Sitting-balance support: No upper extremity supported;Feet supported Sitting balance-Leahy Scale: Normal Sitting balance - Comments: steady sitting reaching outside BOS   Standing balance support: No upper extremity supported;During functional activity Standing balance-Leahy Scale: Good Standing balance comment: no loss of balance with ambulation (although pt appearing more cautious); no loss of balance with standing washing hands at sink; no loss of balance throwing paper towel in trash while opening trash can with foot (foot on pedal to open trash can)  Pertinent Vitals/Pain Pain Assessment: 0-10 Pain Score: 8  Pain Location: Headache (over L eye; L forehead; and L frontal temporal area) Pain Descriptors / Indicators: Headache Pain Intervention(s): Limited activity within  patient's tolerance;Monitored during session;Repositioned;Patient requesting pain meds-RN notified    Home Living Family/patient expects to be discharged to:: Private residence Living Arrangements: Alone Available Help at Discharge: Family Type of Home: House Home Access: Stairs to enter   [employer] of Steps: 5 steps from garage (no railing); 5 steps front porch (B railings--can reach both at same time) Home Layout: One level;Laundry or work area in basement (does sewing in basement) Home Equipment: None      Prior Function Level of Independence: Independent         Comments: (+) driving; active; volunteers (pt escort at hospital)     Hand Dominance        Extremity/Trunk Assessment   Upper Extremity Assessment Upper Extremity Assessment: Overall WFL for tasks assessed    Lower Extremity Assessment Lower Extremity Assessment: Overall WFL for tasks assessed    Cervical / Trunk Assessment Cervical / Trunk Assessment: Normal  Communication   Communication:  (very occasional word finding difficulties initially but improved during session)  Cognition Arousal/Alertness: Awake/alert Behavior During Therapy: WFL for tasks assessed/performed Overall Cognitive Status: Within Functional Limits for tasks assessed                                        General Comments General comments (skin integrity, edema, etc.): brusing noted R eye area.  Nursing cleared pt for participation in physical therapy.  Pt agreeable to PT session.    Exercises     Assessment/Plan    PT Assessment Patient needs continued PT services  PT Problem List Decreased strength;Decreased activity tolerance;Decreased balance;Decreased mobility;Decreased knowledge of precautions;Pain       PT Treatment Interventions DME instruction;Gait training;Stair training;Functional mobility training;Therapeutic activities;Therapeutic exercise;Balance training;Patient/family education     PT Goals (Current goals can be found in the Care Plan section)  Acute Rehab PT Goals Patient Stated Goal: to go home PT Goal Formulation: With patient Time For Goal Achievement: 08/22/21 Potential to Achieve Goals: Good    Frequency 7X/week   Barriers to discharge        Co-evaluation               AM-PAC PT "6 Clicks" Mobility  Outcome Measure Help needed turning from your back to your side while in a flat bed without using bedrails?: None Help needed moving from lying on your back to sitting on the side of a flat bed without using bedrails?: None Help needed moving to and from a bed to a chair (including a wheelchair)?: A Little Help needed standing up from a chair using your arms (e.g., wheelchair or bedside chair)?: A Little Help needed to walk in hospital room?: A Little Help needed climbing 3-5 steps with a railing? : A Little 6 Click Score: 20    End of Session Equipment Utilized During Treatment: Gait belt Activity Tolerance: Other (comment) (Limited d/t headache and light sensitivity) Patient left: in bed;with call bell/phone within reach;Other (comment) (ED stretcher bed in lowest position with both railings up) Nurse Communication: Mobility status;Patient requests pain meds;Precautions PT Visit Diagnosis: Other abnormalities of gait and mobility (R26.89);Muscle weakness (generalized) (M62.81);History of falling (Z91.81)    Time: 6754-4920 PT Time Calculation (min) (ACUTE ONLY): 31  min   Charges:   PT Evaluation $PT Eval Low Complexity: 1 Low PT Treatments $Therapeutic Activity: 8-22 mins       Leitha Bleak, PT 08/08/21, 9:57 AM

## 2021-08-08 NOTE — ED Notes (Signed)
Patient ambulated to the bathroom. Patient was stable and steady on her feet. Able to walk back to room. Sitting up to eat lunch.

## 2021-08-08 NOTE — Evaluation (Addendum)
Speech Language Pathology Evaluation Patient Details Name: Jane Willis MRN: [id number] DOB: 08-16-1941 Today's Date: 08/08/2021 Time: 1330-1405 SLP Time Calculation (min) (ACUTE ONLY): 35 min  Problem List:  Patient Active Problem List   Diagnosis Date Noted   Subdural hematoma 08/06/2021   History of COVID-19 03/01/2020   Prediabetes 07/06/2018   Age-related osteoporosis without current pathological fracture 12/29/2017   B12 deficiency 12/29/2017   Subcutaneous nodule 06/29/2017   Allergic rhinitis 03/19/2017   Family history of colon cancer 02/11/2017   Hypertension 12/31/2016   Hyperlipidemia 07/03/2016   History of low impact fracture of ankle 07/03/2016   Bilateral finger numbness 04/02/2016   Past Medical History:  Past Medical History:  Diagnosis Date   Allergy    Seasonal   Arthritis    Chicken pox    Hyperlipidemia    Hypertension    Urinary incontinence    Past Surgical History:  Past Surgical History:  Procedure Laterality Date   ABDOMINAL HYSTERECTOMY  1986   BREAST EXCISIONAL BIOPSY Left 1970's   benign   BREAST SURGERY  1968   CATARACT EXTRACTION  2014/2015   Both eyes   FRACTURE SURGERY  01/2009   Fractured Left ankle, plate on outside of ankle 2 rods through/across ankle from inside of ankle   OOPHORECTOMY     TONSILLECTOMY AND ADENOIDECTOMY     HPI:  Per H&P on 08/06/21, "Jane Willis is a 80 y.o. female with medical history significant for hypertension, who presents emergency department from home for chief concerns of a fall and confusion.   Patient was on her way to a funeral.  She had made meatballs and a crockpot.  She was carrying a hot crockpot to the garage and as she was stepping down she tripped and fell.  The crockpot shattered.  She endorses head trauma however does not know where.  She denies loss of consciousness and or syncope.   She endorses following the event she vomited several times does not know how many times she  vomited.  On day of presentation she endorses dry gagging but no vomiting.   She reports this is never happened before.  She reports that otherwise she performs her own ADLs and has been very lucky with excellent health.  She reports that there was a couple of episodes of blood mixed with mucus in her vomitus.  She also endorses 3 episodes of diarrhea after the fall 08/05/2021.  She denies bright red blood.  She endorses the diarrhea is dark brown."    Pertinent test results:  Most recent Head CT, 08/06/21: 1. Unchanged area of hypodensity in left temporal lobe, consistent with contusion, with unchanged small area of hemorrhage at the inferior aspect, as seen on the prior CT and MRI. No new area of hemorrhage. 2. The trace left occipital subdural hemorrhage seen on prior MRI is not appreciated on CT.   Subjective: Pt alert, pleasant, and cooperative. Endorsed 6/10 pain on L side of head and photosensitivity. RN aware. Son present for end of session for education.  Pt/Family Stated Goal: to return to [facility]; to volunteer at hospital, be active at church, and travel  Assessment / Plan / Recommendation Clinical Impression  Pt seen for speech/language evaluation via informal means. Pt's speech is clear and without s/sx dyarthria. Pt demonstrated intact basic and complex auditory comprehension. Verbal expression is c/b paucity of speech, some hesitations, and infrequent anomia for low frequency words. Pt with emerging use of circumlocution strategy to compensate  for anomic events. x1 instance of semantic paraphasia noted during informal conversational exchanges. Pt with mild difficulty with divergent naming and confrontation naming tasks. Verbal repetition, responsive naming, and convergent naming are [facility]. Further assessment of functional reading/writing to be completed at next level of care. Pt able to read menu items without difficulty.  Full cognitive-linguistic evaluation deferred at this time  given pt's language deficits; however, pt A&Ox4, demonstrates good recall of events precipitating this admission, verbalized safe solutions to routine/household problems, and appears to have good insight into CLOF.  Pt appropriately tearful at times throughout evaluation. Supportive counseling provided re: changes to speech/language following SDH. Pt and son educated re: results of speech/language evaluation, current speech/language deficits, compensatory strategies for anomia, environmental modifications to improve functional communication, role of SLP,  d/c recommendations (including initial 24 hour supervision/assistance) and SLP POC. Pt and verbalized understanding/agreement with education provided. RN also made aware of results of assessment, current SLP recommendations, and SLP POC.  Of note, pt with lunch tray at bedside. Pt denies difficulty chewing or swallowing at this time.   Recommend [facility] SLP services to address higher level cognitive-linguistic functioning in home environment. Anticipate need for 24 hour supervision/assistance at d/c given above mentioned deficits.     SLP Assessment  SLP Recommendation/Assessment: All further Speech Language Pathology  needs can be addressed in the next venue of care SLP Visit Diagnosis: Cognitive communication deficit (R41.841)    Recommendations for follow up therapy are one component of a multi-disciplinary discharge planning process, led by the attending physician.  Recommendations may be updated based on patient status, additional functional criteria and insurance authorization.    Follow Up Recommendations  Home health SLP;24 hour supervision/assistance    Frequency and Duration N/A - to bet set at next level of care      SLP Evaluation Cognition  Arousal/Alertness: Awake/alert Orientation Level: Oriented X4 Attention:  [facility] for participation in speech/language evaluation) Awareness: Appears intact Problem Solving: Appears intact  (verbal) Safety/Judgment: Appears intact       Comprehension  Auditory Comprehension Overall Auditory Comprehension: Appears within functional limits for tasks assessed Visual Recognition/Discrimination Discrimination: Within Function Limits Reading Comprehension Reading Status:  (no difficulty reading menu items; would benefit from further assessment at next level of care given pt's is an avid reader)    Expression Expression Primary Mode of Expression: Verbal Verbal Expression Overall Verbal Expression: Impaired Initiation: No impairment Automatic Speech:  (WFL) Repetition: No impairment Naming: Impairment Responsive:  (WFL; extra time) Confrontation: Impaired Convergent: 75-100% accurate (impaired for low frequency words) Divergent:  (WFL) Verbal Errors: Semantic paraphasias Pragmatics:  (appropriately tearful, at times) Effective Techniques:  (extra time; pt with emerging use of circumlocution) Written Expression Written Expression: Not tested   Oral / Motor  Oral Motor/Sensory Function Overall Oral Motor/Sensory Function: Within functional limits Motor Speech Overall Motor Speech: Appears within functional limits for tasks assessed Respiration: Within functional limits Phonation: Normal Resonance: Within functional limits Articulation: Within functional limitis Intelligibility: Intelligible Motor Planning: Witnin functional limits                     Cherrie Gauze, M.S., CCC-SLP  Quintella Baton 08/08/2021, 2:52 PM

## 2021-08-08 NOTE — ED Notes (Signed)
Informed RN bed assigned 

## 2021-08-09 DIAGNOSIS — S065XAA Traumatic subdural hemorrhage with loss of consciousness status unknown, initial encounter: Secondary | ICD-10-CM | POA: Diagnosis not present

## 2021-08-09 DIAGNOSIS — I10 Essential (primary) hypertension: Secondary | ICD-10-CM | POA: Diagnosis not present

## 2021-08-09 DIAGNOSIS — E871 Hypo-osmolality and hyponatremia: Secondary | ICD-10-CM | POA: Diagnosis not present

## 2021-08-09 LAB — CBC
HCT: 36.1 % (ref 36.0–46.0)
Hemoglobin: 12.3 g/dL (ref 12.0–15.0)
MCH: 30.4 pg (ref 26.0–34.0)
MCHC: 34.1 g/dL (ref 30.0–36.0)
MCV: 89.4 fL (ref 80.0–100.0)
Platelets: 293 10*3/uL (ref 150–400)
RBC: 4.04 MIL/uL (ref 3.87–5.11)
RDW: 13.2 % (ref 11.5–15.5)
WBC: 5 10*3/uL (ref 4.0–10.5)
nRBC: 0 % (ref 0.0–0.2)

## 2021-08-09 LAB — BASIC METABOLIC PANEL
Anion gap: 5 (ref 5–15)
BUN: 12 mg/dL (ref 8–23)
CO2: 25 mmol/L (ref 22–32)
Calcium: 8.5 mg/dL — ABNORMAL LOW (ref 8.9–10.3)
Chloride: 104 mmol/L (ref 98–111)
Creatinine, Ser: 0.74 mg/dL (ref 0.44–1.00)
GFR, Estimated: >60 mL/min (ref >60)
Glucose, Bld: 101 mg/dL — ABNORMAL HIGH (ref 70–99)
Potassium: 3.9 mmol/L (ref 3.5–5.1)
Sodium: 134 mmol/L — ABNORMAL LOW (ref 135–145)

## 2021-08-09 MED ORDER — ASPIRIN 81 MG PO TABS: 81.0000 mg | ORAL_TABLET | Freq: Every day | ORAL | Status: DC

## 2021-08-09 MED ORDER — ONDANSETRON HCL 4 MG PO TABS: 4.0000 mg | ORAL_TABLET | Freq: Three times a day (TID) | ORAL | 0 refills | Status: AC | PRN

## 2021-08-09 MED ORDER — OXYCODONE-ACETAMINOPHEN 5-325 MG PO TABS: 1.0000 | ORAL_TABLET | Freq: Four times a day (QID) | ORAL | 0 refills | Status: AC | PRN

## 2021-08-09 MED ORDER — LEVETIRACETAM 500 MG PO TABS: 500.0000 mg | ORAL_TABLET | Freq: Two times a day (BID) | ORAL | 0 refills | Status: DC

## 2021-08-09 NOTE — Progress Notes (Signed)
Discharge papers also reviewed with patient's grandson, who will be transporting patient home. Verbalized understanding. D/c home in stable condition.

## 2021-08-09 NOTE — Progress Notes (Signed)
Patient verbalized understanding of all discharge instructions.

## 2021-08-09 NOTE — Progress Notes (Signed)
Physical Therapy Treatment Patient Details Name: Jane Willis MRN: [id number] DOB: December 14, 1940 Today's Date: 08/09/2021   History of Present Illness Pt is an 80 y.o. female presenting to ED 10/19 s/p fall day prior (lost balance going down steps carrying food).  Blistering noted L wrist.  Pt also noted with some trouble with word finding and N/V and photophobia.  Pt admitted with SDH secondary to mechanical fall, ecchymosis of R frontal/temporal lobe.  PMH includes HLD, htn, urinary incontinence, h/o COVID-19, h/o low impact fx of ankle L s/p sx, and breast sx.    PT Comments    Pt seen for pt tx with grandson & great-grandson in room. Pt is able to ambulate 2 laps around nurses station without AD & CGA without overt LOB. Pt negotiates 6 steps with rails (reports she can use front door where she has rails to hold vs garage with no rails) with CGA. Educated pt & family members on recommendation of CGA upon d/c, especially when pt negotiates stairs with them all voicing understanding.    Recommendations for follow up therapy are one component of a multi-disciplinary discharge planning process, led by the attending physician.  Recommendations may be updated based on patient status, additional functional criteria and insurance authorization.  Follow Up Recommendations  Home health PT;Supervision/Assistance - 24 hour     Equipment Recommendations  None recommended by PT    Recommendations for Other Services       Precautions / Restrictions Precautions Precautions: Fall Restrictions Weight Bearing Restrictions: No     Mobility  Bed Mobility Overal bed mobility: Modified Independent             General bed mobility comments: supine<>sit with mod I    Transfers Overall transfer level: Needs assistance Equipment used: None Transfers: Sit to/from Stand Sit to Stand: Supervision            Ambulation/Gait Ambulation/Gait assistance: Min guard Gait Distance (Feet):  350 Feet Assistive device: None Gait Pattern/deviations: Staggering left;Staggering right Gait velocity: decreased   General Gait Details: Pt appears to have LE length descrepency (LLE longer than RLE?) as pt ambulates with LLE slightly abducted & externally rotated.   Stairs Stairs: Yes Stairs assistance: Min guard Stair Management: One rail Left;One rail Right Number of Stairs: 6 General stair comments: ascends with R rail with step over step pattern, descends with opposite rail with step-to pattern   Wheelchair Mobility    Modified Rankin (Stroke Patients Only)       Balance Overall balance assessment: Needs assistance Sitting-balance support: No upper extremity supported;Feet supported Sitting balance-Leahy Scale: Normal       Standing balance-Leahy Scale: Good Standing balance comment: Slight staggering but no over LOB during gait/stairs. Will need CGA for stair negotiation upon initial return home.                            Cognition Arousal/Alertness: Awake/alert Behavior During Therapy: WFL for tasks assessed/performed Overall Cognitive Status: Within Functional Limits for tasks assessed                                 General Comments: Very pleasant & agreeable to tx.      Exercises      General Comments        Pertinent Vitals/Pain Pain Assessment: Faces Faces Pain Scale: No hurt    Home Living  Prior Function            PT Goals (current goals can now be found in the care plan section) Acute Rehab PT Goals Patient Stated Goal: to go home PT Goal Formulation: With patient Time For Goal Achievement: 08/22/21 Potential to Achieve Goals: Good Progress towards PT goals: Progressing toward goals    Frequency    7X/week      PT Plan Current plan remains appropriate    Co-evaluation              AM-PAC PT "6 Clicks" Mobility   Outcome Measure  Help needed turning from  your back to your side while in a flat bed without using bedrails?: None Help needed moving from lying on your back to sitting on the side of a flat bed without using bedrails?: None Help needed moving to and from a bed to a chair (including a wheelchair)?: A Little Help needed standing up from a chair using your arms (e.g., wheelchair or bedside chair)?: None Help needed to walk in hospital room?: A Little Help needed climbing 3-5 steps with a railing? : A Little 6 Click Score: 21    End of Session Equipment Utilized During Treatment: Gait belt Activity Tolerance: Patient tolerated treatment well Patient left: in bed;with call bell/phone within reach;with family/visitor present   PT Visit Diagnosis: Muscle weakness (generalized) (M62.81);History of falling (Z91.81);Unsteadiness on feet (R26.81)     Time: 1165-7903 PT Time Calculation (min) (ACUTE ONLY): 11 min  Charges:  $Therapeutic Activity: 8-22 mins                     Lavone Nian, PT, DPT 08/09/21, 1:04 PM    Waunita Schooner 08/09/2021, 1:03 PM

## 2021-08-09 NOTE — Progress Notes (Signed)
CSW spoke with patient who declined home health services. Patient stated she believes she will be fine with her family helping her in the home. Patient stated she feels better already and also felt good walking to the bathroom. Patient stated she might need home health in the future. Patient stated her grandson will be picking her up.

## 2021-08-09 NOTE — Discharge Summary (Signed)
Physician Discharge Summary  Jane Willis DTO:[id number] DOB: Sep 02, 1941 DOA: 08/06/2021  PCP: Leone Haven, MD  Admit date: 08/06/2021 Discharge date: 08/09/2021  Admitted From: home  Disposition:  home   Recommendations for Outpatient Follow-up:  Follow up with PCP in 1-2 weeks F/u w/ neuro surg., Dr. Izora Ribas, in 1-2 weeks   Home Health: no as pt refused home health  Equipment/Devices:  Discharge Condition: stable  CODE STATUS:DNR  Diet recommendation: Heart Healthy    Brief/Interim Summary: HPI was taken from Dr. Tobie Poet: Jane Willis is a 80 y.o. female with medical history significant for hypertension, who presents emergency department from home for chief concerns of a fall and confusion.   Patient was on her way to a funeral.  She had made meatballs and a crockpot.  She was carrying a hot crockpot to the garage and as she was stepping down she tripped and fell.  The crockpot shattered.  She endorses head trauma however does not know where.  She denies loss of consciousness and or syncope.   She endorses following the event she vomited several times does not know how many times she vomited.  On day of presentation she endorses dry gagging but no vomiting.   She reports this is never happened before.  She reports that otherwise she performs her own ADLs and has been very lucky with excellent health.  She reports that there was a couple of episodes of blood mixed with mucus in her vomitus.  She also endorses 3 episodes of diarrhea after the fall 08/05/2021.  She denies bright red blood.  She endorses the diarrhea is dark brown.   Social history: Patient lives at home by herself and performs her own ADLs.  Hospital course from Dr. Jimmye Norman 10/20-10/22/22: Pt was found to have subdural hematoma after a mechanical fall at home. Pt was started on keppra for seizure prophylaxis x 1 week as per neuro surg. Neuro surg evaluated pt and recommended conservative management. Pt  will f/u outpatient w/ neuro surg and pt verbalized her understanding. Pt did have headaches, nausea, & light sensitivity while in the hospital. Pt was d/c home w/ percocet prn, zofran prn & keppra to finish up the prophylaxis course for seizures. For more information, please see previous progress/consult notes.    Discharge Diagnoses:  Principal Problem:   Subdural hematoma Active Problems:   Hyperlipidemia   Hypertension   Family history of colon cancer   B12 deficiency   History of COVID-19  Subdural hematoma: secondary to mechanical fall. Improved headaches, nausea and light sensitivity. Continue on keppra x 1 week as per neuro surg. Will need outpatient neuro surg f/u.  Neuro surg recs apprec. Continue on tele   HTN: continue on home dose of CCB.   Hypokalemia: WNL today  Hyponatremia: labile but almost WNL today     Ecchymosis of the right frontal/temporal lobe: present on admission and likely secondary to fall    Left hand wound: present on admission and possibly secondary to fall   Discharge Instructions  Discharge Instructions     Diet - low sodium heart healthy   Complete by: As directed    Discharge instructions   Complete by: As directed    F/u w/ neuro surg, Dr. Izora Ribas, in 1-2 weeks. F/u w/ PCP in 1-2 week   Increase activity slowly   Complete by: As directed       Allergies as of 08/09/2021   No Known Allergies  Medication List     TAKE these medications    amLODipine 10 MG tablet Commonly known as: NORVASC TAKE 1 TABLET(10 MG) BY MOUTH DAILY   aspirin 81 MG tablet Take 1 tablet (81 mg total) by mouth daily. Do not take this medication for 14 days What changed: additional instructions   Fish Oil 1360 MG Caps Take by mouth.   levETIRAcetam 500 MG tablet Commonly known as: KEPPRA Take 1 tablet (500 mg total) by mouth 2 (two) times daily for 3 days.   ondansetron 4 MG tablet Commonly known as: ZOFRAN Take 1 tablet (4 mg total) by  mouth every 8 (eight) hours as needed for up to 7 days for nausea or vomiting.   oxyCODONE-acetaminophen 5-325 MG tablet Commonly known as: PERCOCET/ROXICET Take 1 tablet by mouth every 6 (six) hours as needed for up to 3 days for moderate pain.   vitamin B-12 1000 MCG tablet Commonly known as: CYANOCOBALAMIN   vitamin C 500 MG tablet Commonly known as: ASCORBIC ACID Take 500 mg by mouth daily.   Vitamin D-3 25 MCG (1000 UT) Caps Take 1,000 capsules by mouth 2 (two) times daily.               Durable Medical Equipment  (From admission, onward)           Start     Ordered   08/08/21 1750  For home use only DME 3 n 1  Once        08/08/21 1749            No Known Allergies  Consultations: Neuro surg    Procedures/Studies: CT HEAD WO CONTRAST (5MM)  Result Date: 08/06/2021 CLINICAL DATA:  Parenchymal hemorrhage follow-up EXAM: CT HEAD WITHOUT CONTRAST TECHNIQUE: Contiguous axial images were obtained from the base of the skull through the vertex without intravenous contrast. COMPARISON:  CT 08/06/2021, MRI head 08/06/2021. FINDINGS: Brain: Redemonstrated area of hypoattenuation in the inferior left temporal lobe (series 5, image 9), with a small area of hyperdensity (series 5, image 8), unchanged from the prior exam, and consistent with contusion with small amount of hemorrhage. Previously noted trace left occipital subdural hematoma is not appreciated on CT. No new area of hemorrhage, acute infarct, mass, mass effect, or midline shift. No hydrocephalus. Periventricular white matter changes, likely the sequela of chronic small vessel ischemic disease. Vascular: No hyperdense vessel. Atherosclerotic calcifications in the intracranial carotid and vertebral arteries. Skull: No acute fracture Sinuses/Orbits: No acute finding. Status post bilateral lens replacements. Other: Unchanged right frontotemporal scalp hematoma. IMPRESSION: 1. Unchanged area of hypodensity in left  temporal lobe, consistent with contusion, with unchanged small area of hemorrhage at the inferior aspect, as seen on the prior CT and MRI. No new area of hemorrhage. 2. The trace left occipital subdural hemorrhage seen on prior MRI is not appreciated on CT. Electronically Signed   By: Merilyn Baba M.D.   On: 08/06/2021 20:27   CT HEAD WO CONTRAST (5MM)  Result Date: 08/06/2021 CLINICAL DATA:  Mental status change, unknown cause; technologist note states recent fall EXAM: CT HEAD WITHOUT CONTRAST TECHNIQUE: Contiguous axial images were obtained from the base of the skull through the vertex without intravenous contrast. COMPARISON:  None. FINDINGS: Brain: There is no acute intracranial hemorrhage or mass effect. Patchy and confluent hypoattenuation in the supratentorial white matter is nonspecific but may reflect mild to moderate chronic microvascular ischemic changes. There is a small wedge-shaped area of low-density and loss of gray-white  differentiation involving the inferior left temporal lobe without apparent volume loss. There is no extra-axial fluid collection. Prominence of the ventricles and sulci reflects minor parenchymal volume loss. Vascular: No hyperdense vessel.There is atherosclerotic calcification at the skull base. Skull: Calvarium is unremarkable. Sinuses/Orbits: No acute finding. Other: Right frontotemporal scalp soft tissue swelling/hematoma. IMPRESSION: No acute intracranial hemorrhage. Age-indeterminate infarct or nonhemorrhagic contusion of the inferior left temporal lobe. Chronic microvascular ischemic changes. Electronically Signed   By: Macy Mis M.D.   On: 08/06/2021 11:46   CT Cervical Spine Wo Contrast  Result Date: 08/06/2021 CLINICAL DATA:  Neck trauma.  Fell yesterday, falling down steps EXAM: CT CERVICAL SPINE WITHOUT CONTRAST TECHNIQUE: Multidetector CT imaging of the cervical spine was performed without intravenous contrast. Multiplanar CT image reconstructions were  also generated. COMPARISON:  None. FINDINGS: Alignment: Straightening and mild kyphotic curvature in the cervical region. Skull base and vertebrae: No regional fracture or focal bone lesion. Soft tissues and spinal canal: No traumatic soft tissue finding. Disc levels: Foramen magnum is widely patent. There is chronic arthropathy of the C1-2 articulation without compressive encroachment upon the spinal canal. C2-3: Facet osteoarthritis left worse than right. Mild bony foraminal narrowing on the left. C3-4: Endplate osteophytes. Bilateral facet osteoarthritis worse on the left. Bilateral bony foraminal stenosis. C4-5: Endplate osteophytes. Mild facet degeneration. Bony foraminal stenosis on the left. C5-6: Endplate osteophytes.  Bilateral bony foraminal stenosis. C6-7: Endplate osteophytes and mild bulging of the disc. No likely compressive stenosis. C7-T1: Mild uncovertebral prominence.  No compressive stenosis. Upper chest: Negative Other: None IMPRESSION: No acute or traumatic finding. Chronic degenerative changes throughout the cervical region as outlined above. No likely compressive central canal stenosis. Multiple foraminal stenoses as listed. Electronically Signed   By: Nelson Chimes M.D.   On: 08/06/2021 14:22   MR BRAIN W WO CONTRAST  Result Date: 08/06/2021 CLINICAL DATA:  Fall.  Neuro deficit.  Rule out stroke. EXAM: MRI HEAD WITHOUT AND WITH CONTRAST TECHNIQUE: Multiplanar, multiecho pulse sequences of the brain and surrounding structures were obtained without and with intravenous contrast. CONTRAST:  25mL GADAVIST GADOBUTROL 1 MMOL/ML IV SOLN COMPARISON:  CT head 08/06/2021 FINDINGS: Brain: Mild generalized atrophy.  Negative for hydrocephalus. T2 and FLAIR hyperintensity in the left posterolateral temporal lobe corresponding to a low-density area on CT earlier today. Small areas of susceptibility are present in this area, corresponding to acute hemorrhage. There is mild hyperdense hemorrhage on the  coronal CT from earlier today. Following contrast administration, there is mild enhancement within the area of edema. Enhancing area is somewhat ring-like and measures approximately 10 mm. In addition, there is a small left occipital subdural hematoma. No other enhancing lesion identified. No areas of restricted diffusion Vascular: Normal arterial flow voids Skull and upper cervical spine: No focal skeletal lesion. Sinuses/Orbits: Mild mucosal edema paranasal sinuses. Bilateral cataract extraction Other: None IMPRESSION: Left temporal lobe lesion corresponds to CT hypodensity. There is mild hemorrhage and enhancement most compatible with acute contusion. Small amount of high-density hemorrhage on CT is noted. Small left occipital subdural hematoma noted on FLAIR. This is compatible with contrecoup injury with right frontal scalp hematoma. Electronically Signed   By: Franchot Gallo M.D.   On: 08/06/2021 16:23   (Echo, Carotid, EGD, Colonoscopy, ERCP)    Subjective: Pt c/o headache & light sensitivity but improved from day prior.    Discharge Exam: Vitals:   08/09/21 0741 08/09/21 1100  BP: (!) 142/81 131/72  Pulse: 67 67  Resp: 18 14  Temp: 98.2 F (36.8 C) 97.8 F (36.6 C)  SpO2: 94% 99%   Vitals:   08/09/21 0010 08/09/21 0439 08/09/21 0741 08/09/21 1100  BP: (!) 147/79 125/78 (!) 142/81 131/72  Pulse: 63 64 67 67  Resp: 16 16 18 14   Temp: 98.2 F (36.8 C) 98.4 F (36.9 C) 98.2 F (36.8 C) 97.8 F (36.6 C)  TempSrc:  Oral Oral Oral  SpO2: 97% 97% 94% 99%  Weight:      Height:        General: Pt is alert, awake, not in acute distress Cardiovascular: S1/S2 +, no rubs, no gallops Respiratory: CTA bilaterally, no wheezing, no rhonchi Abdominal: Soft, NT, ND, bowel sounds + Extremities: no edema, no cyanosis    The results of significant diagnostics from this hospitalization (including imaging, microbiology, ancillary and laboratory) are listed below for reference.      Microbiology: Recent Results (from the past 240 hour(s))  Resp Panel by RT-PCR (Flu A&B, Covid) Nasopharyngeal Swab     Status: None   Collection Time: 08/06/21  5:17 PM   Specimen: Nasopharyngeal Swab; Nasopharyngeal(NP) swabs in vial transport medium  Result Value Ref Range Status   SARS Coronavirus 2 by RT PCR NEGATIVE NEGATIVE Final    Comment: (NOTE) SARS-CoV-2 target nucleic acids are NOT DETECTED.  The SARS-CoV-2 RNA is generally detectable in upper respiratory specimens during the acute phase of infection. The lowest concentration of SARS-CoV-2 viral copies this assay can detect is 138 copies/mL. A negative result does not preclude SARS-Cov-2 infection and should not be used as the sole basis for treatment or other patient management decisions. A negative result may occur with  improper specimen collection/handling, submission of specimen other than nasopharyngeal swab, presence of viral mutation(s) within the areas targeted by this assay, and inadequate number of viral copies(<138 copies/mL). A negative result must be combined with clinical observations, patient history, and epidemiological information. The expected result is Negative.  Fact Sheet for Patients:  EntrepreneurPulse.com.au  Fact Sheet for Healthcare Providers:  IncredibleEmployment.be  This test is no t yet approved or cleared by the [place] FDA and  has been authorized for detection and/or diagnosis of SARS-CoV-2 by FDA under an Emergency Use Authorization (EUA). This EUA will remain  in effect (meaning this test can be used) for the duration of the COVID-19 declaration under Section 564(b)(1) of the Act, 21 U.S.C.section 360bbb-3(b)(1), unless the authorization is terminated  or revoked sooner.       Influenza A by PCR NEGATIVE NEGATIVE Final   Influenza B by PCR NEGATIVE NEGATIVE Final    Comment: (NOTE) The Xpert Xpress SARS-CoV-2/FLU/RSV plus assay is  intended as an aid in the diagnosis of influenza from Nasopharyngeal swab specimens and should not be used as a sole basis for treatment. Nasal washings and aspirates are unacceptable for Xpert Xpress SARS-CoV-2/FLU/RSV testing.  Fact Sheet for Patients: EntrepreneurPulse.com.au  Fact Sheet for Healthcare Providers: IncredibleEmployment.be  This test is not yet approved or cleared by the [place] FDA and has been authorized for detection and/or diagnosis of SARS-CoV-2 by FDA under an Emergency Use Authorization (EUA). This EUA will remain in effect (meaning this test can be used) for the duration of the COVID-19 declaration under Section 564(b)(1) of the Act, 21 U.S.C. section 360bbb-3(b)(1), unless the authorization is terminated or revoked.  Performed at [facility], [address] [place], [place] [address]      Labs: BNP (last 3 results) No results for input(s): BNP  in the last 8760 hours. Basic Metabolic Panel: Recent Labs  Lab 08/06/21 1055 08/07/21 0436 08/08/21 0611 08/09/21 0433  NA 135 132* 134* 134*  K 3.4* 3.3* 4.2 3.9  CL 101 102 104 104  CO2 25 24 26 25   GLUCOSE 118* 92 103* 101*  BUN 15 19 15 12   CREATININE 0.84 0.84 0.77 0.74  CALCIUM 9.0 8.2* 8.5* 8.5*   Liver Function Tests: Recent Labs  Lab 08/06/21 1055  AST 25  ALT 15  ALKPHOS 70  BILITOT 1.2  PROT 7.6  ALBUMIN 3.9   Recent Labs  Lab 08/06/21 1055  LIPASE 27   No results for input(s): AMMONIA in the last 168 hours. CBC: Recent Labs  Lab 08/06/21 1055 08/07/21 0436 08/08/21 0611 08/09/21 0433  WBC 6.9 6.4 5.1 5.0  HGB 14.0 12.5 12.0 12.3  HCT 41.9 37.6 34.7* 36.1  MCV 89.5 90.4 90.4 89.4  PLT 317 233 260 293   Cardiac Enzymes: No results for input(s): CKTOTAL, CKMB, CKMBINDEX, TROPONINI in the last 168 hours. BNP: Invalid input(s): POCBNP CBG: No results for input(s): GLUCAP in the last 168 hours. D-Dimer No results  for input(s): DDIMER in the last 72 hours. Hgb A1c No results for input(s): HGBA1C in the last 72 hours. Lipid Profile No results for input(s): CHOL, HDL, LDLCALC, TRIG, CHOLHDL, LDLDIRECT in the last 72 hours. Thyroid function studies No results for input(s): TSH, T4TOTAL, T3FREE, THYROIDAB in the last 72 hours.  Invalid input(s): FREET3 Anemia work up No results for input(s): VITAMINB12, FOLATE, FERRITIN, TIBC, IRON, RETICCTPCT in the last 72 hours. Urinalysis    Component Value Date/Time   COLORURINE YELLOW (A) 08/06/2021 1055   APPEARANCEUR CLOUDY (A) 08/06/2021 1055   LABSPEC 1.019 08/06/2021 1055   PHURINE 5.0 08/06/2021 1055   GLUCOSEU NEGATIVE 08/06/2021 1055   HGBUR SMALL (A) 08/06/2021 1055   BILIRUBINUR NEGATIVE 08/06/2021 1055   KETONESUR 5 (A) 08/06/2021 1055   PROTEINUR NEGATIVE 08/06/2021 1055   NITRITE NEGATIVE 08/06/2021 1055   LEUKOCYTESUR MODERATE (A) 08/06/2021 1055   Sepsis Labs Invalid input(s): PROCALCITONIN,  WBC,  LACTICIDVEN Microbiology Recent Results (from the past 240 hour(s))  Resp Panel by RT-PCR (Flu A&B, Covid) Nasopharyngeal Swab     Status: None   Collection Time: 08/06/21  5:17 PM   Specimen: Nasopharyngeal Swab; Nasopharyngeal(NP) swabs in vial transport medium  Result Value Ref Range Status   SARS Coronavirus 2 by RT PCR NEGATIVE NEGATIVE Final    Comment: (NOTE) SARS-CoV-2 target nucleic acids are NOT DETECTED.  The SARS-CoV-2 RNA is generally detectable in upper respiratory specimens during the acute phase of infection. The lowest concentration of SARS-CoV-2 viral copies this assay can detect is 138 copies/mL. A negative result does not preclude SARS-Cov-2 infection and should not be used as the sole basis for treatment or other patient management decisions. A negative result may occur with  improper specimen collection/handling, submission of specimen other than nasopharyngeal swab, presence of viral mutation(s) within the areas  targeted by this assay, and inadequate number of viral copies(<138 copies/mL). A negative result must be combined with clinical observations, patient history, and epidemiological information. The expected result is Negative.  Fact Sheet for Patients:  EntrepreneurPulse.com.au  Fact Sheet for Healthcare Providers:  IncredibleEmployment.be  This test is no t yet approved or cleared by the [place] FDA and  has been authorized for detection and/or diagnosis of SARS-CoV-2 by FDA under an Emergency Use Authorization (EUA). This EUA will remain  in  effect (meaning this test can be used) for the duration of the COVID-19 declaration under Section 564(b)(1) of the Act, 21 U.S.C.section 360bbb-3(b)(1), unless the authorization is terminated  or revoked sooner.       Influenza A by PCR NEGATIVE NEGATIVE Final   Influenza B by PCR NEGATIVE NEGATIVE Final    Comment: (NOTE) The Xpert Xpress SARS-CoV-2/FLU/RSV plus assay is intended as an aid in the diagnosis of influenza from Nasopharyngeal swab specimens and should not be used as a sole basis for treatment. Nasal washings and aspirates are unacceptable for Xpert Xpress SARS-CoV-2/FLU/RSV testing.  Fact Sheet for Patients: EntrepreneurPulse.com.au  Fact Sheet for Healthcare Providers: IncredibleEmployment.be  This test is not yet approved or cleared by the [place] FDA and has been authorized for detection and/or diagnosis of SARS-CoV-2 by FDA under an Emergency Use Authorization (EUA). This EUA will remain in effect (meaning this test can be used) for the duration of the COVID-19 declaration under Section 564(b)(1) of the Act, 21 U.S.C. section 360bbb-3(b)(1), unless the authorization is terminated or revoked.  Performed at [facility], [address], [place],  [address]      Time coordinating discharge: Over 30  minutes  SIGNED:   Wyvonnia Dusky, MD  [facility] 08/09/2021, 11:39 AM Pager   If 7PM-7AM, please contact night-coverage

## 2021-08-12 ENCOUNTER — Telehealth: Payer: Self-pay | Admitting: Family Medicine

## 2021-08-12 NOTE — Telephone Encounter (Signed)
I called and spoke with the patient and she stated that she is healing and doing well, patient stated she has a appointment with Neurosurgery on 08/29/2021, she stated she did not want a hospital follow up with the provider until after she sees them.  Dayyan Krist,cma

## 2021-08-12 NOTE — Telephone Encounter (Signed)
Noted  

## 2021-08-12 NOTE — Telephone Encounter (Signed)
This patient was recently hospitalized for a subdural hematoma.  Can you call her and see how she is doing after discharge?  It looks like they wanted her to follow-up with neurosurgery.  Can you find out if that has been arranged?  She could be scheduled for hospital follow-up with me on 08/19/2021 at 1 PM.

## 2021-08-15 ENCOUNTER — Telehealth: Payer: Self-pay

## 2021-08-15 NOTE — Telephone Encounter (Signed)
Transition Care Management Follow-up Telephone Call Date of discharge and from where: 08/09/2022,  [facility] How have you been since you were released from the hospital? " Doing well" Any questions or concerns? No  Items Reviewed: Did the pt receive and understand the discharge instructions provided? Yes  Medications obtained and verified? Yes  Other? No  Any new allergies since your discharge? No  Dietary orders reviewed? Yes Do you have support at home? Yes   Home Care and Equipment/Supplies: Were home health services ordered? Patient refused while inpatient If so, what is the name of the agency?  Has the agency set up a time to come to the patient's home?  Were any new equipment or medical supplies ordered?   What is the name of the medical supply agency?  Were you able to get the supplies/equipment?  Do you have any questions related to the use of the equipment or supplies?   Functional Questionnaire: (I = Independent and D = Dependent) ADLs: I  Bathing/Dressing- I  Meal Prep- I  Eating- I  Maintaining continence- I  Transferring/Ambulation- I  Managing Meds- I  Follow up appointments reviewed:  PCP Hospital f/u appt confirmed? No  PCP office called patient and she refused [facility] f/u appt confirmed? Yes  Scheduled to see Neurosurgery on 08/29/2021  Are transportation arrangements needed? No  Patient states family will drive patient If their condition worsens, is the pt aware to call PCP or go to the Emergency Dept.? Yes Was the patient provided with contact information for the PCP's office or ED? Yes Was to pt encouraged to call back with questions or concerns? Yes  Tomasa Rand, RN, BSN, CEN [facility] [employer] [phone]

## 2021-08-26 ENCOUNTER — Ambulatory Visit: Payer: Medicare Other | Attending: Family Medicine | Admitting: Physical Therapy

## 2021-08-26 DIAGNOSIS — M6281 Muscle weakness (generalized): Secondary | ICD-10-CM | POA: Diagnosis present

## 2021-08-26 DIAGNOSIS — M5442 Lumbago with sciatica, left side: Secondary | ICD-10-CM | POA: Insufficient documentation

## 2021-08-26 DIAGNOSIS — Z9181 History of falling: Secondary | ICD-10-CM | POA: Diagnosis present

## 2021-08-26 DIAGNOSIS — G8929 Other chronic pain: Secondary | ICD-10-CM | POA: Insufficient documentation

## 2021-08-26 DIAGNOSIS — M5441 Lumbago with sciatica, right side: Secondary | ICD-10-CM | POA: Diagnosis present

## 2021-08-26 NOTE — Therapy (Signed)
[facility] [address] [place], [address] Phone: [phone]   Fax:  [phone]  Physical Therapy Evaluation  Patient Details  Name: Jane Willis MRN: [id number] Date of Birth: [birth year] Referring Provider (PT): Allene Dillon, NP  Encounter Date: 08/26/2021   PT End of Session - 08/26/21 1851     Visit Number 1    Number of Visits 24    Date for PT Re-Evaluation 11/18/21    Authorization Type [employer] MEDICARE reporting period from 08/26/2021    Progress Note Due on Visit 10    PT Start Time 1600    PT Stop Time 1645    PT Time Calculation (min) 45 min    Activity Tolerance Patient tolerated treatment well    Behavior During Therapy [facility] for tasks assessed/performed;Anxious             Past Medical History:  Diagnosis Date   Allergy    Seasonal   Arthritis    Chicken pox    Hyperlipidemia    Hypertension    Urinary incontinence     Past Surgical History:  Procedure Laterality Date   ABDOMINAL HYSTERECTOMY  1986   BREAST EXCISIONAL BIOPSY Left 1970's   benign   BREAST SURGERY  1968   CATARACT EXTRACTION  2014/2015   Both eyes   FRACTURE SURGERY  01/2009   Fractured Left ankle, plate on outside of ankle 2 rods through/across ankle from inside of ankle   OOPHORECTOMY     TONSILLECTOMY AND ADENOIDECTOMY      There were no vitals filed for this visit.    Subjective Assessment - 08/26/21 1559     Subjective Patient states she is unsure when it started but at least months ago. It has just gradually gotten more and more intense. She does remember any injuries, accidents, or events that made it worse. Patient reports she has pain over her low back and buttocks. Has days that she has no pain (yesterday). Is usually worse in the morning. She likes walking. She states she got some relief from an injection recently (chart notes "bilateral S1 transforaminal ESI 07/18/2021 (80% relief)). She fell  recently (Oct. 18, 2022). She was getting ready to go to a funeral and she was carrying a crock pot to her vehicle. She was going down the steps and at the end of the steps she fell. She does not know why or how. She was hurt. She has an appointment coming up with the neurologist for her fall. She was stretched out on the garage floor when she was found. She was by herself when the fall happened. She got up and went in the house and was sick on her stomach but that improved over night. She was able to get up by herself, go in the house and change her clothes. She did not break anything. She fell on Tuesday but her family did not come until Wednesday after a text she sent did not seem right to her son. She states her head is "healing really well."  She was in the ED from Wed to Sat morning. The swelling is going down. Has not had any falls since then or felt like she would fall. States she has always been a bit "knock kneed."  [place] During objective exam patient reports the fall she had caused a brain bleed inside her head, a concussion, and it changed her hearing  and vision. She states she was hospitalized for several day  and was not recommended for HHPT but upon follow up with her doctors they recommended she come to outpatient PT which is why she is here. Chart Review: Hospital course from Dr. Jimmye Norman 10/20-10/22/22: Pt was found to have subdural hematoma after a mechanical fall at home. Pt was started on keppra for seizure prophylaxis x 1 week as per neuro surg. Neuro surg evaluated pt and recommended conservative management.    Pertinent History Patient is a 80 y.o. female who presents to outpatient physical therapy with a referral for medical diagnosis lumbar stenosis with neurogenic claudication, lumbar radiculitis, lumbar degenerative disc disease with note from referring clinician for stabilization, balance/falls. This patient's chief complaints consist of pain in  B low back, B buttocks, and posterior proximal thighs leading to the following functional deficits: slows her down with usual activities such as housework, prolonged standing, balance, stairs, getting up and down from the chair or in and out of the car..  Relevant past medical history and comorbidities include recent fall with subdural hematoma (08/05/2021), HTN, osteoporosis, L ankle fracture with ORIF (2010).  Patient denies hx of cancer, stroke, seizures, lung problem, major cardiac events, diabetes, unexplained weight loss, changes in bowel or bladder problems, new onset stumbling or dropping things, spinal surgeries.    Limitations House hold activities;Standing;Walking    Diagnostic tests MRI report from 07/09/2021: "Alignment: Dextrocurvature of the lumbar spine. 2 mm retrolisthesis  T12 on L1 and L1 on L2. Grade 1 anterolisthesis L4 on L5 and L5 on  S1."  "IMPRESSION:  1. L4-L5 severe spinal canal stenosis, with severe left and moderate  right neural foraminal narrowing.  2. L1-L2 mild spinal canal stenosis with severe right and mild left  neural foraminal narrowing.  3. L3-L4 and L5-S1 moderate right neural foraminal narrowing.  4. Effacement of the lateral recesses bilaterally at L4-L5 and on  the left at L2-L3 and L3-L4, which may affect the descending left L3  and L4 nerves and the descending bilateral L5 nerves.  5. 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    Patient Stated Goals would like to learn how to alleviate some of her pain, would like to have better pain control with less medication, get more stability in her trunk, (Feels like she is shrinking).    Currently in Pain? Yes    Pain Score 4    W: 10/10; B: 0/10   Pain Location Back   Mid low back to buttocks to back of legs, left seems worse. Sometimes goes down to lower legs.   Pain Orientation Right;Left     Pain Descriptors / Indicators Aching    Pain Type Chronic pain    Pain Radiating Towards down back of legs above knees.    Pain Onset More than a month ago    Pain Frequency Intermittent    Aggravating Factors  worse in morning, standing, going up stairs,    Pain Relieving Factors injection in late October, better as the day goes on, walking helps sometimes, sitting down for a little bit,    Effect of Pain on Daily Activities Functional Limitations: slows her down with usual activities such as housework, prolonged standing, balance, stairs, getting up and down from the chair or in and out of the car.  [facility] PT Assessment - 08/26/21 1609       Assessment   Medical Diagnosis lumbar stenosis with neurogenic claudication, lumbar radiculitis, lumbar degenerative disc disease    Referring Provider (PT) Allene Dillon, NP    Onset Date/Surgical Date --   at least months ago   Hand Dominance Right    Next MD Visit end of December    Prior Therapy no      Precautions   Precautions Other (comment)   not supposed to pick up anything more than 5 pounds or squat while she is healing from her recent fall. No timetable provided.     Balance Screen   Has the patient fallen in the past 6 months Yes    How many times? 1    Has the patient had a decrease in activity level because of a fear of falling?  No    Is the patient reluctant to leave their home because of a fear of falling?  No      Home Environment   Living Environment --   no concerns about getting around her home safely     Prior Function   Level of Independence Independent      Cognition   Overall Cognitive Status --   appears anxious; needs prolonged time to explain and respond to questions at times             OBJECTIVE  SELF- REPORTED FUNCTION FOTO score: 60/100 (lumbar spine questionnaire)  OBSERVATION/INSPECTION Posture Posture (seated): forward head, rounded shoulders, slumped in sitting.   Posture (standing): Bilateral genuvalgum, keeps feet apart, toe out noted bilaterally, mild spinal curve with right convexity, decreased lumbar lordosis. Slight left shift.  Anthropometrics Tremor: none Body composition: BMI 24.4 Muscle bulk: decreased throughout body Skin: old bruise noted on side of face Functional Mobility Bed mobility: supine <> sit and rolling I Transfers: sit <> stand I Gait: grossly WFL for household and short community ambulation except severe B genuvalgum and mild antalgic gait favoring L LE.   SPINE MOTION Lumbar Spine AROM *Indicates pain Flexion: MTPs to floor, feels good Extension: 25%, no increased pain  Side Flexion:   R 75%  L 100% Rotation:  R WFL L WFL  NEUROLOGICAL Dermatomes L2-S2 appears equal and intact to light touch   PERIPHERAL JOINT MOTION (in degrees) Passive Range of Motion (PROM) B LE appears WFL for basic mobility except posterolateral hip pain at end range ER at B hips L > R, concordant.   MUSCLE PERFORMANCE (MMT):  *Indicates pain 08/26/21 Date Date  Joint/Motion R/L R/L R/L  Hip     Flexion (L1, L2) 3+/3+ / /  Extension (knee ext) 4/4+ / /  Abduction 3+*/4* / /  Knee     Extension (L3) 5/5 / /  Flexion (S2) 4+/5 / /  Ankle/Foot     Dorsiflexion (L4) 5/4+ / /  Great toe extension (L5) 4+/4+ / /  Eversion (S1) 5/5 / /  Plantarflexion (S1) 5/4+ / /  Comments:  08/26/2021: able to heel and toe walk bilaterally (with low clearance).   SPECIAL TESTS:  LOWER LIMB NEURODYNAMIC Straight Leg Raise (Sciatic nerve)  R  = positive for concordant pain at end range (> 90 degrees hip flexion).   L  = positive for concordant pain at end rang e( > 90 degrees hip flexion)  HIP SPECIAL TESTS FABER: R = positive for concordant pain at posteriorlateral hips bilaterally, L = positive for concordant pain at posterolateral  L hip ER derotation test: R = negative; L = negative.   ACCESSORY MOTION:  Concordant pain with CPA to sacrum,  not lumbar spine  PALPATION: TTP grade I, at bilateral greater trochanters and glute med region.   FUNCTIONAL/BALANCE TESTS: Five Time Sit to Stand (5TSTS): 13.45 seconds, no UE support from 18.5 inch plinth.   Objective measurements completed on examination: See above findings.     TREATMENT:  +osteoporosis +recent subdural hematoma (08/05/21) Precautions from referring clinician: no squats or lifting over 5#  Therapeutic exercise: to centralize symptoms and improve ROM, strength, muscular endurance, and activity tolerance required for successful completion of functional activities.  - standing hip abduction 1x10 on left LE with trial of diagonal back direction. Discontinued due to increased concordant pain.  - Education on diagnosis, prognosis, POC, anatomy and physiology of current condition.   Pt required multimodal cuing for proper technique and to facilitate improved neuromuscular control, strength, range of motion, and functional ability resulting in improved performance and form.   PT Education - 08/26/21 1850     Education Details Exercise purpose/form. Self management techniques.  Education on diagnosis, prognosis, POC, anatomy and physiology of current condition    Person(s) Educated Patient    Methods Explanation;Demonstration;Tactile cues;Verbal cues    Comprehension Verbalized understanding;Returned demonstration;Need further instruction              PT Short Term Goals - 08/26/21 1852       PT SHORT TERM GOAL #1   Title Be independent with initial home exercise program for self-management of symptoms.    Baseline Initial HEP to be provided at visit 2 as appropriate (08/26/2021);    Time 2    Period Weeks    Status New    Target Date 09/09/21               PT Long Term Goals - 08/26/21 1853       PT LONG TERM GOAL #1   Title Be independent with a long-term home exercise program for self-management of symptoms.    Baseline Initial HEP to be  provided at visit 2 as appropriate (08/26/2021);    Time 12    Period Weeks    Status New   TARGET DATE FOR ALL LONG TERM GOALS: 11/18/2021     PT LONG TERM GOAL #2   Title Demonstrate improved FOTO to equal or greater than 69 by visit #10 to demonstrate improvement in overall condition and self-reported functional ability.    Baseline 60 (08/26/2021);    Time 12    Period Weeks    Status New      PT LONG TERM GOAL #3   Title Reduce pain with functional activities to equal or less than 1/10 to allow patient to complete usual activities including housework, stairs, standing  with less difficulty.    Baseline up to 10/10 (08/26/2021);    Time 12    Period Weeks    Status New      PT LONG TERM GOAL #4   Title Patient will ambulate equal or greater than 1000 feet during 6 Minute Walk Test to demonstrate improved community mobilty.    Baseline to be tested visit 2 (08/26/2021);    Time 12    Period Weeks    Status New      PT LONG TERM GOAL #5   Title Improve B hip strength to equal or greater than 4+/5 with no increase in pain to improve patient's ability  to complete functional tasks such as hosuework, stairs, community activity  with less difficulty.    Baseline as low as 3+/5 and painful - see objective exam (08/26/2021);    Time 12    Period Weeks    Status New      Additional Long Term Goals   Additional Long Term Goals Yes      PT LONG TERM GOAL #6   Title Patient will score equal or greater than 25/30 on Functional Gait Assessment to demonstrate low fall risk.    Baseline To be tested visit 2 as appropriate (08/26/2021);    Time 12    Period Weeks    Status New                    Plan - 08/26/21 1910     Clinical Impression Statement Patient is a 80 y.o. female referred to outpatient physical therapy with a medical diagnosis of lumbar stenosis with neurogenic claudication, lumbar radiculitis, lumbar degenerative disc disease with note from referring clinician for  stabilization, balance/falls who presents with signs and symptoms consistent with low back pain with bilateral radiation above the knee consistent with possible symptomatic lumbar stenosis, generalized weakness in the lumbopelvic and hip region especially, and history of fall resulting in subdural hematoma. Patient demonstrates good range of motion of the lumbar spine and B LE, but has significant weakness in the hips and core. She had a good for her age 89 Time Sit To Stand score of under 15 seconds but would benefit from further balance testing at subsequent visits due to recent fall and head injury. Patient presents with significant pain, joint stiffness, motor control, posture, fall history, muscle performance (strength/power/endurance) and activity tolerance impairments that are limiting ability to complete her usual activities such as housework, prolonged standing, balance, stairs, getting up and down from the chair or in and out of the car without difficulty and decreases her quality of life. Patient will benefit from skilled physical therapy intervention to address current body structure impairments and activity limitations to improve function and work towards goals set in current POC in order to return to prior level of function or maximal functional improvement.    Personal Factors and Comorbidities Age;Past/Current Experience    Examination-Activity Limitations Squat;Stairs;Lift;Stand;Carry    Examination-Participation Restrictions Interpersonal Relationship;Meal Prep;Laundry;Cleaning;Shop;Community Activity   slows her down with usual activities such as housework, prolonged standing, balance, stairs, getting up and down from the chair or in and out of the car.   Stability/Clinical Decision Making Stable/Uncomplicated    Clinical Decision Making Low    Rehab Potential Good    PT Frequency 2x / week    PT Duration 12 weeks    PT Treatment/Interventions ADLs/Self Care Home  Management;Cryotherapy;Electrical Stimulation;Moist Heat;Gait training;Functional mobility training;Therapeutic activities;Therapeutic exercise;Balance training;Neuromuscular re-education;Manual techniques;Dry needling;Passive range of motion;Patient/family education;Energy conservation    PT Next Visit Plan 6MWT, FGA, establish HEP, core/hip/functional strength    PT Home Exercise Plan TBD    Consulted and Agree with Plan of Care Patient             Patient will benefit from skilled therapeutic intervention in order to improve the following deficits and impairments:  Abnormal gait, Decreased cognition, Pain, Postural dysfunction, Decreased mobility, Decreased activity tolerance, Decreased endurance, Decreased strength, Impaired perceived functional ability, Decreased balance, Difficulty walking, Other (comment) (hostory of fall)  Visit Diagnosis: Chronic bilateral low back pain with bilateral sciatica - Plan: PT plan of care cert/re-cert  History  of falling - Plan: PT plan of care cert/re-cert  Muscle weakness (generalized) - Plan: PT plan of care cert/re-cert     Problem List Patient Active Problem List   Diagnosis Date Noted   Subdural hematoma 08/06/2021   History of COVID-19 03/01/2020   Prediabetes 07/06/2018   Age-related osteoporosis without current pathological fracture 12/29/2017   B12 deficiency 12/29/2017   Subcutaneous nodule 06/29/2017   Allergic rhinitis 03/19/2017   Family history of colon cancer 02/11/2017   Hypertension 12/31/2016   Hyperlipidemia 07/03/2016   History of low impact fracture of ankle 07/03/2016   Bilateral finger numbness 04/02/2016    Everlean Alstrom. Graylon Good, PT, DPT 08/26/21, 7:15 PM   [facility] [address] [place], [address] Phone: [phone]   Fax:  [phone]  Name: Jane Willis MRN: [id number] Date of Birth: February 03, 1941

## 2021-08-28 ENCOUNTER — Ambulatory Visit: Payer: Medicare Other | Admitting: Physical Therapy

## 2021-08-28 DIAGNOSIS — M6281 Muscle weakness (generalized): Secondary | ICD-10-CM

## 2021-08-28 DIAGNOSIS — G8929 Other chronic pain: Secondary | ICD-10-CM

## 2021-08-28 DIAGNOSIS — Z9181 History of falling: Secondary | ICD-10-CM

## 2021-08-28 DIAGNOSIS — M5442 Lumbago with sciatica, left side: Secondary | ICD-10-CM | POA: Diagnosis not present

## 2021-08-28 NOTE — Therapy (Signed)
[facility] [address], [place], [address] Phone: [phone]   Fax:  [phone]  Physical Therapy Treatment  Patient Details  Name: Jane Willis MRN: [id number] Date of Birth: 1941/05/05 Referring Provider (PT): Allene Dillon, NP   Encounter Date: 08/28/2021   PT End of Session - 08/28/21 1616     Visit Number 2    Number of Visits 24    Date for PT Re-Evaluation 11/18/21    Authorization Type [employer] MEDICARE reporting period from 08/26/2021    Progress Note Due on Visit 10    PT Start Time 1610    PT Stop Time 1655    PT Time Calculation (min) 45 min    Activity Tolerance Patient tolerated treatment well    Behavior During Therapy [facility] for tasks assessed/performed             Past Medical History:  Diagnosis Date   Allergy    Seasonal   Arthritis    Chicken pox    Hyperlipidemia    Hypertension    Urinary incontinence     Past Surgical History:  Procedure Laterality Date   ABDOMINAL HYSTERECTOMY  1986   BREAST EXCISIONAL BIOPSY Left 1970's   benign   BREAST SURGERY  1968   CATARACT EXTRACTION  2014/2015   Both eyes   FRACTURE SURGERY  01/2009   Fractured Left ankle, plate on outside of ankle 2 rods through/across ankle from inside of ankle   OOPHORECTOMY     TONSILLECTOMY AND ADENOIDECTOMY      There were no vitals filed for this visit.   Subjective Assessment - 08/28/21 1612     Subjective Pateint reports her pain is 3-4/10 in her low back and buttocks upon arrival. 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 She was slightly sore after last session. No falls since last treatment session.    Pertinent History Patient is a 80 y.o. female who presents to outpatient physical therapy with a referral for medical diagnosis lumbar stenosis with neurogenic claudication, lumbar radiculitis, lumbar degenerative disc disease with note from  referring clinician for stabilization, balance/falls. This patient's chief complaints consist of pain in B low back, B buttocks, and posterior proximal thighs leading to the following functional deficits: slows her down with usual activities such as housework, prolonged standing, balance, stairs, getting up and down from the chair or in and out of the car..  Relevant past medical history and comorbidities include recent fall with subdural hematoma (08/05/2021), HTN, osteoporosis, L ankle fracture with ORIF (2010).  Patient denies hx of cancer, stroke, seizures, lung problem, major cardiac events, diabetes, unexplained weight loss, changes in bowel or bladder problems, new onset stumbling or dropping things, spinal surgeries.    Limitations House hold activities;Standing;Walking    Diagnostic tests MRI report from 07/09/2021: "Alignment: Dextrocurvature of the lumbar spine. 2 mm retrolisthesis  T12 on L1 and L1 on L2. Grade 1 anterolisthesis L4 on L5 and L5 on  S1."  "IMPRESSION:  1. L4-L5 severe spinal canal stenosis, with severe left and moderate  right neural foraminal narrowing.  2. L1-L2 mild spinal canal stenosis with severe right and mild left  neural foraminal narrowing.  3. L3-L4 and L5-S1 moderate right neural foraminal narrowing.  4. Effacement of the lateral recesses bilaterally at L4-L5 and on  the left at L2-L3 and L3-L4, which may affect the descending left L3  and  L4 nerves and the descending bilateral L5 nerves.  5. 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    Patient Stated Goals would like to learn how to alleviate some of her pain, would like to have better pain control with less medication, get more stability in her trunk, (Feels like she is shrinking).    Currently in Pain? Yes    Pain Score 4     Pain Location Back    Pain Onset More than a month ago                  [facility] PT Assessment - 08/28/21 1623       Assessment   Medical Diagnosis lumbar stenosis with neurogenic claudication, lumbar radiculitis, lumbar degenerative disc disease    Referring Provider (PT) Allene Dillon, NP    Onset Date/Surgical Date --   at least months ago   Hand Dominance Right    Next MD Visit end of December    Prior Therapy no      Precautions   Precautions Other (comment)   not supposed to pick up anything more than 5 pounds or squat while she is healing from her recent fall. No timetable provided.     Balance Screen   Has the patient fallen in the past 6 months Yes    How many times? 1    Has the patient had a decrease in activity level because of a fear of falling?  No    Is the patient reluctant to leave their home because of a fear of falling?  No      Home Environment   Living Environment --   no concerns about getting around her home safely     Prior Function   Level of Independence Independent      Cognition   Overall Cognitive Status --   appears anxious; needs prolonged time to explain and respond to questions at times     Functional Gait  Assessment   Gait assessed  Yes    Gait Level Surface Walks 20 ft in less than 5.5 sec, no assistive devices, good speed, no evidence for imbalance, normal gait pattern, deviates no more than 6 in outside of the 12 in walkway width.   4.66 seconds   Change in Gait Speed Able to smoothly change walking speed without loss of balance or gait deviation. Deviate no more than 6 in outside of the 12 in walkway width.    Gait with Horizontal Head Turns Performs head turns smoothly with slight change in gait velocity (eg, minor disruption to smooth gait path), deviates 6-10 in outside 12 in walkway width, or uses an assistive device.    Gait with Vertical Head Turns Performs head turns with no change in gait. Deviates no more than 6 in outside 12 in walkway width.    Gait and Pivot Turn Pivot turns safely in  greater than 3 sec and stops with no loss of balance, or pivot turns safely within 3 sec and stops with mild imbalance, requires small steps to catch balance.    Step Over Obstacle Is able to step over 2 stacked shoe boxes taped together (9 in total height) without changing gait speed. No evidence of imbalance.    Gait with Narrow Base of Support Ambulates less than 4 steps heel to toe or cannot perform  without assistance.   high level of genuvalgus complicates ability to get feet in line   Gait with Eyes Closed Cannot walk 20 ft without assistance, severe gait deviations or imbalance, deviates greater than 15 in outside 12 in walkway width or will not attempt task.   veers slighly to right in 2nd 10 feet   Ambulating Backwards Walks 20 ft, uses assistive device, slower speed, mild gait deviations, deviates 6-10 in outside 12 in walkway width.    Steps Alternating feet, must use rail.    Total Score 20             OBJECTIVE  FUNCTIONAL/BALANCE TESTS 6 Minute Walk Test: 1160 feet no assistive device. Pain moved out of lower buttocks.   Functional Gait Assessment: 20/30 medium fall risk 10 Meter Walk Trial: Self-selected speed: 0.87 m/s; fast speed: 1.29 m/s.  TREATMENT:  +osteoporosis +recent subdural hematoma (08/05/21) Precautions from referring clinician: no squats or lifting over 5#   Therapeutic exercise: to centralize symptoms and improve ROM, strength, muscular endurance, and activity tolerance required for successful completion of functional activities.  - ambulation around room for distance in 6 minutes (see above).   - standing lumbar extension over table, 2x10, decreasing sensitivity at the glute.  - Functional Gait Analysis to assess fall risk (see above).  (Noted for resolution of pain after standing activities).  - hooklying low trunk rotation 1x20 each side.  - hooklying pelvic tilt AROM, 1x20 each direction.  - hooklying posterior pelvic tilt with breath, 2x10 (reports  feeling pain in left ankle at end of first set).  - Education on HEP including handout   Pt required multimodal cuing for proper technique and to facilitate improved neuromuscular control, strength, range of motion, and functional ability resulting in improved performance and form.  HOME EXERCISE PROGRAM Access Code: [email or web address] URL: [email or web address] Date: 08/28/2021 Prepared by: Rosita Kea  Exercises Supine Lower Trunk Rotation - 1 x daily - 1 sets - 20 reps Supine Pelvic Tilt - 1 x daily - 1 sets - 20 reps Supine Posterior Pelvic Tilt - 1 x daily - 1 sets - 20 reps - 1 breath/5 seconds hold    PT Education - 08/28/21 1617     Education Details Exercise purpose/form. Self management techniques.    Person(s) Educated Patient    Methods Explanation;Demonstration;Tactile cues;Verbal cues;Handout    Comprehension Verbalized understanding;Returned demonstration;Verbal cues required;Tactile cues required;Need further instruction              PT Short Term Goals - 08/26/21 1852       PT SHORT TERM GOAL #1   Title Be independent with initial home exercise program for self-management of symptoms.    Baseline Initial HEP to be provided at visit 2 as appropriate (08/26/2021);    Time 2    Period Weeks    Status New    Target Date 09/09/21               PT Long Term Goals - 08/26/21 1853       PT LONG TERM GOAL #1   Title Be independent with a long-term home exercise program for self-management of symptoms.    Baseline Initial HEP to be provided at visit 2 as appropriate (08/26/2021);    Time 12    Period Weeks    Status New   TARGET DATE FOR ALL LONG TERM GOALS: 11/18/2021     PT LONG TERM GOAL #2   Title Demonstrate improved FOTO to  equal or greater than 69 by visit #10 to demonstrate improvement in overall condition and self-reported functional ability.    Baseline 60 (08/26/2021);    Time 12    Period Weeks    Status New      PT LONG TERM GOAL #3    Title Reduce pain with functional activities to equal or less than 1/10 to allow patient to complete usual activities including housework, stairs, standing  with less difficulty.    Baseline up to 10/10 (08/26/2021);    Time 12    Period Weeks    Status New      PT LONG TERM GOAL #4   Title Patient will ambulate equal or greater than 1000 feet during 6 Minute Walk Test to demonstrate improved community mobilty.    Baseline to be tested visit 2 (08/26/2021);    Time 12    Period Weeks    Status New      PT LONG TERM GOAL #5   Title Improve B hip strength to equal or greater than 4+/5 with no increase in pain to improve patient's ability to complete functional tasks such as hosuework, stairs, community activity  with less difficulty.    Baseline as low as 3+/5 and painful - see objective exam (08/26/2021);    Time 12    Period Weeks    Status New      Additional Long Term Goals   Additional Long Term Goals Yes      PT LONG TERM GOAL #6   Title Patient will score equal or greater than 25/30 on Functional Gait Assessment to demonstrate low fall risk.    Baseline To be tested visit 2 as appropriate (08/26/2021);    Time 12    Period Weeks    Status New                   Plan - 08/28/21 1842     Clinical Impression Statement Patient tolerated treatment with some difficulty due to concordant pain in the glutes and low back by the end of supine exercises. She reported relief of pain after standing exercises but felt better with posterior pelvic tilt than anterior pelvic tilt in the moment. Provided initial HEP to start working on these at home. Patient continues to walk with mildly stooped posture. Patient would benefit from continued management of limiting condition by skilled physical therapist to address remaining impairments and functional limitations to work towards stated goals and return to PLOF or maximal functional independence.    Personal Factors and Comorbidities  Age;Past/Current Experience    Examination-Activity Limitations Squat;Stairs;Lift;Stand;Carry    Examination-Participation Restrictions Interpersonal Relationship;Meal Prep;Laundry;Cleaning;Shop;Community Activity   slows her down with usual activities such as housework, prolonged standing, balance, stairs, getting up and down from the chair or in and out of the car.   Stability/Clinical Decision Making Stable/Uncomplicated    Rehab Potential Good    PT Frequency 2x / week    PT Duration 12 weeks    PT Treatment/Interventions ADLs/Self Care Home Management;Cryotherapy;Electrical Stimulation;Moist Heat;Gait training;Functional mobility training;Therapeutic activities;Therapeutic exercise;Balance training;Neuromuscular re-education;Manual techniques;Dry needling;Passive range of motion;Patient/family education;Energy conservation    PT Next Visit Plan core/hip/functional strength    PT Home Exercise Plan Medbridge Access Code: [email or web address]    Consulted and Agree with Plan of Care Patient             Patient will benefit from skilled therapeutic intervention in order to improve the following deficits and impairments:  Abnormal gait,  Decreased cognition, Pain, Postural dysfunction, Decreased mobility, Decreased activity tolerance, Decreased endurance, Decreased strength, Impaired perceived functional ability, Decreased balance, Difficulty walking, Other (comment) (hostory of fall)  Visit Diagnosis: Chronic bilateral low back pain with bilateral sciatica  History of falling  Muscle weakness (generalized)     Problem List Patient Active Problem List   Diagnosis Date Noted   Subdural hematoma 08/06/2021   History of COVID-19 03/01/2020   Prediabetes 07/06/2018   Age-related osteoporosis without current pathological fracture 12/29/2017   B12 deficiency 12/29/2017   Subcutaneous nodule 06/29/2017   Allergic rhinitis 03/19/2017   Family history of colon cancer 02/11/2017   Hypertension  12/31/2016   Hyperlipidemia 07/03/2016   History of low impact fracture of ankle 07/03/2016   Bilateral finger numbness 04/02/2016    Everlean Alstrom. Graylon Good, PT, DPT 08/28/21, 6:44 PM   [facility] [address] [place], [address] Phone: [phone]   Fax:  [phone]  Name: BRADI ARBUTHNOT MRN: [id number] Date of Birth: [birth year]

## 2021-09-02 ENCOUNTER — Ambulatory Visit: Payer: Medicare Other | Admitting: Physical Therapy

## 2021-09-02 ENCOUNTER — Encounter: Payer: Self-pay | Admitting: Physical Therapy

## 2021-09-02 ENCOUNTER — Encounter: Payer: Medicare Other | Admitting: Physical Therapy

## 2021-09-02 DIAGNOSIS — Z9181 History of falling: Secondary | ICD-10-CM

## 2021-09-02 DIAGNOSIS — G8929 Other chronic pain: Secondary | ICD-10-CM

## 2021-09-02 DIAGNOSIS — M5442 Lumbago with sciatica, left side: Secondary | ICD-10-CM | POA: Diagnosis not present

## 2021-09-02 DIAGNOSIS — M6281 Muscle weakness (generalized): Secondary | ICD-10-CM

## 2021-09-02 NOTE — Therapy (Signed)
[facility] [address], [place], [address] Phone: [phone]   Fax:  [phone]  Physical Therapy Treatment  Patient Details  Name: Jane Willis MRN: [id number] Date of Birth: 1941-06-12 Referring Provider (PT): Allene Dillon, NP   Encounter Date: 09/02/2021   PT End of Session - 09/02/21 1356     Visit Number 3    Number of Visits 24    Date for PT Re-Evaluation 11/18/21    Authorization Type [employer] MEDICARE reporting period from 08/26/2021    Progress Note Due on Visit 10    PT Start Time 1345    PT Stop Time 1425    PT Time Calculation (min) 40 min    Activity Tolerance Patient tolerated treatment well    Behavior During Therapy [facility] for tasks assessed/performed             Past Medical History:  Diagnosis Date   Allergy    Seasonal   Arthritis    Chicken pox    Hyperlipidemia    Hypertension    Urinary incontinence     Past Surgical History:  Procedure Laterality Date   ABDOMINAL HYSTERECTOMY  1986   BREAST EXCISIONAL BIOPSY Left 1970's   benign   BREAST SURGERY  1968   CATARACT EXTRACTION  2014/2015   Both eyes   FRACTURE SURGERY  01/2009   Fractured Left ankle, plate on outside of ankle 2 rods through/across ankle from inside of ankle   OOPHORECTOMY     TONSILLECTOMY AND ADENOIDECTOMY      There were no vitals filed for this visit.   Subjective Assessment - 09/02/21 1347     Subjective Patient reports her right buttocks is better but the left is the same. She rates the pain there as 3-4/10. She states it has  only been better today. She took a tylenol after last PT session. She recently saw neurology for a follow up for her head injury. She does not need to go back again unless she needs it. States she can now drive if she feels comfortable doing it. Patient does not remember what precautions she was given. Upon questioning, patient reports she is not supposed to lift over  10#, stoop, or squat. She expects to see Whitney Meeler at the end of December for a follow up for her back. Patient struggled with thought organization and memory during subjective but seemed more clear with further discussion. She has been doing her HEP each day but she is not sure if they are helping her and she wants to be sure she is doing them right.    Pertinent History Patient is a 80 y.o. female who presents to outpatient physical therapy with a referral for medical diagnosis lumbar stenosis with neurogenic claudication, lumbar radiculitis, lumbar degenerative disc disease with note from referring clinician for stabilization, balance/falls. This patient's chief complaints consist of pain in B low back, B buttocks, and posterior proximal thighs leading to the following functional deficits: slows her down with usual activities such as housework, prolonged standing, balance, stairs, getting up and down from the chair or in and out of the car..  Relevant past medical history and comorbidities include recent fall with subdural hematoma (08/05/2021), HTN, osteoporosis, L ankle fracture with ORIF (2010).  Patient denies hx of cancer, stroke, seizures, lung problem, major cardiac events, diabetes, unexplained weight loss, changes in bowel or bladder problems, new onset stumbling or dropping things, spinal surgeries.  Limitations House hold activities;Standing;Walking    Diagnostic tests MRI report from 07/09/2021: "Alignment: Dextrocurvature of the lumbar spine. 2 mm retrolisthesis  T12 on L1 and L1 on L2. Grade 1 anterolisthesis L4 on L5 and L5 on  S1."  "IMPRESSION:  1. L4-L5 severe spinal canal stenosis, with severe left and moderate  right neural foraminal narrowing.  2. L1-L2 mild spinal canal stenosis with severe right and mild left  neural foraminal narrowing.  3. L3-L4 and L5-S1 moderate right neural foraminal narrowing.  4. Effacement of the lateral recesses bilaterally at L4-L5 and on  the left at  L2-L3 and L3-L4, which may affect the descending left L3  and L4 nerves and the descending bilateral L5 nerves.  5. 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    Patient Stated Goals would like to learn how to alleviate some of her pain, would like to have better pain control with less medication, get more stability in her trunk, (Feels like she is shrinking).    Currently in Pain? Yes    Pain Score 3     Pain Onset More than a month ago               TREATMENT:  +osteoporosis +recent subdural hematoma from contrecoup injury (08/05/21) Precautions from neurology per pt: no squats, stoops, or lifting over 10#   Therapeutic exercise: to centralize symptoms and improve ROM, strength, muscular endurance, and activity tolerance required for successful completion of functional activities.  - hooklying low trunk rotation 1x20 each side (states this one is relaxing, good form) - hooklying pelvic tilt AROM, 1x15 each direction. (Reports posterior feels fine and anterior makes her feel pressure at the glutes and top of posterior thighs, L > R, and does get better with more reps, good form, reports L ankle pain at end of exercise).  - hooklying posterior pelvic tilt with breath, 1x20 (reports feels okay to do tilt and mild pain when releasing tilt, needed cuing to do posterior pelvic tilt instead of anterior pelvic tilt., L ankle no longer hurting at end of set).  - seated sciatic nerve glide, slider in slump position, 1x15 each side (well tolerated).  - standing diagonal hip abduction/extension, up to 5 second hold, 3x10 each side - standing scapular row, 2x10 with 5# cable - Education on HEP including handout     Pt required multimodal cuing for proper technique and to facilitate improved neuromuscular control, strength, range of motion, and functional ability  resulting in improved performance and form.   HOME EXERCISE PROGRAM Access Code: [email or web address] URL: [email or web address] Date: 09/02/2021 Prepared by: Rosita Kea  Exercises Supine Lower Trunk Rotation - 1 x daily - 1 sets - 20 reps Supine Posterior Pelvic Tilt - 1 x daily - 1 sets - 20 reps - 1 breath/5 seconds hold Seated Slump Nerve Glide - 2 x daily - 1 sets - 15 reps Diagonal Hip Extension - 1 x daily - 2 sets - 10 reps - 5 seconds hold     PT Education - 09/02/21 1353     Education Details Exercise purpose/form. Self management techniques.    Person(s) Educated Patient    Methods Explanation;Demonstration;Tactile cues;Verbal cues    Comprehension Verbalized understanding;Returned demonstration;Verbal cues required;Tactile cues required;Need further instruction              PT Short Term Goals -  08/26/21 1852       PT SHORT TERM GOAL #1   Title Be independent with initial home exercise program for self-management of symptoms.    Baseline Initial HEP to be provided at visit 2 as appropriate (08/26/2021);    Time 2    Period Weeks    Status New    Target Date 09/09/21               PT Long Term Goals - 08/26/21 1853       PT LONG TERM GOAL #1   Title Be independent with a long-term home exercise program for self-management of symptoms.    Baseline Initial HEP to be provided at visit 2 as appropriate (08/26/2021);    Time 12    Period Weeks    Status New   TARGET DATE FOR ALL LONG TERM GOALS: 11/18/2021     PT LONG TERM GOAL #2   Title Demonstrate improved FOTO to equal or greater than 69 by visit #10 to demonstrate improvement in overall condition and self-reported functional ability.    Baseline 60 (08/26/2021);    Time 12    Period Weeks    Status New      PT LONG TERM GOAL #3   Title Reduce pain with functional activities to equal or less than 1/10 to allow patient to complete usual activities including housework, stairs, standing  with  less difficulty.    Baseline up to 10/10 (08/26/2021);    Time 12    Period Weeks    Status New      PT LONG TERM GOAL #4   Title Patient will ambulate equal or greater than 1000 feet during 6 Minute Walk Test to demonstrate improved community mobilty.    Baseline to be tested visit 2 (08/26/2021);    Time 12    Period Weeks    Status New      PT LONG TERM GOAL #5   Title Improve B hip strength to equal or greater than 4+/5 with no increase in pain to improve patient's ability to complete functional tasks such as hosuework, stairs, community activity  with less difficulty.    Baseline as low as 3+/5 and painful - see objective exam (08/26/2021);    Time 12    Period Weeks    Status New      Additional Long Term Goals   Additional Long Term Goals Yes      PT LONG TERM GOAL #6   Title Patient will score equal or greater than 25/30 on Functional Gait Assessment to demonstrate low fall risk.    Baseline To be tested visit 2 as appropriate (08/26/2021);    Time 12    Period Weeks    Status New                   Plan - 09/02/21 1431     Clinical Impression Statement Patient tolerated treatment well overall with some difficulty due to continued pain in the L > R low back and buttocks region. Updated HEP to include nerve glides and lateral/posterior hip strengthening exercises. Patient did have some significant difficulty differentiating between her visits with Whitney Meeler and Cooper Render but was able to distinguish with further discussion, time, and thought. She also knew exactly when her upcoming PT appointments were in the next two weeks and that she needed to change one. Patient continues to have low back and buttock pain that impairs her functional mobility. Kept  load low and encouraged breathing to minimize increase in intracranial pressure. Patient would benefit from continued management of limiting condition by skilled physical therapist to address remaining impairments  and functional limitations to work towards stated goals and return to PLOF or maximal functional independence.    Personal Factors and Comorbidities Age;Past/Current Experience    Examination-Activity Limitations Squat;Stairs;Lift;Stand;Carry    Examination-Participation Restrictions Interpersonal Relationship;Meal Prep;Laundry;Cleaning;Shop;Community Activity   slows her down with usual activities such as housework, prolonged standing, balance, stairs, getting up and down from the chair or in and out of the car.   Stability/Clinical Decision Making Stable/Uncomplicated    Rehab Potential Good    PT Frequency 2x / week    PT Duration 12 weeks    PT Treatment/Interventions ADLs/Self Care Home Management;Cryotherapy;Electrical Stimulation;Moist Heat;Gait training;Functional mobility training;Therapeutic activities;Therapeutic exercise;Balance training;Neuromuscular re-education;Manual techniques;Dry needling;Passive range of motion;Patient/family education;Energy conservation    PT Next Visit Plan core/hip/functional strength    PT Home Exercise Plan Medbridge Access Code: [email or web address]    Consulted and Agree with Plan of Care Patient             Patient will benefit from skilled therapeutic intervention in order to improve the following deficits and impairments:  Abnormal gait, Decreased cognition, Pain, Postural dysfunction, Decreased mobility, Decreased activity tolerance, Decreased endurance, Decreased strength, Impaired perceived functional ability, Decreased balance, Difficulty walking, Other (comment) (hostory of fall)  Visit Diagnosis: Chronic bilateral low back pain with bilateral sciatica  History of falling  Muscle weakness (generalized)     Problem List Patient Active Problem List   Diagnosis Date Noted   Subdural hematoma 08/06/2021   History of COVID-19 03/01/2020   Prediabetes 07/06/2018   Age-related osteoporosis without current pathological fracture 12/29/2017   B12  deficiency 12/29/2017   Subcutaneous nodule 06/29/2017   Allergic rhinitis 03/19/2017   Family history of colon cancer 02/11/2017   Hypertension 12/31/2016   Hyperlipidemia 07/03/2016   History of low impact fracture of ankle 07/03/2016   Bilateral finger numbness 04/02/2016    Everlean Alstrom. Graylon Good, PT, DPT 09/02/21, 2:31 PM   [facility] [address], [place], [address] Phone: [phone]   Fax:  [phone]  Name: Jane Willis MRN: [id number] Date of Birth: 12/17/1940

## 2021-09-03 ENCOUNTER — Encounter: Payer: Medicare Other | Admitting: Physical Therapy

## 2021-09-09 ENCOUNTER — Encounter: Payer: Medicare Other | Admitting: Physical Therapy

## 2021-09-10 ENCOUNTER — Ambulatory Visit: Payer: Medicare Other | Admitting: Physical Therapy

## 2021-09-10 ENCOUNTER — Encounter: Payer: Self-pay | Admitting: Physical Therapy

## 2021-09-10 DIAGNOSIS — M5442 Lumbago with sciatica, left side: Secondary | ICD-10-CM

## 2021-09-10 DIAGNOSIS — Z9181 History of falling: Secondary | ICD-10-CM

## 2021-09-10 DIAGNOSIS — M6281 Muscle weakness (generalized): Secondary | ICD-10-CM

## 2021-09-10 DIAGNOSIS — G8929 Other chronic pain: Secondary | ICD-10-CM

## 2021-09-10 NOTE — Therapy (Signed)
[facility] [address], [place], [address] Phone: [phone]   Fax:  [phone]  Physical Therapy Treatment  Patient Details  Name: Jane Willis MRN: [id number] Date of Birth: 09/04/1941 Referring Provider (PT): Allene Dillon, NP   Encounter Date: 09/10/2021   PT End of Session - 09/10/21 1011     Visit Number 4    Number of Visits 24    Date for PT Re-Evaluation 11/18/21    Authorization Type [employer] MEDICARE reporting period from 08/26/2021    Progress Note Due on Visit 10    PT Start Time 0905    PT Stop Time 0945    PT Time Calculation (min) 40 min    Behavior During Therapy [facility] for tasks assessed/performed             Past Medical History:  Diagnosis Date   Allergy    Seasonal   Arthritis    Chicken pox    Hyperlipidemia    Hypertension    Urinary incontinence     Past Surgical History:  Procedure Laterality Date   ABDOMINAL HYSTERECTOMY  1986   BREAST EXCISIONAL BIOPSY Left 1970's   benign   BREAST SURGERY  1968   CATARACT EXTRACTION  2014/2015   Both eyes   FRACTURE SURGERY  01/2009   Fractured Left ankle, plate on outside of ankle 2 rods through/across ankle from inside of ankle   OOPHORECTOMY     TONSILLECTOMY AND ADENOIDECTOMY      There were no vitals filed for this visit.   Subjective Assessment - 09/10/21 0910     Subjective Patinet reports she feels well today with no headache. She states she has some pain in her B posterior thighs, L > R, up to 4/10. States it hurt when she got up and it felt better after sititng in her dining room chair and doing some exercises. States she continued to have some pain after last PT session. She arrives with no AD and reports no falls since last PT session. She went to visit family in [place], [place] last week and had a nice time.    Pertinent History Patient is a 80 y.o. female who presents to outpatient physical therapy with a  referral for medical diagnosis lumbar stenosis with neurogenic claudication, lumbar radiculitis, lumbar degenerative disc disease with note from referring clinician for stabilization, balance/falls. This patient's chief complaints consist of pain in B low back, B buttocks, and posterior proximal thighs leading to the following functional deficits: slows her down with usual activities such as housework, prolonged standing, balance, stairs, getting up and down from the chair or in and out of the car..  Relevant past medical history and comorbidities include recent fall with subdural hematoma (08/05/2021), HTN, osteoporosis, L ankle fracture with ORIF (2010).  Patient denies hx of cancer, stroke, seizures, lung problem, major cardiac events, diabetes, unexplained weight loss, changes in bowel or bladder problems, new onset stumbling or dropping things, spinal surgeries.    Limitations House hold activities;Standing;Walking    Diagnostic tests MRI report from 07/09/2021: "Alignment: Dextrocurvature of the lumbar spine. 2 mm retrolisthesis  T12 on L1 and L1 on L2. Grade 1 anterolisthesis L4 on L5 and L5 on  S1."  "IMPRESSION:  1. L4-L5 severe spinal canal stenosis, with severe left and moderate  right neural foraminal narrowing.  2. L1-L2 mild spinal canal stenosis with severe right and mild left  neural foraminal narrowing.  3.  L3-L4 and L5-S1 moderate right neural foraminal narrowing.  4. Effacement of the lateral recesses bilaterally at L4-L5 and on  the left at L2-L3 and L3-L4, which may affect the descending left L3  and L4 nerves and the descending bilateral L5 nerves.  5. 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    Patient Stated Goals would like to learn how to alleviate some of her pain, would like to have better pain control with less medication, get more stability in her trunk,  (Feels like she is shrinking).    Currently in Pain? Yes    Pain Score 4     Pain Onset More than a month ago              TREATMENT:  +osteoporosis +recent subdural hematoma from contrecoup injury (08/05/21) Precautions from neurology per pt: no squats, stoops, or lifting over 10#   Therapeutic exercise: to centralize symptoms and improve ROM, strength, muscular endurance, and activity tolerance required for successful completion of functional activities.  - NuStep level 2 using lower extremities. Seat setting 8. For improved extremity mobility, muscular endurance, and activity tolerance; and to induce the analgesic effect of aerobic exercise, stimulate improved joint nutrition, and prepare body structures and systems for following interventions. x 6 minutes. Average SPM = 61. (Manual therapy - see below) - prone hip extension, 3x10 each side with 30 second rest between each rep.  - supine AAROM hamstring stretch with strap, 1x30 seconds each side (difficulty due to left posterior thigh pain provoked with stretch to both sides, PROM stretch failed due to pt's inability to relax - better with AAROM).  - supine sciatic nerve glide, slider technique from 90/90 position with towel support behind knee. Painful at left posterior thigh. 1x15 each side.   Manual therapy: to reduce pain and tissue tension, improve range of motion, neuromodulation, in order to promote improved ability to complete functional activities. PRONE with head in table face cradle and pillows under ankles.  - STM to bilateral lumbar paraspinals, glutes, deep hip external rotators, and hamstrings. Increased attention given to concordant painful region in proximal left lateral hamstrings and right deep glutes/hip ERs.     Pt required multimodal cuing for proper technique and to facilitate improved neuromuscular control, strength, range of motion, and functional ability resulting in improved performance and form.   HOME  EXERCISE PROGRAM Access Code: [email or web address] URL: [email or web address] Date: 09/02/2021 Prepared by: Rosita Kea   Exercises Supine Lower Trunk Rotation - 1 x daily - 1 sets - 20 reps Supine Posterior Pelvic Tilt - 1 x daily - 1 sets - 20 reps - 1 breath/5 seconds hold Seated Slump Nerve Glide - 2 x daily - 1 sets - 15 reps Diagonal Hip Extension - 1 x daily - 2 sets - 10 reps - 5 seconds hold      PT Education - 09/10/21 0912     Education Details Exercise purpose/form. Self management techniques.    Person(s) Educated Patient    Methods Explanation;Demonstration;Tactile cues;Verbal cues    Comprehension Verbalized understanding;Returned demonstration;Verbal cues required;Tactile cues required;Need further instruction              PT Short Term Goals - 08/26/21 1852       PT SHORT TERM GOAL #1   Title Be independent with initial home exercise program for self-management of symptoms.  Baseline Initial HEP to be provided at visit 2 as appropriate (08/26/2021);    Time 2    Period Weeks    Status New    Target Date 09/09/21               PT Long Term Goals - 08/26/21 1853       PT LONG TERM GOAL #1   Title Be independent with a long-term home exercise program for self-management of symptoms.    Baseline Initial HEP to be provided at visit 2 as appropriate (08/26/2021);    Time 12    Period Weeks    Status New   TARGET DATE FOR ALL LONG TERM GOALS: 11/18/2021     PT LONG TERM GOAL #2   Title Demonstrate improved FOTO to equal or greater than 69 by visit #10 to demonstrate improvement in overall condition and self-reported functional ability.    Baseline 60 (08/26/2021);    Time 12    Period Weeks    Status New      PT LONG TERM GOAL #3   Title Reduce pain with functional activities to equal or less than 1/10 to allow patient to complete usual activities including housework, stairs, standing  with less difficulty.    Baseline up to 10/10 (08/26/2021);     Time 12    Period Weeks    Status New      PT LONG TERM GOAL #4   Title Patient will ambulate equal or greater than 1000 feet during 6 Minute Walk Test to demonstrate improved community mobilty.    Baseline to be tested visit 2 (08/26/2021);    Time 12    Period Weeks    Status New      PT LONG TERM GOAL #5   Title Improve B hip strength to equal or greater than 4+/5 with no increase in pain to improve patient's ability to complete functional tasks such as hosuework, stairs, community activity  with less difficulty.    Baseline as low as 3+/5 and painful - see objective exam (08/26/2021);    Time 12    Period Weeks    Status New      Additional Long Term Goals   Additional Long Term Goals Yes      PT LONG TERM GOAL #6   Title Patient will score equal or greater than 25/30 on Functional Gait Assessment to demonstrate low fall risk.    Baseline To be tested visit 2 as appropriate (08/26/2021);    Time 12    Period Weeks    Status New                   Plan - 09/10/21 1010     Clinical Impression Statement Patient tolerated treatment with some difficulty due to concordant pulling pain especially at left posterior thigh. Patient reports no pain while laying still in prone, was sensitive and tight during STM at L proximal biceps femoris (improved with continued STM), and had significant left sided symptoms with hamstring stretch and sciatic nerve glide to both LE. Patient states in mildly stooped posture and has limited hip extension bilaterally. Reported she continued to feel pulling at left proximal posterior thigh by end of session. Attention was paid to rest breaks, any symptoms of headache, and breathing throughout session to minimize increase in intracranial pressure during interventions. Patient would benefit from continued management of limiting condition by skilled physical therapist to address remaining impairments and functional limitations to work  towards stated goals  and return to PLOF or maximal functional independence.    Personal Factors and Comorbidities Age;Past/Current Experience    Examination-Activity Limitations Squat;Stairs;Lift;Stand;Carry    Examination-Participation Restrictions Interpersonal Relationship;Meal Prep;Laundry;Cleaning;Shop;Community Activity   slows her down with usual activities such as housework, prolonged standing, balance, stairs, getting up and down from the chair or in and out of the car.   Stability/Clinical Decision Making Stable/Uncomplicated    Rehab Potential Good    PT Frequency 2x / week    PT Duration 12 weeks    PT Treatment/Interventions ADLs/Self Care Home Management;Cryotherapy;Electrical Stimulation;Moist Heat;Gait training;Functional mobility training;Therapeutic activities;Therapeutic exercise;Balance training;Neuromuscular re-education;Manual techniques;Dry needling;Passive range of motion;Patient/family education;Energy conservation    PT Next Visit Plan core/hip/functional strength    PT Home Exercise Plan Medbridge Access Code: [email or web address]    Consulted and Agree with Plan of Care Patient             Patient will benefit from skilled therapeutic intervention in order to improve the following deficits and impairments:  Abnormal gait, Decreased cognition, Pain, Postural dysfunction, Decreased mobility, Decreased activity tolerance, Decreased endurance, Decreased strength, Impaired perceived functional ability, Decreased balance, Difficulty walking, Other (comment) (hostory of fall)  Visit Diagnosis: Chronic bilateral low back pain with bilateral sciatica  History of falling  Muscle weakness (generalized)     Problem List Patient Active Problem List   Diagnosis Date Noted   Subdural hematoma 08/06/2021   History of COVID-19 03/01/2020   Prediabetes 07/06/2018   Age-related osteoporosis without current pathological fracture 12/29/2017   B12 deficiency 12/29/2017   Subcutaneous nodule 06/29/2017    Allergic rhinitis 03/19/2017   Family history of colon cancer 02/11/2017   Hypertension 12/31/2016   Hyperlipidemia 07/03/2016   History of low impact fracture of ankle 07/03/2016   Bilateral finger numbness 04/02/2016    Everlean Alstrom. Graylon Good, PT, DPT 09/10/21, 10:11 AM   [facility] [address] [place], [address] Phone: [phone]   Fax:  [phone]  Name: BRANDYCE DIMARIO MRN: [id number] Date of Birth: October 04, 1941

## 2021-09-16 ENCOUNTER — Other Ambulatory Visit: Payer: Self-pay

## 2021-09-16 ENCOUNTER — Ambulatory Visit: Payer: Medicare Other | Admitting: Physical Therapy

## 2021-09-16 ENCOUNTER — Encounter: Payer: Self-pay | Admitting: Physical Therapy

## 2021-09-16 DIAGNOSIS — M5442 Lumbago with sciatica, left side: Secondary | ICD-10-CM | POA: Diagnosis not present

## 2021-09-16 DIAGNOSIS — G8929 Other chronic pain: Secondary | ICD-10-CM

## 2021-09-16 DIAGNOSIS — Z9181 History of falling: Secondary | ICD-10-CM

## 2021-09-16 DIAGNOSIS — M6281 Muscle weakness (generalized): Secondary | ICD-10-CM

## 2021-09-16 NOTE — Therapy (Signed)
[facility] [address], [place], [address] Phone: [phone]   Fax:  [phone]  Physical Therapy Treatment  Patient Details  Name: Jane Willis MRN: [id number] Date of Birth: [birth year] Referring Provider (PT): Allene Dillon, NP   Encounter Date: 09/16/2021   PT End of Session - 09/16/21 1438     Visit Number 5    Number of Visits 24    Date for PT Re-Evaluation 11/18/21    Authorization Type [employer] MEDICARE reporting period from 08/26/2021    Progress Note Due on Visit 10    PT Start Time 1438    PT Stop Time 1516    PT Time Calculation (min) 38 min    Activity Tolerance Patient tolerated treatment well    Behavior During Therapy [facility] for tasks assessed/performed             Past Medical History:  Diagnosis Date   Allergy    Seasonal   Arthritis    Chicken pox    Hyperlipidemia    Hypertension    Urinary incontinence     Past Surgical History:  Procedure Laterality Date   ABDOMINAL HYSTERECTOMY  1986   BREAST EXCISIONAL BIOPSY Left 1970's   benign   BREAST SURGERY  1968   CATARACT EXTRACTION  2014/2015   Both eyes   FRACTURE SURGERY  01/2009   Fractured Left ankle, plate on outside of ankle 2 rods through/across ankle from inside of ankle   OOPHORECTOMY     TONSILLECTOMY AND ADENOIDECTOMY      There were no vitals filed for this visit.   Subjective Assessment - 09/16/21 1437     Subjective Patient rates her pain 1-2/10 at the top of her posterior thighs upon arrival. She states it is really low. She states she felt better after last PT session. Her HEP is going well. She thinks the nustep was helpful last session.    Pertinent History Patient is a 80 y.o. female who presents to outpatient physical therapy with a referral for medical diagnosis lumbar stenosis with neurogenic claudication, lumbar radiculitis, lumbar degenerative disc disease with note from referring clinician  for stabilization, balance/falls. This patient's chief complaints consist of pain in B low back, B buttocks, and posterior proximal thighs leading to the following functional deficits: slows her down with usual activities such as housework, prolonged standing, balance, stairs, getting up and down from the chair or in and out of the car..  Relevant past medical history and comorbidities include recent fall with subdural hematoma (08/05/2021), HTN, osteoporosis, L ankle fracture with ORIF (2010).  Patient denies hx of cancer, stroke, seizures, lung problem, major cardiac events, diabetes, unexplained weight loss, changes in bowel or bladder problems, new onset stumbling or dropping things, spinal surgeries.    Limitations House hold activities;Standing;Walking    Diagnostic tests MRI report from 07/09/2021: "Alignment: Dextrocurvature of the lumbar spine. 2 mm retrolisthesis  T12 on L1 and L1 on L2. Grade 1 anterolisthesis L4 on L5 and L5 on  S1."  "IMPRESSION:  1. L4-L5 severe spinal canal stenosis, with severe left and moderate  right neural foraminal narrowing.  2. L1-L2 mild spinal canal stenosis with severe right and mild left  neural foraminal narrowing.  3. L3-L4 and L5-S1 moderate right neural foraminal narrowing.  4. Effacement of the lateral recesses bilaterally at L4-L5 and on  the left at L2-L3 and L3-L4, which may affect the descending left L3  and  L4 nerves and the descending bilateral L5 nerves.  5. 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    Patient Stated Goals would like to learn how to alleviate some of her pain, would like to have better pain control with less medication, get more stability in her trunk, (Feels like she is shrinking).    Currently in Pain? Yes    Pain Score 2     Pain Onset More than a month ago              OBJECTIVE  SELF-REPORTED  FUNCTION FOTO score: 57/100 (lumbar spine questionnaire)   TREATMENT:  +osteoporosis +recent subdural hematoma from contrecoup injury (08/05/21) Precautions from neurology per pt: no squats, stoops, or lifting over 10#   Therapeutic exercise: to centralize symptoms and improve ROM, strength, muscular endurance, and activity tolerance required for successful completion of functional activities.  - NuStep level 2 using lower extremities. Seat setting 8. For improved extremity mobility, muscular endurance, and activity tolerance; and to induce the analgesic effect of aerobic exercise, stimulate improved joint nutrition, and prepare body structures and systems for following interventions. x 5 minutes. Average SPM = 84. (Manual therapy - see below) - prone hip extension, 3x10 each side with 30 second rest between each set. (Sat up to drink water following 2nd set due to coughing).  - supine AAROM hamstring stretch with strap, 3x30 seconds each side. (Painful at left lateal posterior thigh on both sides with significant improvement in motion by 3rd set).  - supine sciatic nerve glide, slider technique from 90/90 position with towel support behind knee.  1x15 each side.    Manual therapy: to reduce pain and tissue tension, improve range of motion, neuromodulation, in order to promote improved ability to complete functional activities. PRONE with head in table face cradle and pillows under ankles.  - STM to bilateral lumbar paraspinals, glutes, deep hip external rotators, and hamstrings. Increased attention given to concordant painful region in proximal left hamstrings and right deep glutes/hip ERs and hamstrings     Pt required multimodal cuing for proper technique and to facilitate improved neuromuscular control, strength, range of motion, and functional ability resulting in improved performance and form.   HOME EXERCISE PROGRAM Access Code: [email or web address] URL: [email or web address] Date:  09/02/2021 Prepared by: Rosita Kea   Exercises Supine Lower Trunk Rotation - 1 x daily - 1 sets - 20 reps Supine Posterior Pelvic Tilt - 1 x daily - 1 sets - 20 reps - 1 breath/5 seconds hold Seated Slump Nerve Glide - 2 x daily - 1 sets - 15 reps Diagonal Hip Extension - 1 x daily - 2 sets - 10 reps - 5 seconds hold     PT Education - 09/16/21 1438     Education Details Exercise purpose/form. Self management techniques    Person(s) Educated Patient    Methods Explanation;Demonstration;Tactile cues;Verbal cues    Comprehension Verbalized understanding;Returned demonstration;Verbal cues required;Tactile cues required;Need further instruction              PT Short Term Goals - 09/16/21 1517       PT SHORT TERM GOAL #1   Title Be independent with initial home exercise program for self-management of symptoms.    Baseline Initial HEP to be provided at visit 2 as appropriate (08/26/2021);    Time 2    Period Weeks  Status Achieved    Target Date 09/09/21               PT Long Term Goals - 08/26/21 1853       PT LONG TERM GOAL #1   Title Be independent with a long-term home exercise program for self-management of symptoms.    Baseline Initial HEP to be provided at visit 2 as appropriate (08/26/2021);    Time 12    Period Weeks    Status New   TARGET DATE FOR ALL LONG TERM GOALS: 11/18/2021     PT LONG TERM GOAL #2   Title Demonstrate improved FOTO to equal or greater than 69 by visit #10 to demonstrate improvement in overall condition and self-reported functional ability.    Baseline 60 (08/26/2021);    Time 12    Period Weeks    Status New      PT LONG TERM GOAL #3   Title Reduce pain with functional activities to equal or less than 1/10 to allow patient to complete usual activities including housework, stairs, standing  with less difficulty.    Baseline up to 10/10 (08/26/2021);    Time 12    Period Weeks    Status New      PT LONG TERM GOAL #4   Title  Patient will ambulate equal or greater than 1000 feet during 6 Minute Walk Test to demonstrate improved community mobilty.    Baseline to be tested visit 2 (08/26/2021);    Time 12    Period Weeks    Status New      PT LONG TERM GOAL #5   Title Improve B hip strength to equal or greater than 4+/5 with no increase in pain to improve patient's ability to complete functional tasks such as hosuework, stairs, community activity  with less difficulty.    Baseline as low as 3+/5 and painful - see objective exam (08/26/2021);    Time 12    Period Weeks    Status New      Additional Long Term Goals   Additional Long Term Goals Yes      PT LONG TERM GOAL #6   Title Patient will score equal or greater than 25/30 on Functional Gait Assessment to demonstrate low fall risk.    Baseline To be tested visit 2 as appropriate (08/26/2021);    Time 12    Period Weeks    Status New                   Plan - 09/16/21 1517     Clinical Impression Statement Patient tolerated treatment well overall with concordant pain produced with palpation at hamstrings (R > L) and deep posterior hip (L > R). Similar interventions performed today as last session as patient reported improved following last session. Patient would benefit from continued management of limiting condition by skilled physical therapist to address remaining impairments and functional limitations to work towards stated goals and return to PLOF or maximal functional independence.    Personal Factors and Comorbidities Age;Past/Current Experience    Examination-Activity Limitations Squat;Stairs;Lift;Stand;Carry    Examination-Participation Restrictions Interpersonal Relationship;Meal Prep;Laundry;Cleaning;Shop;Community Activity   slows her down with usual activities such as housework, prolonged standing, balance, stairs, getting up and down from the chair or in and out of the car.   Stability/Clinical Decision Making Stable/Uncomplicated     Rehab Potential Good    PT Frequency 2x / week    PT Duration 12 weeks  PT Treatment/Interventions ADLs/Self Care Home Management;Cryotherapy;Electrical Stimulation;Moist Heat;Gait training;Functional mobility training;Therapeutic activities;Therapeutic exercise;Balance training;Neuromuscular re-education;Manual techniques;Dry needling;Passive range of motion;Patient/family education;Energy conservation    PT Next Visit Plan core/hip/functional strength    PT Home Exercise Plan Medbridge Access Code: [email or web address]    Consulted and Agree with Plan of Care Patient             Patient will benefit from skilled therapeutic intervention in order to improve the following deficits and impairments:  Abnormal gait, Decreased cognition, Pain, Postural dysfunction, Decreased mobility, Decreased activity tolerance, Decreased endurance, Decreased strength, Impaired perceived functional ability, Decreased balance, Difficulty walking, Other (comment) (hostory of fall)  Visit Diagnosis: Chronic bilateral low back pain with bilateral sciatica  History of falling  Muscle weakness (generalized)     Problem List Patient Active Problem List   Diagnosis Date Noted   Subdural hematoma 08/06/2021   History of COVID-19 03/01/2020   Prediabetes 07/06/2018   Age-related osteoporosis without current pathological fracture 12/29/2017   B12 deficiency 12/29/2017   Subcutaneous nodule 06/29/2017   Allergic rhinitis 03/19/2017   Family history of colon cancer 02/11/2017   Hypertension 12/31/2016   Hyperlipidemia 07/03/2016   History of low impact fracture of ankle 07/03/2016   Bilateral finger numbness 04/02/2016    Everlean Alstrom. Graylon Good, PT, DPT 09/16/21, 3:19 PM   [facility] [address] [place], [address] Phone: [phone]   Fax:  [phone]  Name: Jane Willis MRN: [id number] Date of Birth: 1941/01/27

## 2021-09-18 ENCOUNTER — Ambulatory Visit: Payer: Medicare Other | Attending: Family Medicine | Admitting: Physical Therapy

## 2021-09-18 ENCOUNTER — Encounter: Payer: Self-pay | Admitting: Physical Therapy

## 2021-09-18 DIAGNOSIS — M5442 Lumbago with sciatica, left side: Secondary | ICD-10-CM | POA: Diagnosis not present

## 2021-09-18 DIAGNOSIS — Z9181 History of falling: Secondary | ICD-10-CM | POA: Insufficient documentation

## 2021-09-18 DIAGNOSIS — M5441 Lumbago with sciatica, right side: Secondary | ICD-10-CM | POA: Diagnosis present

## 2021-09-18 DIAGNOSIS — M6281 Muscle weakness (generalized): Secondary | ICD-10-CM | POA: Diagnosis present

## 2021-09-18 DIAGNOSIS — G8929 Other chronic pain: Secondary | ICD-10-CM | POA: Diagnosis present

## 2021-09-18 NOTE — Therapy (Signed)
[facility] [address], [place], [address] Phone: [phone]   Fax:  [phone]  Physical Therapy Treatment  Patient Details  Name: Jane Willis MRN: [id number] Date of Birth: 08/31/1941 Referring Provider (PT): Allene Dillon, NP   Encounter Date: 09/18/2021   PT End of Session - 09/18/21 1007     Visit Number 6    Number of Visits 24    Date for PT Re-Evaluation 11/18/21    Authorization Type [employer] MEDICARE reporting period from 08/26/2021    Progress Note Due on Visit 10    PT Start Time 0949    PT Stop Time 1027    PT Time Calculation (min) 38 min    Activity Tolerance Patient tolerated treatment well    Behavior During Therapy [facility] for tasks assessed/performed             Past Medical History:  Diagnosis Date   Allergy    Seasonal   Arthritis    Chicken pox    Hyperlipidemia    Hypertension    Urinary incontinence     Past Surgical History:  Procedure Laterality Date   ABDOMINAL HYSTERECTOMY  1986   BREAST EXCISIONAL BIOPSY Left 1970's   benign   BREAST SURGERY  1968   CATARACT EXTRACTION  2014/2015   Both eyes   FRACTURE SURGERY  01/2009   Fractured Left ankle, plate on outside of ankle 2 rods through/across ankle from inside of ankle   OOPHORECTOMY     TONSILLECTOMY AND ADENOIDECTOMY      There were no vitals filed for this visit.   Subjective Assessment - 09/18/21 0952     Subjective Patient reports she continues to have pain in her posterior proximal thighs of 3-4/10. She states it was sore all day yesterday and today. She thinks PT is helping because it feels like it should help when she is doing the exercises/interventions. She reports no falls since last PT session.    Pertinent History Patient is a 80 y.o. female who presents to outpatient physical therapy with a referral for medical diagnosis lumbar stenosis with neurogenic claudication, lumbar radiculitis, lumbar  degenerative disc disease with note from referring clinician for stabilization, balance/falls. This patient's chief complaints consist of pain in B low back, B buttocks, and posterior proximal thighs leading to the following functional deficits: slows her down with usual activities such as housework, prolonged standing, balance, stairs, getting up and down from the chair or in and out of the car..  Relevant past medical history and comorbidities include recent fall with subdural hematoma (08/05/2021), HTN, osteoporosis, L ankle fracture with ORIF (2010).  Patient denies hx of cancer, stroke, seizures, lung problem, major cardiac events, diabetes, unexplained weight loss, changes in bowel or bladder problems, new onset stumbling or dropping things, spinal surgeries.    Limitations House hold activities;Standing;Walking    Diagnostic tests MRI report from 07/09/2021: "Alignment: Dextrocurvature of the lumbar spine. 2 mm retrolisthesis  T12 on L1 and L1 on L2. Grade 1 anterolisthesis L4 on L5 and L5 on  S1."  "IMPRESSION:  1. L4-L5 severe spinal canal stenosis, with severe left and moderate  right neural foraminal narrowing.  2. L1-L2 mild spinal canal stenosis with severe right and mild left  neural foraminal narrowing.  3. L3-L4 and L5-S1 moderate right neural foraminal narrowing.  4. Effacement of the lateral recesses bilaterally at L4-L5 and on  the left at L2-L3 and L3-L4, which  may affect the descending left L3  and L4 nerves and the descending bilateral L5 nerves.  5. 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    Patient Stated Goals would like to learn how to alleviate some of her pain, would like to have better pain control with less medication, get more stability in her trunk, (Feels like she is shrinking).    Currently in Pain? Yes    Pain Score 4     Pain Onset More than a  month ago               TREATMENT:  +osteoporosis +recent subdural hematoma from contrecoup injury (08/05/21) Precautions from neurology per pt: no squats, stoops, or lifting over 10#   Therapeutic exercise: to centralize symptoms and improve ROM, strength, muscular endurance, and activity tolerance required for successful completion of functional activities.  - NuStep level 3 using lower extremities. Seat setting 8. For improved extremity mobility, muscular endurance, and activity tolerance; and to induce the analgesic effect of aerobic exercise, stimulate improved joint nutrition, and prepare body structures and systems for following interventions. x 5 minutes. Average SPM = 93. - OMEGA machine hamstring curls, B LE, 3x10 at 15/20/15# - seated rows with 10# cable, 3x10.  - seated lumbar flexion roll out with theraball, 1x20 forwards, 1x20 alternating diagonals (Feels good).  - seated (on chair) posterior pelvic tilt (PPT), 1x10 with acceptable form, 1x6 with worsening form despite cuing.  - seated PPT on theraball with CGA gaurding, 1x10 with limited success.   Pt required multimodal cuing for proper technique and to facilitate improved neuromuscular control, strength, range of motion, and functional ability resulting in improved performance and form.   HOME EXERCISE PROGRAM Access Code: [email or web address] URL: [email or web address] Date: 09/02/2021 Prepared by: Rosita Kea   Exercises Supine Lower Trunk Rotation - 1 x daily - 1 sets - 20 reps Supine Posterior Pelvic Tilt - 1 x daily - 1 sets - 20 reps - 1 breath/5 seconds hold Seated Slump Nerve Glide - 2 x daily - 1 sets - 15 reps Diagonal Hip Extension - 1 x daily - 2 sets - 10 reps - 5 seconds hold     PT Education - 09/18/21 1006     Education Details Exercise purpose/form. Self management techniques    Person(s) Educated Patient    Methods Explanation;Demonstration;Tactile cues;Verbal cues    Comprehension  Verbalized understanding;Returned demonstration;Verbal cues required;Tactile cues required;Need further instruction              PT Short Term Goals - 09/16/21 1517       PT SHORT TERM GOAL #1   Title Be independent with initial home exercise program for self-management of symptoms.    Baseline Initial HEP to be provided at visit 2 as appropriate (08/26/2021);    Time 2    Period Weeks    Status Achieved    Target Date 09/09/21               PT Long Term Goals - 08/26/21 1853       PT LONG TERM GOAL #1   Title Be independent with a long-term home exercise program for self-management of symptoms.    Baseline Initial HEP to be provided at visit 2 as appropriate (08/26/2021);    Time 12    Period Weeks    Status New  TARGET DATE FOR ALL LONG TERM GOALS: 11/18/2021     PT LONG TERM GOAL #2   Title Demonstrate improved FOTO to equal or greater than 69 by visit #10 to demonstrate improvement in overall condition and self-reported functional ability.    Baseline 60 (08/26/2021);    Time 12    Period Weeks    Status New      PT LONG TERM GOAL #3   Title Reduce pain with functional activities to equal or less than 1/10 to allow patient to complete usual activities including housework, stairs, standing  with less difficulty.    Baseline up to 10/10 (08/26/2021);    Time 12    Period Weeks    Status New      PT LONG TERM GOAL #4   Title Patient will ambulate equal or greater than 1000 feet during 6 Minute Walk Test to demonstrate improved community mobilty.    Baseline to be tested visit 2 (08/26/2021);    Time 12    Period Weeks    Status New      PT LONG TERM GOAL #5   Title Improve B hip strength to equal or greater than 4+/5 with no increase in pain to improve patient's ability to complete functional tasks such as hosuework, stairs, community activity  with less difficulty.    Baseline as low as 3+/5 and painful - see objective exam (08/26/2021);    Time 12    Period  Weeks    Status New      Additional Long Term Goals   Additional Long Term Goals Yes      PT LONG TERM GOAL #6   Title Patient will score equal or greater than 25/30 on Functional Gait Assessment to demonstrate low fall risk.    Baseline To be tested visit 2 as appropriate (08/26/2021);    Time 12    Period Weeks    Status New                   Plan - 09/18/21 1150     Clinical Impression Statement Patient arrives today with worse concordant pain in her proximal hamstring region. Today's session focused on exercises for LE, hamstring, posture, and motor control of spine. Patient felt better today in seated position and found lumbar flexion exercise decreased her concordant pain. Attempted to progress posterior pelvic tilt to more functional seated position, but patient had very limited ability to initiate or complete full movement from the pelvis and was inadvertently moving into further extension initiated by the upper trunk in her attempts to move pelvis posteriorly. Will need continued practice and cuing as she appears to be someone who would benefit from holding lumbar spine in increased flexion during functional activities.  Patient would benefit from continued management of limiting condition by skilled physical therapist to address remaining impairments and functional limitations to work towards stated goals and return to PLOF or maximal functional independence.    Personal Factors and Comorbidities Age;Past/Current Experience    Examination-Activity Limitations Squat;Stairs;Lift;Stand;Carry    Examination-Participation Restrictions Interpersonal Relationship;Meal Prep;Laundry;Cleaning;Shop;Community Activity   slows her down with usual activities such as housework, prolonged standing, balance, stairs, getting up and down from the chair or in and out of the car.   Stability/Clinical Decision Making Stable/Uncomplicated    Rehab Potential Good    PT Frequency 2x / week    PT  Duration 12 weeks    PT Treatment/Interventions ADLs/Self Care Home Management;Cryotherapy;Electrical Stimulation;Moist Heat;Gait training;Functional  mobility training;Therapeutic activities;Therapeutic exercise;Balance training;Neuromuscular re-education;Manual techniques;Dry needling;Passive range of motion;Patient/family education;Energy conservation    PT Next Visit Plan core/hip/functional strength    PT Home Exercise Plan Medbridge Access Code: [email or web address]    Consulted and Agree with Plan of Care Patient             Patient will benefit from skilled therapeutic intervention in order to improve the following deficits and impairments:  Abnormal gait, Decreased cognition, Pain, Postural dysfunction, Decreased mobility, Decreased activity tolerance, Decreased endurance, Decreased strength, Impaired perceived functional ability, Decreased balance, Difficulty walking, Other (comment) (hostory of fall)  Visit Diagnosis: Chronic bilateral low back pain with bilateral sciatica  History of falling  Muscle weakness (generalized)     Problem List Patient Active Problem List   Diagnosis Date Noted   Subdural hematoma 08/06/2021   History of COVID-19 03/01/2020   Prediabetes 07/06/2018   Age-related osteoporosis without current pathological fracture 12/29/2017   B12 deficiency 12/29/2017   Subcutaneous nodule 06/29/2017   Allergic rhinitis 03/19/2017   Family history of colon cancer 02/11/2017   Hypertension 12/31/2016   Hyperlipidemia 07/03/2016   History of low impact fracture of ankle 07/03/2016   Bilateral finger numbness 04/02/2016    Everlean Alstrom. Graylon Good, PT, DPT 09/18/21, 11:51 AM   [facility] [address], [place], [address] Phone: [phone]   Fax:  [phone]  Name: Jane Willis MRN: [id number] Date of Birth: 26-Jun-1941

## 2021-09-23 ENCOUNTER — Encounter: Payer: Self-pay | Admitting: Physical Therapy

## 2021-09-23 ENCOUNTER — Ambulatory Visit: Payer: Medicare Other | Admitting: Physical Therapy

## 2021-09-23 DIAGNOSIS — M6281 Muscle weakness (generalized): Secondary | ICD-10-CM

## 2021-09-23 DIAGNOSIS — Z9181 History of falling: Secondary | ICD-10-CM

## 2021-09-23 DIAGNOSIS — G8929 Other chronic pain: Secondary | ICD-10-CM

## 2021-09-23 DIAGNOSIS — M5442 Lumbago with sciatica, left side: Secondary | ICD-10-CM

## 2021-09-23 NOTE — Therapy (Signed)
[facility] [address], [place], [address] Phone: [phone]   Fax:  [phone]  Physical Therapy Treatment  Patient Details  Name: Jane Willis MRN: [id number] Date of Birth: 1941/05/02 Referring Provider (PT): Allene Dillon, NP   Encounter Date: 09/23/2021   PT End of Session - 09/23/21 1427     Visit Number 7    Number of Visits 24    Date for PT Re-Evaluation 11/18/21    Authorization Type [employer] MEDICARE reporting period from 08/26/2021    Progress Note Due on Visit 10    PT Start Time 1430    PT Stop Time 1510    PT Time Calculation (min) 40 min    Activity Tolerance Patient tolerated treatment well    Behavior During Therapy [facility] for tasks assessed/performed             Past Medical History:  Diagnosis Date   Allergy    Seasonal   Arthritis    Chicken pox    Hyperlipidemia    Hypertension    Urinary incontinence     Past Surgical History:  Procedure Laterality Date   ABDOMINAL HYSTERECTOMY  1986   BREAST EXCISIONAL BIOPSY Left 1970's   benign   BREAST SURGERY  1968   CATARACT EXTRACTION  2014/2015   Both eyes   FRACTURE SURGERY  01/2009   Fractured Left ankle, plate on outside of ankle 2 rods through/across ankle from inside of ankle   OOPHORECTOMY     TONSILLECTOMY AND ADENOIDECTOMY      There were no vitals filed for this visit.   Subjective Assessment - 09/23/21 1433     Subjective Patient reports she has pain near her left gluteal fold of 2/10 upon arrival. Reports no falls since last PT session. States she could "tell she worked out" after last PT session. She states her pain is getting better overall since starting PT. Reports HEP is going well and she is doing them daily. She is trying to get some christmas gifts this week.    Pertinent History Patient is a 80 y.o. female who presents to outpatient physical therapy with a referral for medical diagnosis lumbar  stenosis with neurogenic claudication, lumbar radiculitis, lumbar degenerative disc disease with note from referring clinician for stabilization, balance/falls. This patient's chief complaints consist of pain in B low back, B buttocks, and posterior proximal thighs leading to the following functional deficits: slows her down with usual activities such as housework, prolonged standing, balance, stairs, getting up and down from the chair or in and out of the car..  Relevant past medical history and comorbidities include recent fall with subdural hematoma (08/05/2021), HTN, osteoporosis, L ankle fracture with ORIF (2010).  Patient denies hx of cancer, stroke, seizures, lung problem, major cardiac events, diabetes, unexplained weight loss, changes in bowel or bladder problems, new onset stumbling or dropping things, spinal surgeries.    Limitations House hold activities;Standing;Walking    Diagnostic tests MRI report from 07/09/2021: "Alignment: Dextrocurvature of the lumbar spine. 2 mm retrolisthesis  T12 on L1 and L1 on L2. Grade 1 anterolisthesis L4 on L5 and L5 on  S1."  "IMPRESSION:  1. L4-L5 severe spinal canal stenosis, with severe left and moderate  right neural foraminal narrowing.  2. L1-L2 mild spinal canal stenosis with severe right and mild left  neural foraminal narrowing.  3. L3-L4 and L5-S1 moderate right neural foraminal narrowing.  4. Effacement of the lateral  recesses bilaterally at L4-L5 and on  the left at L2-L3 and L3-L4, which may affect the descending left L3  and L4 nerves and the descending bilateral L5 nerves.  5. 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    Patient Stated Goals would like to learn how to alleviate some of her pain, would like to have better pain control with less medication, get more stability in her trunk, (Feels like she is shrinking).     Currently in Pain? Yes    Pain Score 2     Pain Onset More than a month ago             TREATMENT:  +osteoporosis +recent subdural hematoma from contrecoup injury (08/05/21) Precautions from neurology per pt: no squats, stoops, or lifting over 10#   Therapeutic exercise: to centralize symptoms and improve ROM, strength, muscular endurance, and activity tolerance required for successful completion of functional activities.  - NuStep level 4 using lower extremities. Seat setting 8. For improved extremity mobility, muscular endurance, and activity tolerance; and to induce the analgesic effect of aerobic exercise, stimulate improved joint nutrition, and prepare body structures and systems for following interventions. x 5 minutes. Average SPM = 90. (Breathing).  - OMEGA machine hamstring curls, B LE, 3x10 at 20# - OMEGA machine knee extension, B LE, 2x10 at 10#.  - hooklying posterior pelvic tilt (PPT), 1x20 plus additional reps to improve form with cuing.  - seated PPT on theraball with hands on counter in front of pt, elbows straight (to prevent upper back compensation), SBA, 1x20 - seated (on theraball) rows with 10# cable, 2x10. SBA (rated medium) - seated (on theraball) single arm rows with 10# cable, 1x10 each side. SBA (rated medium) - seated lumbar flexion roll out with theraball, 1x5 forwards and to each lateral (Feels good).     Pt required multimodal cuing for proper technique and to facilitate improved neuromuscular control, strength, range of motion, and functional ability resulting in improved performance and form.   HOME EXERCISE PROGRAM Access Code: [email or web address] URL: [email or web address] Date: 09/02/2021 Prepared by: Rosita Kea   Exercises Supine Lower Trunk Rotation - 1 x daily - 1 sets - 20 reps Supine Posterior Pelvic Tilt - 1 x daily - 1 sets - 20 reps - 1 breath/5 seconds hold Seated Slump Nerve Glide - 2 x daily - 1 sets - 15 reps Diagonal Hip  Extension - 1 x daily - 2 sets - 10 reps - 5 seconds hold     PT Education - 09/23/21 1427     Education Details Exercise purpose/form. Self management techniques    Person(s) Educated Patient    Methods Explanation;Demonstration;Tactile cues;Verbal cues    Comprehension Verbalized understanding;Returned demonstration;Verbal cues required;Tactile cues required;Need further instruction              PT Short Term Goals - 09/16/21 1517       PT SHORT TERM GOAL #1   Title Be independent with initial home exercise program for self-management of symptoms.    Baseline Initial HEP to be provided at visit 2 as appropriate (08/26/2021);    Time 2    Period Weeks    Status Achieved    Target Date 09/09/21               PT Long Term Goals - 08/26/21 1853  PT LONG TERM GOAL #1   Title Be independent with a long-term home exercise program for self-management of symptoms.    Baseline Initial HEP to be provided at visit 2 as appropriate (08/26/2021);    Time 12    Period Weeks    Status New   TARGET DATE FOR ALL LONG TERM GOALS: 11/18/2021     PT LONG TERM GOAL #2   Title Demonstrate improved FOTO to equal or greater than 69 by visit #10 to demonstrate improvement in overall condition and self-reported functional ability.    Baseline 60 (08/26/2021);    Time 12    Period Weeks    Status New      PT LONG TERM GOAL #3   Title Reduce pain with functional activities to equal or less than 1/10 to allow patient to complete usual activities including housework, stairs, standing  with less difficulty.    Baseline up to 10/10 (08/26/2021);    Time 12    Period Weeks    Status New      PT LONG TERM GOAL #4   Title Patient will ambulate equal or greater than 1000 feet during 6 Minute Walk Test to demonstrate improved community mobilty.    Baseline to be tested visit 2 (08/26/2021);    Time 12    Period Weeks    Status New      PT LONG TERM GOAL #5   Title Improve B hip strength  to equal or greater than 4+/5 with no increase in pain to improve patient's ability to complete functional tasks such as hosuework, stairs, community activity  with less difficulty.    Baseline as low as 3+/5 and painful - see objective exam (08/26/2021);    Time 12    Period Weeks    Status New      Additional Long Term Goals   Additional Long Term Goals Yes      PT LONG TERM GOAL #6   Title Patient will score equal or greater than 25/30 on Functional Gait Assessment to demonstrate low fall risk.    Baseline To be tested visit 2 as appropriate (08/26/2021);    Time 12    Period Weeks    Status New                   Plan - 09/23/21 1541     Clinical Impression Statement Patient tolerated treatment well overall and was better able to control pelvic tilt in sitting with the exercise broken down and more cuing. Continued to work on LE and core strength and motor control. Patient would benefit from continued management of limiting condition by skilled physical therapist to address remaining impairments and functional limitations to work towards stated goals and return to PLOF or maximal functional independence.    Personal Factors and Comorbidities Age;Past/Current Experience    Examination-Activity Limitations Squat;Stairs;Lift;Stand;Carry    Examination-Participation Restrictions Interpersonal Relationship;Meal Prep;Laundry;Cleaning;Shop;Community Activity   slows her down with usual activities such as housework, prolonged standing, balance, stairs, getting up and down from the chair or in and out of the car.   Stability/Clinical Decision Making Stable/Uncomplicated    Rehab Potential Good    PT Frequency 2x / week    PT Duration 12 weeks    PT Treatment/Interventions ADLs/Self Care Home Management;Cryotherapy;Electrical Stimulation;Moist Heat;Gait training;Functional mobility training;Therapeutic activities;Therapeutic exercise;Balance training;Neuromuscular re-education;Manual  techniques;Dry needling;Passive range of motion;Patient/family education;Energy conservation    PT Next Visit Plan core/hip/functional strength    PT Home  Exercise Plan Medbridge Access Code: [email or web address]    Consulted and Agree with Plan of Care Patient             Patient will benefit from skilled therapeutic intervention in order to improve the following deficits and impairments:  Abnormal gait, Decreased cognition, Pain, Postural dysfunction, Decreased mobility, Decreased activity tolerance, Decreased endurance, Decreased strength, Impaired perceived functional ability, Decreased balance, Difficulty walking, Other (comment) (hostory of fall)  Visit Diagnosis: Chronic bilateral low back pain with bilateral sciatica  History of falling  Muscle weakness (generalized)     Problem List Patient Active Problem List   Diagnosis Date Noted   Subdural hematoma 08/06/2021   History of COVID-19 03/01/2020   Prediabetes 07/06/2018   Age-related osteoporosis without current pathological fracture 12/29/2017   B12 deficiency 12/29/2017   Subcutaneous nodule 06/29/2017   Allergic rhinitis 03/19/2017   Family history of colon cancer 02/11/2017   Hypertension 12/31/2016   Hyperlipidemia 07/03/2016   History of low impact fracture of ankle 07/03/2016   Bilateral finger numbness 04/02/2016    Everlean Alstrom. Graylon Good, PT, DPT 09/23/21, 3:42 PM   [facility] [address], [place], [address] Phone: [phone]   Fax:  [phone]  Name: Jane Willis MRN: [id number] Date of Birth: 06-08-1941

## 2021-09-25 ENCOUNTER — Encounter: Payer: Self-pay | Admitting: Physical Therapy

## 2021-09-25 ENCOUNTER — Ambulatory Visit: Payer: Medicare Other | Admitting: Physical Therapy

## 2021-09-25 DIAGNOSIS — Z9181 History of falling: Secondary | ICD-10-CM

## 2021-09-25 DIAGNOSIS — M5442 Lumbago with sciatica, left side: Secondary | ICD-10-CM | POA: Diagnosis not present

## 2021-09-25 DIAGNOSIS — G8929 Other chronic pain: Secondary | ICD-10-CM

## 2021-09-25 DIAGNOSIS — M6281 Muscle weakness (generalized): Secondary | ICD-10-CM

## 2021-09-25 NOTE — Therapy (Signed)
[facility] [address], [place], [address] Phone: [phone]   Fax:  [phone]  Physical Therapy Treatment  Patient Details  Name: Jane Willis MRN: [id number] Date of Birth: 10-Jun-1941 Referring Provider (PT): Allene Dillon, NP   Encounter Date: 09/25/2021   PT End of Session - 09/25/21 1444     Visit Number 8    Number of Visits 24    Date for PT Re-Evaluation 11/18/21    Authorization Type [employer] MEDICARE reporting period from 08/26/2021    Progress Note Due on Visit 10    PT Start Time 2778    PT Stop Time 1515    PT Time Calculation (min) 38 min    Activity Tolerance Patient tolerated treatment well    Behavior During Therapy [facility] for tasks assessed/performed             Past Medical History:  Diagnosis Date   Allergy    Seasonal   Arthritis    Chicken pox    Hyperlipidemia    Hypertension    Urinary incontinence     Past Surgical History:  Procedure Laterality Date   ABDOMINAL HYSTERECTOMY  1986   BREAST EXCISIONAL BIOPSY Left 1970's   benign   BREAST SURGERY  1968   CATARACT EXTRACTION  2014/2015   Both eyes   FRACTURE SURGERY  01/2009   Fractured Left ankle, plate on outside of ankle 2 rods through/across ankle from inside of ankle   OOPHORECTOMY     TONSILLECTOMY AND ADENOIDECTOMY      There were no vitals filed for this visit.   Subjective Assessment - 09/25/21 1440     Subjective Patient reports she has pain in her upper posterior thighs, rated 3-4/10, left more than right. She states she felt better after last PT session until the next morning. States her HEP is going well.    Pertinent History Patient is a 80 y.o. female who presents to outpatient physical therapy with a referral for medical diagnosis lumbar stenosis with neurogenic claudication, lumbar radiculitis, lumbar degenerative disc disease with note from referring clinician for stabilization, balance/falls.  This patient's chief complaints consist of pain in B low back, B buttocks, and posterior proximal thighs leading to the following functional deficits: slows her down with usual activities such as housework, prolonged standing, balance, stairs, getting up and down from the chair or in and out of the car..  Relevant past medical history and comorbidities include recent fall with subdural hematoma (08/05/2021), HTN, osteoporosis, L ankle fracture with ORIF (2010).  Patient denies hx of cancer, stroke, seizures, lung problem, major cardiac events, diabetes, unexplained weight loss, changes in bowel or bladder problems, new onset stumbling or dropping things, spinal surgeries.    Limitations House hold activities;Standing;Walking    Diagnostic tests MRI report from 07/09/2021: "Alignment: Dextrocurvature of the lumbar spine. 2 mm retrolisthesis  T12 on L1 and L1 on L2. Grade 1 anterolisthesis L4 on L5 and L5 on  S1."  "IMPRESSION:  1. L4-L5 severe spinal canal stenosis, with severe left and moderate  right neural foraminal narrowing.  2. L1-L2 mild spinal canal stenosis with severe right and mild left  neural foraminal narrowing.  3. L3-L4 and L5-S1 moderate right neural foraminal narrowing.  4. Effacement of the lateral recesses bilaterally at L4-L5 and on  the left at L2-L3 and L3-L4, which may affect the descending left L3  and L4 nerves and the descending bilateral L5  nerves.  5. 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    Patient Stated Goals would like to learn how to alleviate some of her pain, would like to have better pain control with less medication, get more stability in her trunk, (Feels like she is shrinking).    Currently in Pain? Yes    Pain Score 4     Pain Onset More than a month ago             TREATMENT:  +osteoporosis +recent subdural hematoma from  contrecoup injury (08/05/21) Precautions from neurology per pt: no squats, stoops, or lifting over 10#   Therapeutic exercise: to centralize symptoms and improve ROM, strength, muscular endurance, and activity tolerance required for successful completion of functional activities.  - NuStep level 4 using lower extremities. Seat setting 8. For improved extremity mobility, muscular endurance, and activity tolerance; and to induce the analgesic effect of aerobic exercise, stimulate improved joint nutrition, and prepare body structures and systems for following interventions. x 5 minutes. Average SPM = 84. - OMEGA machine hamstring curls, B LE, 3x10 at 20/25/25# (easy/medium - decreased ROM).  - OMEGA machine knee extension, B LE, 3x10 at 15#. (Pain at left lateral posterior thigh). - hooklying posterior pelvic tilt (PPT), 1x20  - seated PPT on theraball with hands on counter in front of pt, elbows straight (to prevent upper back compensation), SBA, 1x20 - seated PPT on theraball with hands on knees (starts to lose form) SBA, 1x20 - hooklying posterior pelvic tilt (PPT), 1x5 - hooklying PPT with march (each rep), 1x10 each side (PT counting 1,2,3,4 for each step of exercise).   - seated (on theraball) single arm rows with 10# cable, 2x10 each side. SBA (rated medium)   Pt required multimodal cuing for proper technique and to facilitate improved neuromuscular control, strength, range of motion, and functional ability resulting in improved performance and form.   HOME EXERCISE PROGRAM Access Code: [email or web address] URL: [email or web address] Date: 09/25/2021 Prepared by: Rosita Kea  Exercises Supine Lower Trunk Rotation - 1 x daily - 1 sets - 20 reps Supine March with Posterior Pelvic Tilt - 1 x daily - 3 sets - 10 reps Seated Slump Nerve Glide - 2 x daily - 1 sets - 15 reps Diagonal Hip Extension - 1 x daily - 2 sets - 10 reps - 5 seconds hold Seated Pelvic Tilt - 1 x daily - 1 sets - 20  reps - 5 seconds hold     PT Education - 09/25/21 1444     Education Details Exercise purpose/form. Self management techniques    Person(s) Educated Patient    Methods Explanation;Demonstration;Tactile cues;Verbal cues    Comprehension Verbalized understanding;Returned demonstration;Verbal cues required;Tactile cues required;Need further instruction              PT Short Term Goals - 09/16/21 1517       PT SHORT TERM GOAL #1   Title Be independent with initial home exercise program for self-management of symptoms.    Baseline Initial HEP to be provided at visit 2 as appropriate (08/26/2021);    Time 2    Period Weeks    Status Achieved    Target Date 09/09/21               PT Long Term Goals - 08/26/21 1853  PT LONG TERM GOAL #1   Title Be independent with a long-term home exercise program for self-management of symptoms.    Baseline Initial HEP to be provided at visit 2 as appropriate (08/26/2021);    Time 12    Period Weeks    Status New   TARGET DATE FOR ALL LONG TERM GOALS: 11/18/2021     PT LONG TERM GOAL #2   Title Demonstrate improved FOTO to equal or greater than 69 by visit #10 to demonstrate improvement in overall condition and self-reported functional ability.    Baseline 60 (08/26/2021);    Time 12    Period Weeks    Status New      PT LONG TERM GOAL #3   Title Reduce pain with functional activities to equal or less than 1/10 to allow patient to complete usual activities including housework, stairs, standing  with less difficulty.    Baseline up to 10/10 (08/26/2021);    Time 12    Period Weeks    Status New      PT LONG TERM GOAL #4   Title Patient will ambulate equal or greater than 1000 feet during 6 Minute Walk Test to demonstrate improved community mobilty.    Baseline to be tested visit 2 (08/26/2021);    Time 12    Period Weeks    Status New      PT LONG TERM GOAL #5   Title Improve B hip strength to equal or greater than 4+/5 with  no increase in pain to improve patient's ability to complete functional tasks such as hosuework, stairs, community activity  with less difficulty.    Baseline as low as 3+/5 and painful - see objective exam (08/26/2021);    Time 12    Period Weeks    Status New      Additional Long Term Goals   Additional Long Term Goals Yes      PT LONG TERM GOAL #6   Title Patient will score equal or greater than 25/30 on Functional Gait Assessment to demonstrate low fall risk.    Baseline To be tested visit 2 as appropriate (08/26/2021);    Time 12    Period Weeks    Status New                   Plan - 09/25/21 1450     Clinical Impression Statement Patient tolerated treatment well overall but continues to report similar proximal posterior thigh pain upon arrival. Continued to work on LE strength and lumbopelvic motor control as tolerated. Patient would benefit from continued management of limiting condition by skilled physical therapist to address remaining impairments and functional limitations to work towards stated goals and return to PLOF or maximal functional independence.    Personal Factors and Comorbidities Age;Past/Current Experience    Examination-Activity Limitations Squat;Stairs;Lift;Stand;Carry    Examination-Participation Restrictions Interpersonal Relationship;Meal Prep;Laundry;Cleaning;Shop;Community Activity   slows her down with usual activities such as housework, prolonged standing, balance, stairs, getting up and down from the chair or in and out of the car.   Stability/Clinical Decision Making Stable/Uncomplicated    Rehab Potential Good    PT Frequency 2x / week    PT Duration 12 weeks    PT Treatment/Interventions ADLs/Self Care Home Management;Cryotherapy;Electrical Stimulation;Moist Heat;Gait training;Functional mobility training;Therapeutic activities;Therapeutic exercise;Balance training;Neuromuscular re-education;Manual techniques;Dry needling;Passive range of  motion;Patient/family education;Energy conservation    PT Next Visit Plan core/hip/functional strength    PT Home Exercise Plan Medbridge Access Code: [email or web address]  Consulted and Agree with Plan of Care Patient             Patient will benefit from skilled therapeutic intervention in order to improve the following deficits and impairments:  Abnormal gait, Decreased cognition, Pain, Postural dysfunction, Decreased mobility, Decreased activity tolerance, Decreased endurance, Decreased strength, Impaired perceived functional ability, Decreased balance, Difficulty walking, Other (comment) (hostory of fall)  Visit Diagnosis: Chronic bilateral low back pain with bilateral sciatica  History of falling  Muscle weakness (generalized)     Problem List Patient Active Problem List   Diagnosis Date Noted   Subdural hematoma 08/06/2021   History of COVID-19 03/01/2020   Prediabetes 07/06/2018   Age-related osteoporosis without current pathological fracture 12/29/2017   B12 deficiency 12/29/2017   Subcutaneous nodule 06/29/2017   Allergic rhinitis 03/19/2017   Family history of colon cancer 02/11/2017   Hypertension 12/31/2016   Hyperlipidemia 07/03/2016   History of low impact fracture of ankle 07/03/2016   Bilateral finger numbness 04/02/2016    Everlean Alstrom. Graylon Good, PT, DPT 09/25/21, 3:15 PM  [facility] [address] [place], [address] Phone: [phone]   Fax:  [phone]  Name: Jane Willis MRN: [id number] Date of Birth: Jul 05, 1941

## 2021-09-30 ENCOUNTER — Ambulatory Visit: Payer: Medicare Other | Admitting: Physical Therapy

## 2021-09-30 ENCOUNTER — Encounter: Payer: Self-pay | Admitting: Physical Therapy

## 2021-09-30 DIAGNOSIS — M5442 Lumbago with sciatica, left side: Secondary | ICD-10-CM | POA: Diagnosis not present

## 2021-09-30 DIAGNOSIS — M6281 Muscle weakness (generalized): Secondary | ICD-10-CM

## 2021-09-30 DIAGNOSIS — Z9181 History of falling: Secondary | ICD-10-CM

## 2021-09-30 DIAGNOSIS — G8929 Other chronic pain: Secondary | ICD-10-CM

## 2021-09-30 NOTE — Therapy (Signed)
[facility] [address], [place], [address] Phone: [phone]   Fax:  [phone]  Physical Therapy Treatment  Patient Details  Name: Jane Willis MRN: [id number] Date of Birth: [birth year] Referring Provider (PT): Allene Dillon, NP   Encounter Date: 09/30/2021   PT End of Session - 09/30/21 1522     Visit Number 9    Number of Visits 24    Date for PT Re-Evaluation 11/18/21    Authorization Type [employer] MEDICARE reporting period from 08/26/2021    Progress Note Due on Visit 10    PT Start Time 1517    PT Stop Time 1555    PT Time Calculation (min) 38 min    Activity Tolerance Patient tolerated treatment well    Behavior During Therapy [facility] for tasks assessed/performed             Past Medical History:  Diagnosis Date   Allergy    Seasonal   Arthritis    Chicken pox    Hyperlipidemia    Hypertension    Urinary incontinence     Past Surgical History:  Procedure Laterality Date   ABDOMINAL HYSTERECTOMY  1986   BREAST EXCISIONAL BIOPSY Left 1970's   benign   BREAST SURGERY  1968   CATARACT EXTRACTION  2014/2015   Both eyes   FRACTURE SURGERY  01/2009   Fractured Left ankle, plate on outside of ankle 2 rods through/across ankle from inside of ankle   OOPHORECTOMY     TONSILLECTOMY AND ADENOIDECTOMY      There were no vitals filed for this visit.   Subjective Assessment - 09/30/21 1518     Subjective Patient reports 1/10 pain at the sacrum upon arrival today. She states she thinks the seated rows and the nustep machine help her. She would like to continue with exercise after she finishes with PT. States she felt better after last PT session. She reports her HEP is going okay. She has been doing her LTR, marching with PPT, and standing exercise. She think she is getting better at the pelvic tilt.    Pertinent History Patient is a 80 y.o. female who presents to outpatient physical therapy  with a referral for medical diagnosis lumbar stenosis with neurogenic claudication, lumbar radiculitis, lumbar degenerative disc disease with note from referring clinician for stabilization, balance/falls. This patient's chief complaints consist of pain in B low back, B buttocks, and posterior proximal thighs leading to the following functional deficits: slows her down with usual activities such as housework, prolonged standing, balance, stairs, getting up and down from the chair or in and out of the car..  Relevant past medical history and comorbidities include recent fall with subdural hematoma (08/05/2021), HTN, osteoporosis, L ankle fracture with ORIF (2010).  Patient denies hx of cancer, stroke, seizures, lung problem, major cardiac events, diabetes, unexplained weight loss, changes in bowel or bladder problems, new onset stumbling or dropping things, spinal surgeries.    Limitations House hold activities;Standing;Walking    Diagnostic tests MRI report from 07/09/2021: "Alignment: Dextrocurvature of the lumbar spine. 2 mm retrolisthesis  T12 on L1 and L1 on L2. Grade 1 anterolisthesis L4 on L5 and L5 on  S1."  "IMPRESSION:  1. L4-L5 severe spinal canal stenosis, with severe left and moderate  right neural foraminal narrowing.  2. L1-L2 mild spinal canal stenosis with severe right and mild left  neural foraminal narrowing.  3. L3-L4 and L5-S1 moderate right neural  foraminal narrowing.  4. Effacement of the lateral recesses bilaterally at L4-L5 and on  the left at L2-L3 and L3-L4, which may affect the descending left L3  and L4 nerves and the descending bilateral L5 nerves.  5. 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    Patient Stated Goals would like to learn how to alleviate some of her pain, would like to have better pain control with less medication, get more stability in her  trunk, (Feels like she is shrinking).    Currently in Pain? Yes    Pain Score 1     Pain Onset More than a month ago               TREATMENT:  +osteoporosis +recent subdural hematoma from contrecoup injury (08/05/21) Precautions from neurology per pt: no squats, stoops, or lifting over 10#   Therapeutic exercise: to centralize symptoms and improve ROM, strength, muscular endurance, and activity tolerance required for successful completion of functional activities.  - NuStep level 4 using lower extremities. Seat setting 8. For improved extremity mobility, muscular endurance, and activity tolerance; and to induce the analgesic effect of aerobic exercise, stimulate improved joint nutrition, and prepare body structures and systems for following interventions. x 5 minutes. Average SPM = 81. - OMEGA machine hamstring curls, B LE, 3x10 at 25# (medium difficulty, 30 second rests, pain at posterior left thigh during reps) - OMEGA machine knee extension, B LE, 3x10 at 20#. (30 second rests, Pain at left lateral posterior thigh). - hooklying PPT with march (each rep), 1x10 each side (cuing to keep tilt until she puts foot down).   - seated PPT on theraball with hands on counter in front of pt, elbows straight (to prevent upper back compensation), SBA, 1x20 (attempted without hands and unable).  - seated PPT on theraball with hands on knees (starts to lose form) SBA, 1x20 - seated PPT on theraball with sit <> stand with BUE support on counter, 2x10. (PT keeps theraball appropriately behind patient when she is standing).  - seated (on theraball) single arm rows with 10# cable, 3x10 each side. SBA    Pt required multimodal cuing for proper technique and to facilitate improved neuromuscular control, strength, range of motion, and functional ability resulting in improved performance and form.   HOME EXERCISE PROGRAM Access Code: [email or web address] URL: [email or web address] Date:  09/25/2021 Prepared by: Rosita Kea   Exercises Supine Lower Trunk Rotation - 1 x daily - 1 sets - 20 reps Supine March with Posterior Pelvic Tilt - 1 x daily - 3 sets - 10 reps Seated Slump Nerve Glide - 2 x daily - 1 sets - 15 reps Diagonal Hip Extension - 1 x daily - 2 sets - 10 reps - 5 seconds hold Seated Pelvic Tilt - 1 x daily - 1 sets - 20 reps - 5 seconds hold    PT Education - 09/30/21 1520     Education Details Exercise purpose/form. Self management techniques    Person(s) Educated Patient    Methods Explanation;Demonstration;Tactile cues;Verbal cues    Comprehension Verbalized understanding;Returned demonstration;Verbal cues required;Tactile cues required;Need further instruction              PT Short Term Goals - 09/16/21 1517       PT SHORT TERM GOAL #1   Title Be independent with initial home exercise program for  self-management of symptoms.    Baseline Initial HEP to be provided at visit 2 as appropriate (08/26/2021);    Time 2    Period Weeks    Status Achieved    Target Date 09/09/21               PT Long Term Goals - 08/26/21 1853       PT LONG TERM GOAL #1   Title Be independent with a long-term home exercise program for self-management of symptoms.    Baseline Initial HEP to be provided at visit 2 as appropriate (08/26/2021);    Time 12    Period Weeks    Status New   TARGET DATE FOR ALL LONG TERM GOALS: 11/18/2021     PT LONG TERM GOAL #2   Title Demonstrate improved FOTO to equal or greater than 69 by visit #10 to demonstrate improvement in overall condition and self-reported functional ability.    Baseline 60 (08/26/2021);    Time 12    Period Weeks    Status New      PT LONG TERM GOAL #3   Title Reduce pain with functional activities to equal or less than 1/10 to allow patient to complete usual activities including housework, stairs, standing  with less difficulty.    Baseline up to 10/10 (08/26/2021);    Time 12    Period Weeks     Status New      PT LONG TERM GOAL #4   Title Patient will ambulate equal or greater than 1000 feet during 6 Minute Walk Test to demonstrate improved community mobilty.    Baseline to be tested visit 2 (08/26/2021);    Time 12    Period Weeks    Status New      PT LONG TERM GOAL #5   Title Improve B hip strength to equal or greater than 4+/5 with no increase in pain to improve patients ability to complete functional tasks such as hosuework, stairs, community activity  with less difficulty.    Baseline as low as 3+/5 and painful - see objective exam (08/26/2021);    Time 12    Period Weeks    Status New      Additional Long Term Goals   Additional Long Term Goals Yes      PT LONG TERM GOAL #6   Title Patient will score equal or greater than 25/30 on Functional Gait Assessment to demonstrate low fall risk.    Baseline To be tested visit 2 as appropriate (08/26/2021);    Time 12    Period Weeks    Status New                   Plan - 09/30/21 1531     Clinical Impression Statement Patient tolerated treatment well and continues to report decreased pain with exercises performed in lumbar flexion. Continue to incorporate motor control and conditioning exercises for lumbopelvic control to allow patient to better mitigate pain with position. Patient would benefit from continued management of limiting condition by skilled physical therapist to address remaining impairments and functional limitations to work towards stated goals and return to PLOF or maximal functional independence.    Personal Factors and Comorbidities Age;Past/Current Experience    Examination-Activity Limitations Squat;Stairs;Lift;Stand;Carry    Examination-Participation Restrictions Interpersonal Relationship;Meal Prep;Laundry;Cleaning;Shop;Community Activity   slows her down with usual activities such as housework, prolonged standing, balance, stairs, getting up and down from the chair or in and out of the  car.    Stability/Clinical Decision Making Stable/Uncomplicated    Rehab Potential Good    PT Frequency 2x / week    PT Duration 12 weeks    PT Treatment/Interventions ADLs/Self Care Home Management;Cryotherapy;Electrical Stimulation;Moist Heat;Gait training;Functional mobility training;Therapeutic activities;Therapeutic exercise;Balance training;Neuromuscular re-education;Manual techniques;Dry needling;Passive range of motion;Patient/family education;Energy conservation    PT Next Visit Plan core/hip/functional strength    PT Home Exercise Plan Medbridge Access Code: [email or web address]    Consulted and Agree with Plan of Care Patient             Patient will benefit from skilled therapeutic intervention in order to improve the following deficits and impairments:  Abnormal gait, Decreased cognition, Pain, Postural dysfunction, Decreased mobility, Decreased activity tolerance, Decreased endurance, Decreased strength, Impaired perceived functional ability, Decreased balance, Difficulty walking, Other (comment) (hostory of fall)  Visit Diagnosis: Chronic bilateral low back pain with bilateral sciatica  History of falling  Muscle weakness (generalized)     Problem List Patient Active Problem List   Diagnosis Date Noted   Subdural hematoma 08/06/2021   History of COVID-19 03/01/2020   Prediabetes 07/06/2018   Age-related osteoporosis without current pathological fracture 12/29/2017   B12 deficiency 12/29/2017   Subcutaneous nodule 06/29/2017   Allergic rhinitis 03/19/2017   Family history of colon cancer 02/11/2017   Hypertension 12/31/2016   Hyperlipidemia 07/03/2016   History of low impact fracture of ankle 07/03/2016   Bilateral finger numbness 04/02/2016   Everlean Alstrom. Graylon Good, PT, DPT 09/30/21, 3:58 PM  [facility] [address] [place], [address] Phone: [phone]   Fax:  [phone]  Name: Jane Willis MRN:  [id number] Date of Birth: 1941/07/14

## 2021-10-02 ENCOUNTER — Encounter: Payer: Self-pay | Admitting: Physical Therapy

## 2021-10-02 ENCOUNTER — Ambulatory Visit: Payer: Medicare Other | Admitting: Physical Therapy

## 2021-10-02 DIAGNOSIS — M5442 Lumbago with sciatica, left side: Secondary | ICD-10-CM

## 2021-10-02 DIAGNOSIS — Z9181 History of falling: Secondary | ICD-10-CM

## 2021-10-02 DIAGNOSIS — M6281 Muscle weakness (generalized): Secondary | ICD-10-CM

## 2021-10-02 NOTE — Therapy (Signed)
[facility] [address], [place], [address] Phone: [phone]   Fax:  [phone]  Physical Therapy Treatment  Patient Details  Name: Jane Willis MRN: [id number] Date of Birth: 12/17/1940 Referring Provider (PT): Allene Dillon, NP   Encounter Date: 10/02/2021   PT End of Session - 10/02/21 1426     Visit Number 10    Number of Visits 24    Date for PT Re-Evaluation 11/18/21    Authorization Type [employer] MEDICARE reporting period from 08/26/2021    Progress Note Due on Visit 10    PT Start Time 1350    PT Stop Time 1430    PT Time Calculation (min) 40 min    Activity Tolerance Patient tolerated treatment well    Behavior During Therapy [facility] for tasks assessed/performed             Past Medical History:  Diagnosis Date   Allergy    Seasonal   Arthritis    Chicken pox    Hyperlipidemia    Hypertension    Urinary incontinence     Past Surgical History:  Procedure Laterality Date   ABDOMINAL HYSTERECTOMY  1986   BREAST EXCISIONAL BIOPSY Left 1970's   benign   BREAST SURGERY  1968   CATARACT EXTRACTION  2014/2015   Both eyes   FRACTURE SURGERY  01/2009   Fractured Left ankle, plate on outside of ankle 2 rods through/across ankle from inside of ankle   OOPHORECTOMY     TONSILLECTOMY AND ADENOIDECTOMY      There were no vitals filed for this visit.    Subjective Assessment - 10/02/21 1352     Subjective Pateint repots she has 2/10 pain in bilateral buttocks and proximal posterior thigh. She states she feels PT helping but she still has pain, although it is improving. She is still bothered by pain with walking. She has noticed everyday things have become easier to do. Patient reports her highest pain in the last 2 weeks has been 3-4/10.    Pertinent History Patient is a 80 y.o. female who presents to outpatient physical therapy with a referral for medical diagnosis lumbar stenosis with  neurogenic claudication, lumbar radiculitis, lumbar degenerative disc disease with note from referring clinician for stabilization, balance/falls. This patient's chief complaints consist of pain in B low back, B buttocks, and posterior proximal thighs leading to the following functional deficits: slows her down with usual activities such as housework, prolonged standing, balance, stairs, getting up and down from the chair or in and out of the car..  Relevant past medical history and comorbidities include recent fall with subdural hematoma (08/05/2021), HTN, osteoporosis, L ankle fracture with ORIF (2010).  Patient denies hx of cancer, stroke, seizures, lung problem, major cardiac events, diabetes, unexplained weight loss, changes in bowel or bladder problems, new onset stumbling or dropping things, spinal surgeries.    Limitations House hold activities;Standing;Walking    Diagnostic tests MRI report from 07/09/2021: "Alignment: Dextrocurvature of the lumbar spine. 2 mm retrolisthesis  T12 on L1 and L1 on L2. Grade 1 anterolisthesis L4 on L5 and L5 on  S1."  "IMPRESSION:  1. L4-L5 severe spinal canal stenosis, with severe left and moderate  right neural foraminal narrowing.  2. L1-L2 mild spinal canal stenosis with severe right and mild left  neural foraminal narrowing.  3. L3-L4 and L5-S1 moderate right neural foraminal narrowing.  4. Effacement of the lateral recesses bilaterally at L4-L5  and on  the left at L2-L3 and L3-L4, which may affect the descending left L3  and L4 nerves and the descending bilateral L5 nerves.  5. 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    Patient Stated Goals would like to learn how to alleviate some of her pain, would like to have better pain control with less medication, get more stability in her trunk, (Feels like she is shrinking).    Currently in  Pain? Yes    Pain Score 2     Pain Onset More than a month ago                [facility] PT Assessment - 10/02/21 0001       Assessment   Medical Diagnosis lumbar stenosis with neurogenic claudication, lumbar radiculitis, lumbar degenerative disc disease    Referring Provider (PT) Allene Dillon, NP    Onset Date/Surgical Date --   at least months ago   Hand Dominance Right    Next MD Visit end of December    Prior Therapy no      Precautions   Precautions Other (comment)   not supposed to pick up anything more than 5 pounds or squat while she is healing from her recent fall. No timetable provided.     Home Environment   Living Environment --   no concerns about getting around her home safely     Prior Function   Level of Independence Independent      Cognition   Overall Cognitive Status --   appears anxious; needs prolonged time to explain and respond to questions at times     Functional Gait  Assessment   Gait assessed  Yes    Gait Level Surface Walks 20 ft in less than 5.5 sec, no assistive devices, good speed, no evidence for imbalance, normal gait pattern, deviates no more than 6 in outside of the 12 in walkway width.   Fast: 4.41 seconds, Self-selected: 6.79 seconds.   Change in Gait Speed Able to smoothly change walking speed without loss of balance or gait deviation. Deviate no more than 6 in outside of the 12 in walkway width.    Gait with Horizontal Head Turns Performs head turns smoothly with slight change in gait velocity (eg, minor disruption to smooth gait path), deviates 6-10 in outside 12 in walkway width, or uses an assistive device.    Gait with Vertical Head Turns Performs head turns with no change in gait. Deviates no more than 6 in outside 12 in walkway width.    Gait and Pivot Turn Pivot turns safely within 3 sec and stops quickly with no loss of balance.    Step Over Obstacle Is able to step over 2 stacked shoe boxes taped together (9 in total height) without  changing gait speed. No evidence of imbalance.    Gait with Narrow Base of Support Ambulates less than 4 steps heel to toe or cannot perform without assistance.   high level of genuvalgus complicates ability to get feet in line   Gait with Eyes Closed Walks 20 ft, slow speed, abnormal gait pattern, evidence for imbalance, deviates 10-15 in outside 12 in walkway width. Requires more than 9 sec to ambulate 20 ft.   tends to veer right in 2nd 10 feet   Ambulating Backwards Walks 20 ft, uses assistive device,  slower speed, mild gait deviations, deviates 6-10 in outside 12 in walkway width.    Steps Alternating feet, must use rail.    Total Score 22    FGA comment: 19-24 = medium risk fall             OBJECTIVE  SELF-REPORTED FUNCTION FOTO score: 61/100 (lumbar spine questionnaire)  MUSCLE PERFORMANCE (MMT):  *Indicates pain 08/26/21 10/02/21 Date  Joint/Motion R/L R/L R/L  Hip        Flexion (L1, L2) 3+/3+ 4/4* /  Extension (knee ext) 4/4+ 4+*/4* /  Abduction 3+*/4* 4/4+ /  Knee        Extension (L3) 5/5 / /  Flexion (S2) 4+/5 / /  Ankle/Foot        Dorsiflexion (L4) 5/4+ / /  Great toe extension (L5) 4+/4+ / /  Eversion (S1) 5/5 / /  Plantarflexion (S1) 5/4+ / /  Comments:  08/26/2021: able to heel and toe walk bilaterally (with low clearance).  10/02/2021: pain with hip flexion in low back, pain with hip extension at proximal posterior femur.    FUNCTIONAL/BALANCE TESTS 6 Minute Walk Test: 1276 feet no assistive device. Pain lessened.  Functional Gait Assessment: 22/30 medium fall risk 10 Meter Walk Trial: Self-selected speed: 0.88 m/s; fast speed: 1.36 m/s.  TREATMENT:  +osteoporosis +recent subdural hematoma from contrecoup injury (08/05/21) Precautions from neurology per pt: no squats, stoops, or lifting over 10#   Therapeutic exercise: to centralize symptoms and improve ROM, strength, muscular endurance, and activity tolerance required for successful completion of  functional activities.  - ambulation around room for distance in 6 min (see 6MWT above) - Functional gait assessment to assess progress/fall risk - see score above - MMT to assess progress (see above).    Pt required multimodal cuing for proper technique and to facilitate improved neuromuscular control, strength, range of motion, and functional ability resulting in improved performance and form.   HOME EXERCISE PROGRAM Access Code: [email or web address] URL: [email or web address] Date: 09/25/2021 Prepared by: Rosita Kea   Exercises Supine Lower Trunk Rotation - 1 x daily - 1 sets - 20 reps Supine March with Posterior Pelvic Tilt - 1 x daily - 3 sets - 10 reps Seated Slump Nerve Glide - 2 x daily - 1 sets - 15 reps Diagonal Hip Extension - 1 x daily - 2 sets - 10 reps - 5 seconds hold Seated Pelvic Tilt - 1 x daily - 1 sets - 20 reps - 5 seconds hold       PT Education - 10/02/21 1640     Education Details Exercise purpose/form. Self management techniques. progress. POC    Person(s) Educated Patient    Methods Explanation;Demonstration;Tactile cues;Verbal cues    Comprehension Verbalized understanding;Returned demonstration;Verbal cues required;Tactile cues required;Need further instruction              PT Short Term Goals - 09/16/21 1517       PT SHORT TERM GOAL #1   Title Be independent with initial home exercise program for self-management of symptoms.    Baseline Initial HEP to be provided at visit 2 as appropriate (08/26/2021);    Time 2    Period Weeks    Status Achieved    Target Date 09/09/21               PT Long Term Goals - 10/02/21 1358       PT LONG TERM GOAL #1   Title Be independent with a long-term home  exercise program for self-management of symptoms.    Baseline Initial HEP to be provided at visit 2 as appropriate (08/26/2021); Patient reports good participation in appropriate HEP for current level of improvement (10/02/2021):    Time 12     Period Weeks    Status Partially Met   TARGET DATE FOR ALL LONG TERM GOALS: 11/18/2021     PT LONG TERM GOAL #2   Title Demonstrate improved FOTO to equal or greater than 69 by visit #10 to demonstrate improvement in overall condition and self-reported functional ability.    Baseline 60 (08/26/2021); 61 at visit #10 (10/02/2021);    Time 12    Period Weeks    Status On-going      PT LONG TERM GOAL #3   Title Reduce pain with functional activities to equal or less than 1/10 to allow patient to complete usual activities including housework, stairs, standing  with less difficulty.    Baseline up to 10/10 (08/26/2021); 3-4/10 (10/02/21);    Time 12    Period Weeks    Status Partially Met      PT LONG TERM GOAL #4   Title Patient will ambulate equal or greater than 1000 feet during 6 Minute Walk Test to demonstrate improved community mobilty.    Baseline to be tested visit 2 (08/26/2021);  1160 feet no assistive device. Pain moved out of lower buttocks (08/28/2021); 1276 feet no assistive device (10/02/2021);    Time 12    Period Weeks    Status Achieved      PT LONG TERM GOAL #5   Title Improve B hip strength to equal or greater than 4+/5 with no increase in pain to improve patients ability to complete functional tasks such as hosuework, stairs, community activity  with less difficulty.    Baseline as low as 3+/5 and painful - see objective exam (08/26/2021); improevd to 4/5 or higher but some still painful - see objective (10/02/2021);    Time 12    Period Weeks    Status Partially Met      PT LONG TERM GOAL #6   Title Patient will score equal or greater than 25/30 on Functional Gait Assessment to demonstrate low fall risk.    Baseline To be tested visit 2 as appropriate (08/26/2021); 20/30 medium fall risk (08/28/2021); 22/30 medium fall risk (10/02/2021);    Time 12    Period Weeks    Status Partially Met                   Plan - 10/02/21 1640     Clinical Impression  Statement Patient has attended 10 physical therapy sessions this episode of care since starting on 08/26/2021. Patient reports the most significant improvement in pain since starting PT. She also demonstrates improvements in 6 Minute Walk Test, Functional Gait Assessment, and hip strength. Patient shows only marginal improvement in FOTO (self-reported function) but states she is unsure if the questions asked accurately reflected her abilities. Patient continues to report pain in the proximal posterior thigh, buttock region, and lower back and would benefit from continued PT to help her learn better lumbopelvic control and improve functional activity tolerance and safety. Patient would benefit from continued management of limiting condition by skilled physical therapist to address remaining impairments and functional limitations to work towards stated goals and return to PLOF or maximal functional independence.    Personal Factors and Comorbidities Age;Past/Current Experience    Examination-Activity Limitations Squat;Stairs;Lift;Stand;Carry    Examination-Participation  Restrictions Interpersonal Relationship;Meal Prep;Laundry;Cleaning;Shop;Community Activity   slows her down with usual activities such as housework, prolonged standing, balance, stairs, getting up and down from the chair or in and out of the car.   Stability/Clinical Decision Making Stable/Uncomplicated    Rehab Potential Good    PT Frequency 2x / week    PT Duration 12 weeks    PT Treatment/Interventions ADLs/Self Care Home Management;Cryotherapy;Electrical Stimulation;Moist Heat;Gait training;Functional mobility training;Therapeutic activities;Therapeutic exercise;Balance training;Neuromuscular re-education;Manual techniques;Dry needling;Passive range of motion;Patient/family education;Energy conservation    PT Next Visit Plan core/hip/functional strength    PT Home Exercise Plan Medbridge Access Code: [email or web address]    Consulted and Agree with  Plan of Care Patient             Patient will benefit from skilled therapeutic intervention in order to improve the following deficits and impairments:  Abnormal gait, Decreased cognition, Pain, Postural dysfunction, Decreased mobility, Decreased activity tolerance, Decreased endurance, Decreased strength, Impaired perceived functional ability, Decreased balance, Difficulty walking, Other (comment) (hostory of fall)  Visit Diagnosis: Chronic bilateral low back pain with bilateral sciatica  History of falling  Muscle weakness (generalized)     Problem List Patient Active Problem List   Diagnosis Date Noted   Subdural hematoma 08/06/2021   History of COVID-19 03/01/2020   Prediabetes 07/06/2018   Age-related osteoporosis without current pathological fracture 12/29/2017   B12 deficiency 12/29/2017   Subcutaneous nodule 06/29/2017   Allergic rhinitis 03/19/2017   Family history of colon cancer 02/11/2017   Hypertension 12/31/2016   Hyperlipidemia 07/03/2016   History of low impact fracture of ankle 07/03/2016   Bilateral finger numbness 04/02/2016    Everlean Alstrom. Graylon Good, PT, DPT 10/02/21, 4:42 PM   [facility] [address] [place], [address] Phone: [phone]   Fax:  [phone]  Name: Jane Willis MRN: [id number] Date of Birth: 07-Jun-1941

## 2021-10-06 ENCOUNTER — Ambulatory Visit: Payer: Medicare Other | Admitting: Physical Therapy

## 2021-10-06 ENCOUNTER — Encounter: Payer: Self-pay | Admitting: Physical Therapy

## 2021-10-06 DIAGNOSIS — M5442 Lumbago with sciatica, left side: Secondary | ICD-10-CM

## 2021-10-06 DIAGNOSIS — M6281 Muscle weakness (generalized): Secondary | ICD-10-CM

## 2021-10-06 DIAGNOSIS — G8929 Other chronic pain: Secondary | ICD-10-CM

## 2021-10-06 DIAGNOSIS — Z9181 History of falling: Secondary | ICD-10-CM

## 2021-10-06 NOTE — Therapy (Signed)
[facility] [address], [place], [address] Phone: [phone]   Fax:  [phone]  Physical Therapy Treatment  Patient Details  Name: RAMONIA MCCLARAN MRN: [id number] Date of Birth: May 25, 1941 Referring Provider (PT): Allene Dillon, NP   Encounter Date: 10/06/2021   PT End of Session - 10/06/21 1607     Visit Number 11    Number of Visits 24    Date for PT Re-Evaluation 11/18/21    Authorization Type [employer] MEDICARE reporting period from 10/02/2021    Progress Note Due on Visit 10    PT Start Time 1602    PT Stop Time 1642    PT Time Calculation (min) 40 min    Activity Tolerance Patient tolerated treatment well    Behavior During Therapy [facility] for tasks assessed/performed             Past Medical History:  Diagnosis Date   Allergy    Seasonal   Arthritis    Chicken pox    Hyperlipidemia    Hypertension    Urinary incontinence     Past Surgical History:  Procedure Laterality Date   ABDOMINAL HYSTERECTOMY  1986   BREAST EXCISIONAL BIOPSY Left 1970's   benign   BREAST SURGERY  1968   CATARACT EXTRACTION  2014/2015   Both eyes   FRACTURE SURGERY  01/2009   Fractured Left ankle, plate on outside of ankle 2 rods through/across ankle from inside of ankle   OOPHORECTOMY     TONSILLECTOMY AND ADENOIDECTOMY      There were no vitals filed for this visit.   Subjective Assessment - 10/06/21 1606     Subjective Patient reports she is feeling well today and her pain is low. She rates it 1/10 pain in the proximal posterior thighs L> R.    Pertinent History Patient is a 80 y.o. female who presents to outpatient physical therapy with a referral for medical diagnosis lumbar stenosis with neurogenic claudication, lumbar radiculitis, lumbar degenerative disc disease with note from referring clinician for stabilization, balance/falls. This patient's chief complaints consist of pain in B low back, B  buttocks, and posterior proximal thighs leading to the following functional deficits: slows her down with usual activities such as housework, prolonged standing, balance, stairs, getting up and down from the chair or in and out of the car..  Relevant past medical history and comorbidities include recent fall with subdural hematoma (08/05/2021), HTN, osteoporosis, L ankle fracture with ORIF (2010).  Patient denies hx of cancer, stroke, seizures, lung problem, major cardiac events, diabetes, unexplained weight loss, changes in bowel or bladder problems, new onset stumbling or dropping things, spinal surgeries.    Limitations House hold activities;Standing;Walking    Diagnostic tests MRI report from 07/09/2021: "Alignment: Dextrocurvature of the lumbar spine. 2 mm retrolisthesis  T12 on L1 and L1 on L2. Grade 1 anterolisthesis L4 on L5 and L5 on  S1."  "IMPRESSION:  1. L4-L5 severe spinal canal stenosis, with severe left and moderate  right neural foraminal narrowing.  2. L1-L2 mild spinal canal stenosis with severe right and mild left  neural foraminal narrowing.  3. L3-L4 and L5-S1 moderate right neural foraminal narrowing.  4. Effacement of the lateral recesses bilaterally at L4-L5 and on  the left at L2-L3 and L3-L4, which may affect the descending left L3  and L4 nerves and the descending bilateral L5 nerves.  5. 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    Patient Stated Goals would like to learn how to alleviate some of her pain, would like to have better pain control with less medication, get more stability in her trunk, (Feels like she is shrinking).    Currently in Pain? Yes    Pain Score 1     Pain Onset More than a month ago              TREATMENT:  +osteoporosis +recent subdural hematoma from contrecoup injury (08/05/21) Precautions from neurology per pt: no  squats, stoops, or lifting over 10#   Therapeutic exercise: to centralize symptoms and improve ROM, strength, muscular endurance, and activity tolerance required for successful completion of functional activities.  - NuStep level 4 using lower extremities. Seat setting 8. For improved extremity mobility, muscular endurance, and activity tolerance; and to induce the analgesic effect of aerobic exercise, stimulate improved joint nutrition, and prepare body structures and systems for following interventions. x 5 minutes. Average SPM = 86. - OMEGA machine hamstring curls, B LE, 3x10 at 25# (medium difficulty, 30 second rests - OMEGA machine knee extension, B LE, 3x10 at 25#. (30 second rests). - hooklying PPT with march (each rep), 1x10 each side (good carry over).  - hooklying PPT with marching (one on each side before relaxing PPT), 1x10 each side with cues for improved breathing.  - hooklying PPT with marching (continuous), 2x5 each side with cues for improved breathing.  - seated PPT on theraball with hands on counter in front of pt, elbows straight (to prevent upper back compensation), SBA, 1x20  - seated PPT on theraball with sit <> stand with BUE support on counter, 2x10. (PT keeps theraball appropriately behind patient when she is standing).  - seated (on theraball) single arm rows with 10# cable, 3x10 each side. SBA    Pt required multimodal cuing for proper technique and to facilitate improved neuromuscular control, strength, range of motion, and functional ability resulting in improved performance and form.   HOME EXERCISE PROGRAM Access Code: [email or web address] URL: [email or web address] Date: 09/25/2021 Prepared by: Rosita Kea   Exercises Supine Lower Trunk Rotation - 1 x daily - 1 sets - 20 reps Supine March with Posterior Pelvic Tilt - 1 x daily - 3 sets - 10 reps Seated Slump Nerve Glide - 2 x daily - 1 sets - 15 reps Diagonal Hip Extension - 1 x daily - 2 sets - 10 reps - 5  seconds hold Seated Pelvic Tilt - 1 x daily - 1 sets - 20 reps - 5 seconds hold     PT Education - 10/06/21 1607     Education Details Exercise purpose/form. Self management techniques    Person(s) Educated Patient    Methods Explanation;Demonstration;Tactile cues;Verbal cues    Comprehension Verbalized understanding;Returned demonstration;Verbal cues required;Tactile cues required;Need further instruction              PT Short Term Goals - 09/16/21 1517       PT SHORT TERM GOAL #1   Title Be independent with initial home exercise program for self-management of symptoms.    Baseline Initial HEP to be provided at visit 2 as appropriate (08/26/2021);    Time 2    Period Weeks    Status Achieved    Target Date 09/09/21  PT Long Term Goals - 10/02/21 1358       PT LONG TERM GOAL #1   Title Be independent with a long-term home exercise program for self-management of symptoms.    Baseline Initial HEP to be provided at visit 2 as appropriate (08/26/2021); Patient reports good participation in appropriate HEP for current level of improvement (10/02/2021):    Time 12    Period Weeks    Status Partially Met   TARGET DATE FOR ALL LONG TERM GOALS: 11/18/2021     PT LONG TERM GOAL #2   Title Demonstrate improved FOTO to equal or greater than 69 by visit #10 to demonstrate improvement in overall condition and self-reported functional ability.    Baseline 60 (08/26/2021); 61 at visit #10 (10/02/2021);    Time 12    Period Weeks    Status On-going      PT LONG TERM GOAL #3   Title Reduce pain with functional activities to equal or less than 1/10 to allow patient to complete usual activities including housework, stairs, standing  with less difficulty.    Baseline up to 10/10 (08/26/2021); 3-4/10 (10/02/21);    Time 12    Period Weeks    Status Partially Met      PT LONG TERM GOAL #4   Title Patient will ambulate equal or greater than 1000 feet during 6 Minute Walk  Test to demonstrate improved community mobilty.    Baseline to be tested visit 2 (08/26/2021);  1160 feet no assistive device. Pain moved out of lower buttocks (08/28/2021); 1276 feet no assistive device (10/02/2021);    Time 12    Period Weeks    Status Achieved      PT LONG TERM GOAL #5   Title Improve B hip strength to equal or greater than 4+/5 with no increase in pain to improve patients ability to complete functional tasks such as hosuework, stairs, community activity  with less difficulty.    Baseline as low as 3+/5 and painful - see objective exam (08/26/2021); improevd to 4/5 or higher but some still painful - see objective (10/02/2021);    Time 12    Period Weeks    Status Partially Met      PT LONG TERM GOAL #6   Title Patient will score equal or greater than 25/30 on Functional Gait Assessment to demonstrate low fall risk.    Baseline To be tested visit 2 as appropriate (08/26/2021); 20/30 medium fall risk (08/28/2021); 22/30 medium fall risk (10/02/2021);    Time 12    Period Weeks    Status Partially Met                   Plan - 10/06/21 1615     Clinical Impression Statement Patient tolerated treatment well overall and continues to work towards improving lumbopelvic control and LE/core strength. Patient continues to report improving symptoms. She does have difficulty with mind muscle connection during lumbopelvic motor control/core exercises. Patient would benefit from continued management of limiting condition by skilled physical therapist to address remaining impairments and functional limitations to work towards stated goals and return to PLOF or maximal functional independence.     ?    Personal Factors and Comorbidities Age;Past/Current Experience    Examination-Activity Limitations Squat;Stairs;Lift;Stand;Carry    Examination-Participation Restrictions Interpersonal Relationship;Meal Prep;Laundry;Cleaning;Shop;Community Activity   slows her down with usual  activities such as housework, prolonged standing, balance, stairs, getting up and down from the chair or in and out  of the car.   Stability/Clinical Decision Making Stable/Uncomplicated    Rehab Potential Good    PT Frequency 2x / week    PT Duration 12 weeks    PT Treatment/Interventions ADLs/Self Care Home Management;Cryotherapy;Electrical Stimulation;Moist Heat;Gait training;Functional mobility training;Therapeutic activities;Therapeutic exercise;Balance training;Neuromuscular re-education;Manual techniques;Dry needling;Passive range of motion;Patient/family education;Energy conservation    PT Next Visit Plan core/hip/functional strength    PT Home Exercise Plan Medbridge Access Code: [email or web address]    Consulted and Agree with Plan of Care Patient             Patient will benefit from skilled therapeutic intervention in order to improve the following deficits and impairments:  Abnormal gait, Decreased cognition, Pain, Postural dysfunction, Decreased mobility, Decreased activity tolerance, Decreased endurance, Decreased strength, Impaired perceived functional ability, Decreased balance, Difficulty walking, Other (comment) (hostory of fall)  Visit Diagnosis: Chronic bilateral low back pain with bilateral sciatica  History of falling  Muscle weakness (generalized)     Problem List Patient Active Problem List   Diagnosis Date Noted   Subdural hematoma 08/06/2021   History of COVID-19 03/01/2020   Prediabetes 07/06/2018   Age-related osteoporosis without current pathological fracture 12/29/2017   B12 deficiency 12/29/2017   Subcutaneous nodule 06/29/2017   Allergic rhinitis 03/19/2017   Family history of colon cancer 02/11/2017   Hypertension 12/31/2016   Hyperlipidemia 07/03/2016   History of low impact fracture of ankle 07/03/2016   Bilateral finger numbness 04/02/2016    Everlean Alstrom. Graylon Good, PT, DPT 10/06/21, 7:43 PM  [facility] [address], [place], [address] Phone: [phone]   Fax:  [phone]  Name: KELLSIE GRINDLE MRN: [id number] Date of Birth: 1941/07/05

## 2021-10-09 ENCOUNTER — Ambulatory Visit: Payer: Medicare Other | Admitting: Physical Therapy

## 2021-10-09 ENCOUNTER — Encounter: Payer: Self-pay | Admitting: Physical Therapy

## 2021-10-09 DIAGNOSIS — M5442 Lumbago with sciatica, left side: Secondary | ICD-10-CM

## 2021-10-09 DIAGNOSIS — Z9181 History of falling: Secondary | ICD-10-CM

## 2021-10-09 DIAGNOSIS — M6281 Muscle weakness (generalized): Secondary | ICD-10-CM

## 2021-10-09 NOTE — Therapy (Signed)
[facility] [place] [address], [place], [address] Phone: [phone]   Fax:  [phone]  Physical Therapy Treatment  Patient Details  Name: Jane Willis MRN: [id number] Date of Birth: 02/24/1941 Referring Provider (PT): Allene Dillon, NP   Encounter Date: 10/09/2021   PT End of Session - 10/09/21 0956     Visit Number 12    Number of Visits 24    Date for PT Re-Evaluation 11/18/21    Authorization Type [employer] MEDICARE reporting period from 10/02/2021    Progress Note Due on Visit 10    PT Start Time 0950    PT Stop Time 1028    PT Time Calculation (min) 38 min    Activity Tolerance Patient tolerated treatment well    Behavior During Therapy [facility] for tasks assessed/performed             Past Medical History:  Diagnosis Date   Allergy    Seasonal   Arthritis    Chicken pox    Hyperlipidemia    Hypertension    Urinary incontinence     Past Surgical History:  Procedure Laterality Date   ABDOMINAL HYSTERECTOMY  1986   BREAST EXCISIONAL BIOPSY Left 1970's   benign   BREAST SURGERY  1968   CATARACT EXTRACTION  2014/2015   Both eyes   FRACTURE SURGERY  01/2009   Fractured Left ankle, plate on outside of ankle 2 rods through/across ankle from inside of ankle   OOPHORECTOMY     TONSILLECTOMY AND ADENOIDECTOMY      There were no vitals filed for this visit.   Subjective Assessment - 10/09/21 0954     Subjective Patient reports she is feeling well today. States she has 2/10 in her bilateral proximal posterior thighs L > R upon arrival. States she has been doing her squat at least 2x10 each day and she likes these. She states she "could tell I had been here" after her last PT session.    Pertinent History Patient is a 80 y.o. female who presents to outpatient physical therapy with a referral for medical diagnosis lumbar stenosis with neurogenic claudication, lumbar radiculitis, lumbar degenerative  disc disease with note from referring clinician for stabilization, balance/falls. This patient's chief complaints consist of pain in B low back, B buttocks, and posterior proximal thighs leading to the following functional deficits: slows her down with usual activities such as housework, prolonged standing, balance, stairs, getting up and down from the chair or in and out of the car..  Relevant past medical history and comorbidities include recent fall with subdural hematoma (08/05/2021), HTN, osteoporosis, L ankle fracture with ORIF (2010).  Patient denies hx of cancer, stroke, seizures, lung problem, major cardiac events, diabetes, unexplained weight loss, changes in bowel or bladder problems, new onset stumbling or dropping things, spinal surgeries.    Limitations House hold activities;Standing;Walking    Diagnostic tests MRI report from 07/09/2021: "Alignment: Dextrocurvature of the lumbar spine. 2 mm retrolisthesis  T12 on L1 and L1 on L2. Grade 1 anterolisthesis L4 on L5 and L5 on  S1."  "IMPRESSION:  1. L4-L5 severe spinal canal stenosis, with severe left and moderate  right neural foraminal narrowing.  2. L1-L2 mild spinal canal stenosis with severe right and mild left  neural foraminal narrowing.  3. L3-L4 and L5-S1 moderate right neural foraminal narrowing.  4. Effacement of the lateral recesses bilaterally at L4-L5 and on  the left at L2-L3 and  L3-L4, which may affect the descending left L3  and L4 nerves and the descending bilateral L5 nerves.  5. 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    Patient Stated Goals would like to learn how to alleviate some of her pain, would like to have better pain control with less medication, get more stability in her trunk, (Feels like she is shrinking).    Currently in Pain? Yes    Pain Score 2               TREATMENT:   +osteoporosis +recent subdural hematoma from contrecoup injury (08/05/21) Precautions from neurology per pt: no squats, stoops, or lifting over 10#   Therapeutic exercise: to centralize symptoms and improve ROM, strength, muscular endurance, and activity tolerance required for successful completion of functional activities.  - NuStep level 5 using lower extremities. Seat setting 8. For improved extremity mobility, muscular endurance, and activity tolerance; and to induce the analgesic effect of aerobic exercise, stimulate improved joint nutrition, and prepare body structures and systems for following interventions. x 5 minutes. Average SPM = 83 - OMEGA machine hamstring curls, B LE, 3x10 at 25# (medium difficulty, 30 second rests - OMEGA machine knee extension, B LE, 3x10 at 25#. (rated medium difficulty, 30 second rests). - seated PPT on theraball with hands on counter in front of pt, elbows straight (to prevent upper back compensation), SBA, 1x5  - seated PPT on theraball with sit <> stand with BUE support on counter, 3x10. (PT keeps theraball appropriately behind patient when she is standing).  - seated (on theraball) single arm rows with 10# cable, 3x10 each side. SBA    Manual therapy: to reduce pain and tissue tension, improve range of motion, neuromodulation, in order to promote improved ability to complete functional activities. - sidelying left hip flexor stretch, 3x30 seconds - sidelying right hip flexor stretch, attempted several times but unable to feel stretch in front of hip and had pain in posterior hip/back.  - supine right hip flexor stretch in gaenslen's position, 3x30 seconds.   Pt required multimodal cuing for proper technique and to facilitate improved neuromuscular control, strength, range of motion, and functional ability resulting in improved performance and form.   HOME EXERCISE PROGRAM Access Code: [email or web address] URL: [email or web address] Date:  09/25/2021 Prepared by: Rosita Kea   Exercises Supine Lower Trunk Rotation - 1 x daily - 1 sets - 20 reps Supine March with Posterior Pelvic Tilt - 1 x daily - 3 sets - 10 reps Seated Slump Nerve Glide - 2 x daily - 1 sets - 15 reps Diagonal Hip Extension - 1 x daily - 2 sets - 10 reps - 5 seconds hold Seated Pelvic Tilt - 1 x daily - 1 sets - 20 reps - 5 seconds hold    PT Education - 10/09/21 0955     Education Details Exercise purpose/form. Self management techniques    Person(s) Educated Patient    Methods Explanation;Demonstration;Tactile cues;Verbal cues    Comprehension Verbalized understanding;Returned demonstration;Verbal cues required;Tactile cues required;Need further instruction              PT Short Term Goals - 09/16/21 1517       PT SHORT TERM GOAL #1   Title Be independent with initial home exercise program for self-management of symptoms.    Baseline Initial HEP to be provided at  visit 2 as appropriate (08/26/2021);    Time 2    Period Weeks    Status Achieved    Target Date 09/09/21               PT Long Term Goals - 10/02/21 1358       PT LONG TERM GOAL #1   Title Be independent with a long-term home exercise program for self-management of symptoms.    Baseline Initial HEP to be provided at visit 2 as appropriate (08/26/2021); Patient reports good participation in appropriate HEP for current level of improvement (10/02/2021):    Time 12    Period Weeks    Status Partially Met   TARGET DATE FOR ALL LONG TERM GOALS: 11/18/2021     PT LONG TERM GOAL #2   Title Demonstrate improved FOTO to equal or greater than 69 by visit #10 to demonstrate improvement in overall condition and self-reported functional ability.    Baseline 60 (08/26/2021); 61 at visit #10 (10/02/2021);    Time 12    Period Weeks    Status On-going      PT LONG TERM GOAL #3   Title Reduce pain with functional activities to equal or less than 1/10 to allow patient to complete usual  activities including housework, stairs, standing  with less difficulty.    Baseline up to 10/10 (08/26/2021); 3-4/10 (10/02/21);    Time 12    Period Weeks    Status Partially Met      PT LONG TERM GOAL #4   Title Patient will ambulate equal or greater than 1000 feet during 6 Minute Walk Test to demonstrate improved community mobilty.    Baseline to be tested visit 2 (08/26/2021);  1160 feet no assistive device. Pain moved out of lower buttocks (08/28/2021); 1276 feet no assistive device (10/02/2021);    Time 12    Period Weeks    Status Achieved      PT LONG TERM GOAL #5   Title Improve B hip strength to equal or greater than 4+/5 with no increase in pain to improve patients ability to complete functional tasks such as hosuework, stairs, community activity  with less difficulty.    Baseline as low as 3+/5 and painful - see objective exam (08/26/2021); improevd to 4/5 or higher but some still painful - see objective (10/02/2021);    Time 12    Period Weeks    Status Partially Met      PT LONG TERM GOAL #6   Title Patient will score equal or greater than 25/30 on Functional Gait Assessment to demonstrate low fall risk.    Baseline To be tested visit 2 as appropriate (08/26/2021); 20/30 medium fall risk (08/28/2021); 22/30 medium fall risk (10/02/2021);    Time 12    Period Weeks    Status Partially Met                   Plan - 10/09/21 1034     Clinical Impression Statement Patient tolerated treatment well overall and continues to work on improving posterior pelvic tilt, abdominal stabilization, and LE strength. Patient would benefit from continued management of limiting condition by skilled physical therapist to address remaining impairments and functional limitations to work towards stated goals and return to PLOF or maximal functional independence.    Personal Factors and Comorbidities Age;Past/Current Experience    Examination-Activity Limitations  Squat;Stairs;Lift;Stand;Carry    Examination-Participation Restrictions Interpersonal Relationship;Meal Prep;Laundry;Cleaning;Shop;Community Activity   slows her down with usual  activities such as housework, prolonged standing, balance, stairs, getting up and down from the chair or in and out of the car.   Stability/Clinical Decision Making Stable/Uncomplicated    Rehab Potential Good    PT Frequency 2x / week    PT Duration 12 weeks    PT Treatment/Interventions ADLs/Self Care Home Management;Cryotherapy;Electrical Stimulation;Moist Heat;Gait training;Functional mobility training;Therapeutic activities;Therapeutic exercise;Balance training;Neuromuscular re-education;Manual techniques;Dry needling;Passive range of motion;Patient/family education;Energy conservation    PT Next Visit Plan core/hip/functional strength    PT Home Exercise Plan Medbridge Access Code: [email or web address]    Consulted and Agree with Plan of Care Patient             Patient will benefit from skilled therapeutic intervention in order to improve the following deficits and impairments:  Abnormal gait, Decreased cognition, Pain, Postural dysfunction, Decreased mobility, Decreased activity tolerance, Decreased endurance, Decreased strength, Impaired perceived functional ability, Decreased balance, Difficulty walking, Other (comment) (hostory of fall)  Visit Diagnosis: Chronic bilateral low back pain with bilateral sciatica  History of falling  Muscle weakness (generalized)     Problem List Patient Active Problem List   Diagnosis Date Noted   Subdural hematoma 08/06/2021   History of COVID-19 03/01/2020   Prediabetes 07/06/2018   Age-related osteoporosis without current pathological fracture 12/29/2017   B12 deficiency 12/29/2017   Subcutaneous nodule 06/29/2017   Allergic rhinitis 03/19/2017   Family history of colon cancer 02/11/2017   Hypertension 12/31/2016   Hyperlipidemia 07/03/2016   History of low impact  fracture of ankle 07/03/2016   Bilateral finger numbness 04/02/2016   Everlean Alstrom. Graylon Good, PT, DPT 10/09/21, 10:34 AM   [place] [facility] [address] [place], [address] Phone: [phone]   Fax:  [phone]  Name: Jane Willis MRN: [id number] Date of Birth: 1941/10/16

## 2021-10-15 ENCOUNTER — Ambulatory Visit: Payer: Medicare Other | Admitting: Physical Therapy

## 2021-10-21 ENCOUNTER — Ambulatory Visit: Payer: Medicare Other | Attending: Family Medicine | Admitting: Physical Therapy

## 2021-10-21 ENCOUNTER — Encounter: Payer: Self-pay | Admitting: Physical Therapy

## 2021-10-21 DIAGNOSIS — Z9181 History of falling: Secondary | ICD-10-CM | POA: Insufficient documentation

## 2021-10-21 DIAGNOSIS — M6281 Muscle weakness (generalized): Secondary | ICD-10-CM | POA: Insufficient documentation

## 2021-10-21 DIAGNOSIS — M5441 Lumbago with sciatica, right side: Secondary | ICD-10-CM | POA: Insufficient documentation

## 2021-10-21 DIAGNOSIS — G8929 Other chronic pain: Secondary | ICD-10-CM | POA: Insufficient documentation

## 2021-10-21 DIAGNOSIS — M5442 Lumbago with sciatica, left side: Secondary | ICD-10-CM | POA: Insufficient documentation

## 2021-10-21 NOTE — Therapy (Addendum)
[facility] [address], [place], [address] Phone: [phone]   Fax:  [phone]  Physical Therapy Treatment  Patient Details  Name: Jane Willis MRN: [id number] Date of Birth: 1941-10-17 Referring Provider (PT): Allene Dillon, NP   Encounter Date: 10/21/2021   PT End of Session - 10/21/21 1034     Visit Number 13    Number of Visits 24    Date for PT Re-Evaluation 11/18/21    Authorization Type [employer] MEDICARE reporting period from 10/02/2021    Progress Note Due on Visit 10    PT Start Time 1032    PT Stop Time 1110    PT Time Calculation (min) 38 min    Activity Tolerance Patient tolerated treatment well    Behavior During Therapy [facility] for tasks assessed/performed             Past Medical History:  Diagnosis Date   Allergy    Seasonal   Arthritis    Chicken pox    Hyperlipidemia    Hypertension    Urinary incontinence     Past Surgical History:  Procedure Laterality Date   ABDOMINAL HYSTERECTOMY  1986   BREAST EXCISIONAL BIOPSY Left 1970's   benign   BREAST SURGERY  1968   CATARACT EXTRACTION  2014/2015   Both eyes   FRACTURE SURGERY  01/2009   Fractured Left ankle, plate on outside of ankle 2 rods through/across ankle from inside of ankle   OOPHORECTOMY     TONSILLECTOMY AND ADENOIDECTOMY      There were no vitals filed for this visit.   Subjective Assessment - 10/21/21 1032     Subjective Patient reports she is feeling well but continues to have the same pain in her proximal posterior thighs. She rates it 3-4/10 currently. She had a lumbar injection last week which prevented her from coming to PT that week. So far she has not had any improvement but she was told it could take 2 weeks to feel an effect. She states she was extremely sore in the posterior proximal thigh after the new stretching last PT session and could hardly do anything for two days. She does not want to feel  like this again. She had an enjoyabe christmas celebration with her family. Patient states she has been consistent with hooklying marching and sit to stands at home.    Pertinent History Patient is a 81 y.o. female who presents to outpatient physical therapy with a referral for medical diagnosis lumbar stenosis with neurogenic claudication, lumbar radiculitis, lumbar degenerative disc disease with note from referring clinician for stabilization, balance/falls. This patient's chief complaints consist of pain in B low back, B buttocks, and posterior proximal thighs leading to the following functional deficits: slows her down with usual activities such as housework, prolonged standing, balance, stairs, getting up and down from the chair or in and out of the car..  Relevant past medical history and comorbidities include recent fall with subdural hematoma (08/05/2021), HTN, osteoporosis, L ankle fracture with ORIF (2010).  Patient denies hx of cancer, stroke, seizures, lung problem, major cardiac events, diabetes, unexplained weight loss, changes in bowel or bladder problems, new onset stumbling or dropping things, spinal surgeries.    Limitations House hold activities;Standing;Walking    Diagnostic tests MRI report from 07/09/2021: "Alignment: Dextrocurvature of the lumbar spine. 2 mm retrolisthesis  T12 on L1 and L1 on L2. Grade 1 anterolisthesis L4 on L5 and L5  on  S1."  "IMPRESSION:  1. L4-L5 severe spinal canal stenosis, with severe left and moderate  right neural foraminal narrowing.  2. L1-L2 mild spinal canal stenosis with severe right and mild left  neural foraminal narrowing.  3. L3-L4 and L5-S1 moderate right neural foraminal narrowing.  4. Effacement of the lateral recesses bilaterally at L4-L5 and on  the left at L2-L3 and L3-L4, which may affect the descending left L3  and L4 nerves and the descending bilateral L5 nerves.  5. 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    Patient Stated Goals would like to learn how to alleviate some of her pain, would like to have better pain control with less medication, get more stability in her trunk, (Feels like she is shrinking).    Currently in Pain? Yes    Pain Score 4                  TREATMENT:  +osteoporosis +recent subdural hematoma from contrecoup injury (08/05/21) Precautions from neurology per pt: no squats, stoops, or lifting over 10#   Therapeutic exercise: to centralize symptoms and improve ROM, strength, muscular endurance, and activity tolerance required for successful completion of functional activities.  - NuStep level 5 using lower extremities. Seat setting 8. For improved extremity mobility, muscular endurance, and activity tolerance; and to induce the analgesic effect of aerobic exercise, stimulate improved joint nutrition, and prepare body structures and systems for following interventions. x 5 minutes. Average SPM = 79 - OMEGA machine hamstring curls, B LE, 3x10 at 25# (medium difficulty, 30 second rests) - OMEGA machine knee extension, B LE, 3x10 at 25#. (rated medium difficulty, 30 second rests). - hooklying PPT with alternating LE extension, 3x10 each side.  - hooklying sequential leg march and lower, 2x10 alternating sides.  - supine/hooklying hamstring stretch with strap (assistance to get into position), 3x30 seconds each side. (Pain on down leg with it in extension so moved to hooklying).  - standing PPT with R row with red theraband loop, 1x15 (painful).  - seated (on chair) single arm rows with red TB loop, 3x10 each side.  - Education on HEP including handout    Pt required multimodal cuing for proper technique and to facilitate improved neuromuscular control, strength, range of motion, and functional ability resulting in improved performance and form.   HOME EXERCISE PROGRAM Access  Code: [email or web address] URL: [email or web address] Date: 10/21/2021 Prepared by: Rosita Kea  Exercises Hooklying Sequential Leg March and Lower - 1 x daily - 3 sets - 10 reps Squat with Chair and Counter Support - 1 x daily - 3 sets - 10 reps Seated Single Arm Shoulder Row with Anchored Resistance - 1 x daily - 3 sets - 10 reps     PT Education - 10/21/21 1034     Education Details Exercise purpose/form. Self management techniques    Person(s) Educated Patient    Methods Explanation;Demonstration;Tactile cues;Verbal cues    Comprehension Verbalized understanding;Returned demonstration;Verbal cues required;Tactile cues required;Need further instruction              PT Short Term Goals - 09/16/21 1517       PT SHORT TERM GOAL #1   Title Be independent with initial home exercise program for self-management of symptoms.    Baseline Initial HEP to be provided at visit 2 as appropriate (08/26/2021);  Time 2    Period Weeks    Status Achieved    Target Date 09/09/21               PT Long Term Goals - 10/02/21 1358       PT LONG TERM GOAL #1   Title Be independent with a long-term home exercise program for self-management of symptoms.    Baseline Initial HEP to be provided at visit 2 as appropriate (08/26/2021); Patient reports good participation in appropriate HEP for current level of improvement (10/02/2021):    Time 12    Period Weeks    Status Partially Met   TARGET DATE FOR ALL LONG TERM GOALS: 11/18/2021     PT LONG TERM GOAL #2   Title Demonstrate improved FOTO to equal or greater than 69 by visit #10 to demonstrate improvement in overall condition and self-reported functional ability.    Baseline 60 (08/26/2021); 61 at visit #10 (10/02/2021);    Time 12    Period Weeks    Status On-going      PT LONG TERM GOAL #3   Title Reduce pain with functional activities to equal or less than 1/10 to allow patient to complete usual activities including housework,  stairs, standing  with less difficulty.    Baseline up to 10/10 (08/26/2021); 3-4/10 (10/02/21);    Time 12    Period Weeks    Status Partially Met      PT LONG TERM GOAL #4   Title Patient will ambulate equal or greater than 1000 feet during 6 Minute Walk Test to demonstrate improved community mobilty.    Baseline to be tested visit 2 (08/26/2021);  1160 feet no assistive device. Pain moved out of lower buttocks (08/28/2021); 1276 feet no assistive device (10/02/2021);    Time 12    Period Weeks    Status Achieved      PT LONG TERM GOAL #5   Title Improve B hip strength to equal or greater than 4+/5 with no increase in pain to improve patients ability to complete functional tasks such as hosuework, stairs, community activity  with less difficulty.    Baseline as low as 3+/5 and painful - see objective exam (08/26/2021); improevd to 4/5 or higher but some still painful - see objective (10/02/2021);    Time 12    Period Weeks    Status Partially Met      PT LONG TERM GOAL #6   Title Patient will score equal or greater than 25/30 on Functional Gait Assessment to demonstrate low fall risk.    Baseline To be tested visit 2 as appropriate (08/26/2021); 20/30 medium fall risk (08/28/2021); 22/30 medium fall risk (10/02/2021);    Time 12    Period Weeks    Status Partially Met                   Plan - 10/21/21 1044     Clinical Impression Statement Patient tolerated treatment well overall but continues to complain of concordant pain in her proximal posterior thighs. Continued to work on lumbopelvic control and LE strengthening, purposefully targeting hamstrings in case of hamstring tendinopathy. Did not repeat manual hip flexor stretch due to unacceptable increase in pain following this last session. Patient continues to experience pain that limits her functional activities and has not yet returned to PLOF. She had mild increase in concordant pain by end of session despite efforts to  minimize pain.  Patient would benefit from continued management  of limiting condition by skilled physical therapist to address remaining impairments and functional limitations to work towards stated goals and return to PLOF or maximal functional independence.    Personal Factors and Comorbidities Age;Past/Current Experience    Examination-Activity Limitations Squat;Stairs;Lift;Stand;Carry    Examination-Participation Restrictions Interpersonal Relationship;Meal Prep;Laundry;Cleaning;Shop;Community Activity   slows her down with usual activities such as housework, prolonged standing, balance, stairs, getting up and down from the chair or in and out of the car.   Stability/Clinical Decision Making Stable/Uncomplicated    Rehab Potential Good    PT Frequency 2x / week    PT Duration 12 weeks    PT Treatment/Interventions ADLs/Self Care Home Management;Cryotherapy;Electrical Stimulation;Moist Heat;Gait training;Functional mobility training;Therapeutic activities;Therapeutic exercise;Balance training;Neuromuscular re-education;Manual techniques;Dry needling;Passive range of motion;Patient/family education;Energy conservation    PT Next Visit Plan core/hip/functional strength    PT Home Exercise Plan Medbridge Access Code: [email or web address]    Consulted and Agree with Plan of Care Patient             Patient will benefit from skilled therapeutic intervention in order to improve the following deficits and impairments:  Abnormal gait, Decreased cognition, Pain, Postural dysfunction, Decreased mobility, Decreased activity tolerance, Decreased endurance, Decreased strength, Impaired perceived functional ability, Decreased balance, Difficulty walking, Other (comment) (hostory of fall)  Visit Diagnosis: Chronic bilateral low back pain with bilateral sciatica  History of falling  Muscle weakness (generalized)     Problem List Patient Active Problem List   Diagnosis Date Noted   Subdural hematoma  08/06/2021   History of COVID-19 03/01/2020   Prediabetes 07/06/2018   Age-related osteoporosis without current pathological fracture 12/29/2017   B12 deficiency 12/29/2017   Subcutaneous nodule 06/29/2017   Allergic rhinitis 03/19/2017   Family history of colon cancer 02/11/2017   Hypertension 12/31/2016   Hyperlipidemia 07/03/2016   History of low impact fracture of ankle 07/03/2016   Bilateral finger numbness 04/02/2016    Everlean Alstrom. Graylon Good, PT, DPT 10/21/21, 11:18 AM   [facility] [address] [place], [address] Phone: [phone]   Fax:  [phone]  Name: Jane Willis MRN: [id number] Date of Birth: 06-19-1941

## 2021-10-23 ENCOUNTER — Encounter: Payer: Self-pay | Admitting: Physical Therapy

## 2021-10-23 ENCOUNTER — Ambulatory Visit: Payer: Medicare Other | Admitting: Physical Therapy

## 2021-10-23 DIAGNOSIS — Z9181 History of falling: Secondary | ICD-10-CM

## 2021-10-23 DIAGNOSIS — M6281 Muscle weakness (generalized): Secondary | ICD-10-CM

## 2021-10-23 DIAGNOSIS — M5442 Lumbago with sciatica, left side: Secondary | ICD-10-CM

## 2021-10-23 DIAGNOSIS — G8929 Other chronic pain: Secondary | ICD-10-CM

## 2021-10-23 NOTE — Therapy (Signed)
[facility] [address], [place], [address] Phone: [phone]   Fax:  [phone]  Physical Therapy Treatment  Patient Details  Name: OREE MIRELEZ MRN: [id number] Date of Birth: [birth year] Referring Provider (PT): Allene Dillon, NP   Encounter Date: 10/23/2021   PT End of Session - 10/23/21 1039     Visit Number 14    Number of Visits 24    Date for PT Re-Evaluation 11/18/21    Authorization Type [employer] MEDICARE reporting period from 10/02/2021    Progress Note Due on Visit 10    PT Start Time 1035    PT Stop Time 1113    PT Time Calculation (min) 38 min    Activity Tolerance Patient tolerated treatment well    Behavior During Therapy [facility] for tasks assessed/performed             Past Medical History:  Diagnosis Date   Allergy    Seasonal   Arthritis    Chicken pox    Hyperlipidemia    Hypertension    Urinary incontinence     Past Surgical History:  Procedure Laterality Date   ABDOMINAL HYSTERECTOMY  1986   BREAST EXCISIONAL BIOPSY Left 1970's   benign   BREAST SURGERY  1968   CATARACT EXTRACTION  2014/2015   Both eyes   FRACTURE SURGERY  01/2009   Fractured Left ankle, plate on outside of ankle 2 rods through/across ankle from inside of ankle   OOPHORECTOMY     TONSILLECTOMY AND ADENOIDECTOMY      There were no vitals filed for this visit.   Subjective Assessment - 10/23/21 1037     Subjective Patient reports she is feeling well today. States she has 3/10 pain in the bilateral proximal posterior thighs/buttock region L > R up to 3/10. States she thinks she felt better after last PT session. She states she has decided she cannot go to [place] because it is so much walking and she thinks it will be too difficult due to her back and poterior leg/buttock pain. She is meeting with the surgeon to discuss possible surgical options on 1/31.    Pertinent History Patient is a 81 y.o. female  who presents to outpatient physical therapy with a referral for medical diagnosis lumbar stenosis with neurogenic claudication, lumbar radiculitis, lumbar degenerative disc disease with note from referring clinician for stabilization, balance/falls. This patient's chief complaints consist of pain in B low back, B buttocks, and posterior proximal thighs leading to the following functional deficits: slows her down with usual activities such as housework, prolonged standing, balance, stairs, getting up and down from the chair or in and out of the car..  Relevant past medical history and comorbidities include recent fall with subdural hematoma (08/05/2021), HTN, osteoporosis, L ankle fracture with ORIF (2010).  Patient denies hx of cancer, stroke, seizures, lung problem, major cardiac events, diabetes, unexplained weight loss, changes in bowel or bladder problems, new onset stumbling or dropping things, spinal surgeries.    Limitations House hold activities;Standing;Walking    Diagnostic tests MRI report from 07/09/2021: "Alignment: Dextrocurvature of the lumbar spine. 2 mm retrolisthesis  T12 on L1 and L1 on L2. Grade 1 anterolisthesis L4 on L5 and L5 on  S1."  "IMPRESSION:  1. L4-L5 severe spinal canal stenosis, with severe left and moderate  right neural foraminal narrowing.  2. L1-L2 mild spinal canal stenosis with severe right and mild left  neural foraminal narrowing.  3. L3-L4 and L5-S1 moderate right neural foraminal narrowing.  4. Effacement of the lateral recesses bilaterally at L4-L5 and on  the left at L2-L3 and L3-L4, which may affect the descending left L3  and L4 nerves and the descending bilateral L5 nerves.  5. 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    Patient Stated Goals would like to learn how to alleviate some of her pain, would like to have better pain control with  less medication, get more stability in her trunk, (Feels like she is shrinking).    Currently in Pain? Yes    Pain Score 3                TREATMENT:  +osteoporosis +recent subdural hematoma from contrecoup injury (08/05/21) Precautions from neurology per pt: no squats, stoops, or lifting over 10#   Therapeutic exercise: to centralize symptoms and improve ROM, strength, muscular endurance, and activity tolerance required for successful completion of functional activities.  - NuStep level 5 using lower extremities. Seat setting 8. For improved extremity mobility, muscular endurance, and activity tolerance; and to induce the analgesic effect of aerobic exercise, stimulate improved joint nutrition, and prepare body structures and systems for following interventions. x 5 minutes. Average SPM = 79 - OMEGA machine hamstring curls, B LE, 3x15 at 25# (medium difficulty, 30 second rests, attempted 30# but too heavy for optimal ROM) - OMEGA machine knee extension, B LE, 3x10 at 30#. (rated medium difficulty, 30 second rests). - hooklying sequential leg march and lower, 3x10 alternating sides.  - seated (on theraball) chops with 2kg med ball, 2x10 each side, supervision  Manual therapy: to reduce pain and tissue tension, improve range of motion, neuromodulation, in order to promote improved ability to complete functional activities. PRONE with head in table face cradle and pillows under ankles and abdomen.   - STM to bilateral lumbar paraspinals, glutes, deep hip external rotators, and hamstrings. Increased attention given to concordant painful region in proximal hamstrings R > L and hamstrings and tender region of right mid glute (local pain).    Pt required multimodal cuing for proper technique and to facilitate improved neuromuscular control, strength, range of motion, and functional ability resulting in improved performance and form.   HOME EXERCISE PROGRAM Access Code: [email or web address] URL:  [email or web address] Date: 10/21/2021 Prepared by: Rosita Kea   Exercises Hooklying Sequential Leg March and Lower - 1 x daily - 3 sets - 10 reps Squat with Chair and Counter Support - 1 x daily - 3 sets - 10 reps Seated Single Arm Shoulder Row with Anchored Resistance - 1 x daily - 3 sets - 10 reps   PT Education - 10/23/21 1038     Education Details Exercise purpose/form. Self management techniques    Person(s) Educated Patient    Methods Explanation;Demonstration;Tactile cues;Verbal cues    Comprehension Verbalized understanding;Returned demonstration;Verbal cues required;Tactile cues required;Need further instruction              PT Short Term Goals - 09/16/21 1517       PT SHORT TERM GOAL #1   Title Be independent with initial home exercise program for self-management of symptoms.    Baseline Initial HEP to be provided at visit 2 as appropriate (08/26/2021);    Time 2    Period Weeks    Status Achieved    Target Date  09/09/21               PT Long Term Goals - 10/02/21 1358       PT LONG TERM GOAL #1   Title Be independent with a long-term home exercise program for self-management of symptoms.    Baseline Initial HEP to be provided at visit 2 as appropriate (08/26/2021); Patient reports good participation in appropriate HEP for current level of improvement (10/02/2021):    Time 12    Period Weeks    Status Partially Met   TARGET DATE FOR ALL LONG TERM GOALS: 11/18/2021     PT LONG TERM GOAL #2   Title Demonstrate improved FOTO to equal or greater than 69 by visit #10 to demonstrate improvement in overall condition and self-reported functional ability.    Baseline 60 (08/26/2021); 61 at visit #10 (10/02/2021);    Time 12    Period Weeks    Status On-going      PT LONG TERM GOAL #3   Title Reduce pain with functional activities to equal or less than 1/10 to allow patient to complete usual activities including housework, stairs, standing  with  less difficulty.    Baseline up to 10/10 (08/26/2021); 3-4/10 (10/02/21);    Time 12    Period Weeks    Status Partially Met      PT LONG TERM GOAL #4   Title Patient will ambulate equal or greater than 1000 feet during 6 Minute Walk Test to demonstrate improved community mobilty.    Baseline to be tested visit 2 (08/26/2021);  1160 feet no assistive device. Pain moved out of lower buttocks (08/28/2021); 1276 feet no assistive device (10/02/2021);    Time 12    Period Weeks    Status Achieved      PT LONG TERM GOAL #5   Title Improve B hip strength to equal or greater than 4+/5 with no increase in pain to improve patients ability to complete functional tasks such as hosuework, stairs, community activity  with less difficulty.    Baseline as low as 3+/5 and painful - see objective exam (08/26/2021); improevd to 4/5 or higher but some still painful - see objective (10/02/2021);    Time 12    Period Weeks    Status Partially Met      PT LONG TERM GOAL #6   Title Patient will score equal or greater than 25/30 on Functional Gait Assessment to demonstrate low fall risk.    Baseline To be tested visit 2 as appropriate (08/26/2021); 20/30 medium fall risk (08/28/2021); 22/30 medium fall risk (10/02/2021);    Time 12    Period Weeks    Status Partially Met                   Plan - 10/23/21 1146     Clinical Impression Statement Patient reported increased concordant pain with standing and walking by end of session. She continues to report most concordant pain in the hamstring region and tenderness to palpation in the low back and glutes is localized. Patient appeared more stooped in posture by end of session. Session focused on improving LE and core strength and decreasing pain with manual therapy. Patient would benefit from continued management of limiting condition by skilled physical therapist to address remaining impairments and functional limitations to work towards stated goals and  return to PLOF or maximal functional independence.    Personal Factors and Comorbidities Age;Past/Current Experience    Examination-Activity Limitations Squat;Stairs;Lift;Stand;Carry  Examination-Participation Restrictions Interpersonal Relationship;Meal Prep;Laundry;Cleaning;Shop;Community Activity   slows her down with usual activities such as housework, prolonged standing, balance, stairs, getting up and down from the chair or in and out of the car.   Stability/Clinical Decision Making Stable/Uncomplicated    Rehab Potential Good    PT Frequency 2x / week    PT Duration 12 weeks    PT Treatment/Interventions ADLs/Self Care Home Management;Cryotherapy;Electrical Stimulation;Moist Heat;Gait training;Functional mobility training;Therapeutic activities;Therapeutic exercise;Balance training;Neuromuscular re-education;Manual techniques;Dry needling;Passive range of motion;Patient/family education;Energy conservation    PT Next Visit Plan core/hip/functional strength    PT Home Exercise Plan Medbridge Access Code: [email or web address]    Consulted and Agree with Plan of Care Patient             Patient will benefit from skilled therapeutic intervention in order to improve the following deficits and impairments:  Abnormal gait, Decreased cognition, Pain, Postural dysfunction, Decreased mobility, Decreased activity tolerance, Decreased endurance, Decreased strength, Impaired perceived functional ability, Decreased balance, Difficulty walking, Other (comment) (hostory of fall)  Visit Diagnosis: Chronic bilateral low back pain with bilateral sciatica  History of falling  Muscle weakness (generalized)     Problem List Patient Active Problem List   Diagnosis Date Noted   Subdural hematoma 08/06/2021   History of COVID-19 03/01/2020   Prediabetes 07/06/2018   Age-related osteoporosis without current pathological fracture 12/29/2017   B12 deficiency 12/29/2017   Subcutaneous nodule 06/29/2017    Allergic rhinitis 03/19/2017   Family history of colon cancer 02/11/2017   Hypertension 12/31/2016   Hyperlipidemia 07/03/2016   History of low impact fracture of ankle 07/03/2016   Bilateral finger numbness 04/02/2016    Everlean Alstrom. Graylon Good, PT, DPT 10/23/21, 11:47 AM   [facility] [address], [place], [address] Phone: [phone]   Fax:  [phone]  Name: MELVENIA FAVELA MRN: [id number] Date of Birth: 11-02-1940

## 2021-10-28 ENCOUNTER — Ambulatory Visit: Payer: Medicare Other | Admitting: Physical Therapy

## 2021-10-28 ENCOUNTER — Encounter: Payer: Self-pay | Admitting: Physical Therapy

## 2021-10-28 DIAGNOSIS — Z9181 History of falling: Secondary | ICD-10-CM

## 2021-10-28 DIAGNOSIS — M5442 Lumbago with sciatica, left side: Secondary | ICD-10-CM | POA: Diagnosis not present

## 2021-10-28 DIAGNOSIS — G8929 Other chronic pain: Secondary | ICD-10-CM

## 2021-10-28 DIAGNOSIS — M6281 Muscle weakness (generalized): Secondary | ICD-10-CM

## 2021-10-28 NOTE — Therapy (Signed)
[facility] [address], [place], [address] Phone: [phone]   Fax:  [phone]  Physical Therapy Treatment  Patient Details  Name: Jane Willis MRN: [id number] Date of Birth: 1941/04/27 Referring Provider (PT): Allene Dillon, NP   Encounter Date: 10/28/2021   PT End of Session - 10/28/21 1039     Visit Number 15    Number of Visits 24    Date for PT Re-Evaluation 11/18/21    Authorization Type [employer] MEDICARE reporting period from 10/02/2021    Progress Note Due on Visit 10    PT Start Time 1035    PT Stop Time 1113    PT Time Calculation (min) 38 min    Activity Tolerance Patient tolerated treatment well    Behavior During Therapy [facility] for tasks assessed/performed             Past Medical History:  Diagnosis Date   Allergy    Seasonal   Arthritis    Chicken pox    Hyperlipidemia    Hypertension    Urinary incontinence     Past Surgical History:  Procedure Laterality Date   ABDOMINAL HYSTERECTOMY  1986   BREAST EXCISIONAL BIOPSY Left 1970's   benign   BREAST SURGERY  1968   CATARACT EXTRACTION  2014/2015   Both eyes   FRACTURE SURGERY  01/2009   Fractured Left ankle, plate on outside of ankle 2 rods through/across ankle from inside of ankle   OOPHORECTOMY     TONSILLECTOMY AND ADENOIDECTOMY      There were no vitals filed for this visit.   Subjective Assessment - 10/28/21 1036     Subjective Patient reports she has 2/10 pain in the posterior proximal thighs upon arrival. States she took some tylenol this morning. She states she thinks she felt a bit better after last PT session and she really wants to focus on strengthening the core like Whitney suggested and she is hoping this will help. She states usually her pain is better as the day goes on.    Pertinent History Patient is a 81 y.o. female who presents to outpatient physical therapy with a referral for medical diagnosis  lumbar stenosis with neurogenic claudication, lumbar radiculitis, lumbar degenerative disc disease with note from referring clinician for stabilization, balance/falls. This patient's chief complaints consist of pain in B low back, B buttocks, and posterior proximal thighs leading to the following functional deficits: slows her down with usual activities such as housework, prolonged standing, balance, stairs, getting up and down from the chair or in and out of the car..  Relevant past medical history and comorbidities include recent fall with subdural hematoma (08/05/2021), HTN, osteoporosis, L ankle fracture with ORIF (2010).  Patient denies hx of cancer, stroke, seizures, lung problem, major cardiac events, diabetes, unexplained weight loss, changes in bowel or bladder problems, new onset stumbling or dropping things, spinal surgeries.    Limitations House hold activities;Standing;Walking    Diagnostic tests MRI report from 07/09/2021: "Alignment: Dextrocurvature of the lumbar spine. 2 mm retrolisthesis  T12 on L1 and L1 on L2. Grade 1 anterolisthesis L4 on L5 and L5 on  S1."  "IMPRESSION:  1. L4-L5 severe spinal canal stenosis, with severe left and moderate  right neural foraminal narrowing.  2. L1-L2 mild spinal canal stenosis with severe right and mild left  neural foraminal narrowing.  3. L3-L4 and L5-S1 moderate right neural foraminal narrowing.  4. Effacement of the  lateral recesses bilaterally at L4-L5 and on  the left at L2-L3 and L3-L4, which may affect the descending left L3  and L4 nerves and the descending bilateral L5 nerves.  5. 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    Patient Stated Goals would like to learn how to alleviate some of her pain, would like to have better pain control with less medication, get more stability in her trunk, (Feels like she is shrinking).     Currently in Pain? Yes    Pain Score 2               TREATMENT:  +osteoporosis +recent subdural hematoma from contrecoup injury (08/05/21) Precautions from neurology per pt: no squats, stoops, or lifting over 10#   Therapeutic exercise: to centralize symptoms and improve ROM, strength, muscular endurance, and activity tolerance required for successful completion of functional activities.  - seated lumbar flexion roll out with theraball, 1x3 min.  - seated (on theraball) chops with 3kg med ball, 2x10 each side, supervision  Circuit: - seated (on theraball) marching, 2x10 each side, SBA - seated (on theraball) single arm biceps curl, 2x10 each side with 5# DB, SBA  - sit <> stand from 18 inch chair holding 4# DB at chest with PPT, 3x10 - standing RDL holding 5#DB on the end with both hands and tapping on 4 inch yoga block between legs, 2x10 (pulling at proximal hamstrings that gets better as she is doing it).  - seated lat pull, 2x10 at 15/20#.   Pt required multimodal cuing for proper technique and to facilitate improved neuromuscular control, strength, range of motion, and functional ability resulting in improved performance and form.   HOME EXERCISE PROGRAM Access Code: [email or web address] URL: [email or web address] Date: 10/21/2021 Prepared by: Rosita Kea   Exercises Hooklying Sequential Leg March and Lower - 1 x daily - 3 sets - 10 reps Squat with Chair and Counter Support - 1 x daily - 3 sets - 10 reps Seated Single Arm Shoulder Row with Anchored Resistance - 1 x daily - 3 sets - 10 reps   PT Education - 10/28/21 1039     Education Details Exercise purpose/form. Self management techniques    Person(s) Educated Patient    Methods Explanation;Demonstration;Tactile cues;Verbal cues    Comprehension Verbalized understanding;Returned demonstration;Verbal cues required;Tactile cues required;Need further instruction              PT Short Term Goals - 09/16/21  1517       PT SHORT TERM GOAL #1   Title Be independent with initial home exercise program for self-management of symptoms.    Baseline Initial HEP to be provided at visit 2 as appropriate (08/26/2021);    Time 2    Period Weeks    Status Achieved    Target Date 09/09/21               PT Long Term Goals - 10/02/21 1358       PT LONG TERM GOAL #1   Title Be independent with a long-term home exercise program for self-management of symptoms.    Baseline Initial HEP to be provided at visit 2 as appropriate (08/26/2021); Patient reports good participation in appropriate HEP for current level of improvement (10/02/2021):    Time 12    Period Weeks    Status Partially Met   TARGET  DATE FOR ALL LONG TERM GOALS: 11/18/2021     PT LONG TERM GOAL #2   Title Demonstrate improved FOTO to equal or greater than 69 by visit #10 to demonstrate improvement in overall condition and self-reported functional ability.    Baseline 60 (08/26/2021); 61 at visit #10 (10/02/2021);    Time 12    Period Weeks    Status On-going      PT LONG TERM GOAL #3   Title Reduce pain with functional activities to equal or less than 1/10 to allow patient to complete usual activities including housework, stairs, standing  with less difficulty.    Baseline up to 10/10 (08/26/2021); 3-4/10 (10/02/21);    Time 12    Period Weeks    Status Partially Met      PT LONG TERM GOAL #4   Title Patient will ambulate equal or greater than 1000 feet during 6 Minute Walk Test to demonstrate improved community mobilty.    Baseline to be tested visit 2 (08/26/2021);  1160 feet no assistive device. Pain moved out of lower buttocks (08/28/2021); 1276 feet no assistive device (10/02/2021);    Time 12    Period Weeks    Status Achieved      PT LONG TERM GOAL #5   Title Improve B hip strength to equal or greater than 4+/5 with no increase in pain to improve patients ability to complete functional tasks such as hosuework, stairs,  community activity  with less difficulty.    Baseline as low as 3+/5 and painful - see objective exam (08/26/2021); improevd to 4/5 or higher but some still painful - see objective (10/02/2021);    Time 12    Period Weeks    Status Partially Met      PT LONG TERM GOAL #6   Title Patient will score equal or greater than 25/30 on Functional Gait Assessment to demonstrate low fall risk.    Baseline To be tested visit 2 as appropriate (08/26/2021); 20/30 medium fall risk (08/28/2021); 22/30 medium fall risk (10/02/2021);    Time 12    Period Weeks    Status Partially Met                   Plan - 10/28/21 1104     Clinical Impression Statement Patient tolerated treatment well overall but reported minimal change in pain by end of session. Progressed core strengthening to include more upright activities and did not complete hamstring curls this session. Introduced dead lift to work hamstrings. Patient has not yet returned to prior level of function and continues to have limitations due to her impairments and pain. Patient would benefit from continued management of limiting condition by skilled physical therapist to address remaining impairments and functional limitations to work towards stated goals and return to PLOF or maximal functional independence.    Personal Factors and Comorbidities Age;Past/Current Experience    Examination-Activity Limitations Squat;Stairs;Lift;Stand;Carry    Examination-Participation Restrictions Interpersonal Relationship;Meal Prep;Laundry;Cleaning;Shop;Community Activity   slows her down with usual activities such as housework, prolonged standing, balance, stairs, getting up and down from the chair or in and out of the car.   Stability/Clinical Decision Making Stable/Uncomplicated    Rehab Potential Good    PT Frequency 2x / week    PT Duration 12 weeks    PT Treatment/Interventions ADLs/Self Care Home Management;Cryotherapy;Electrical Stimulation;Moist  Heat;Gait training;Functional mobility training;Therapeutic activities;Therapeutic exercise;Balance training;Neuromuscular re-education;Manual techniques;Dry needling;Passive range of motion;Patient/family education;Energy conservation    PT Next Visit Plan  core/hip/functional strength    PT Home Exercise Plan Medbridge Access Code: [email or web address]    Consulted and Agree with Plan of Care Patient             Patient will benefit from skilled therapeutic intervention in order to improve the following deficits and impairments:  Abnormal gait, Decreased cognition, Pain, Postural dysfunction, Decreased mobility, Decreased activity tolerance, Decreased endurance, Decreased strength, Impaired perceived functional ability, Decreased balance, Difficulty walking, Other (comment) (hostory of fall)  Visit Diagnosis: Chronic bilateral low back pain with bilateral sciatica  History of falling  Muscle weakness (generalized)     Problem List Patient Active Problem List   Diagnosis Date Noted   Subdural hematoma 08/06/2021   History of COVID-19 03/01/2020   Prediabetes 07/06/2018   Age-related osteoporosis without current pathological fracture 12/29/2017   B12 deficiency 12/29/2017   Subcutaneous nodule 06/29/2017   Allergic rhinitis 03/19/2017   Family history of colon cancer 02/11/2017   Hypertension 12/31/2016   Hyperlipidemia 07/03/2016   History of low impact fracture of ankle 07/03/2016   Bilateral finger numbness 04/02/2016    Everlean Alstrom. Graylon Good, PT, DPT 10/28/21, 11:14 AM   [facility] [address] [place], [address] Phone: [phone]   Fax:  [phone]  Name: Jane Willis MRN: [id number] Date of Birth: 04/16/1941

## 2021-10-29 ENCOUNTER — Encounter: Payer: Self-pay | Admitting: Physical Therapy

## 2021-10-29 DIAGNOSIS — Z9181 History of falling: Secondary | ICD-10-CM

## 2021-10-29 DIAGNOSIS — G8929 Other chronic pain: Secondary | ICD-10-CM

## 2021-10-29 DIAGNOSIS — M6281 Muscle weakness (generalized): Secondary | ICD-10-CM

## 2021-10-29 NOTE — Therapy (Signed)
[facility] [address] [place], [address] Phone: [phone]   Fax:  [phone]  Physical Therapy No-Visit Discharge Summary Dates of reporting from 08/26/2021 to 10/29/2021  Patient Details  Name: Jane Willis MRN: [id number] Date of Birth: September 29, 1941 Referring Provider (PT): Allene Dillon, NP   Encounter Date: 10/29/2021    Past Medical History:  Diagnosis Date   Allergy    Seasonal   Arthritis    Chicken pox    Hyperlipidemia    Hypertension    Urinary incontinence     Past Surgical History:  Procedure Laterality Date   ABDOMINAL HYSTERECTOMY  1986   BREAST EXCISIONAL BIOPSY Left 1970's   benign   BREAST SURGERY  1968   CATARACT EXTRACTION  2014/2015   Both eyes   FRACTURE SURGERY  01/2009   Fractured Left ankle, plate on outside of ankle 2 rods through/across ankle from inside of ankle   OOPHORECTOMY     TONSILLECTOMY AND ADENOIDECTOMY      There were no vitals filed for this visit.   Subjective Assessment - 10/29/21 1326     Subjective Patient called to request cancelation of all future PT sessions.    Pertinent History Patient is a 81 y.o. female who presents to outpatient physical therapy with a referral for medical diagnosis lumbar stenosis with neurogenic claudication, lumbar radiculitis, lumbar degenerative disc disease with note from referring clinician for stabilization, balance/falls. This patient's chief complaints consist of pain in B low back, B buttocks, and posterior proximal thighs leading to the following functional deficits: slows her down with usual activities such as housework, prolonged standing, balance, stairs, getting up and down from the chair or in and out of the car..  Relevant past medical history and comorbidities include recent fall with subdural hematoma (08/05/2021), HTN, osteoporosis, L ankle fracture with ORIF (2010).  Patient denies hx of cancer, stroke,  seizures, lung problem, major cardiac events, diabetes, unexplained weight loss, changes in bowel or bladder problems, new onset stumbling or dropping things, spinal surgeries.    Limitations House hold activities;Standing;Walking    Diagnostic tests MRI report from 07/09/2021: "Alignment: Dextrocurvature of the lumbar spine. 2 mm retrolisthesis  T12 on L1 and L1 on L2. Grade 1 anterolisthesis L4 on L5 and L5 on  S1."  "IMPRESSION:  1. L4-L5 severe spinal canal stenosis, with severe left and moderate  right neural foraminal narrowing.  2. L1-L2 mild spinal canal stenosis with severe right and mild left  neural foraminal narrowing.  3. L3-L4 and L5-S1 moderate right neural foraminal narrowing.  4. Effacement of the lateral recesses bilaterally at L4-L5 and on  the left at L2-L3 and L3-L4, which may affect the descending left L3  and L4 nerves and the descending bilateral L5 nerves.  5. 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    Patient Stated Goals would like to learn how to alleviate some of her pain, would like to have better pain control with less medication, get more stability in her trunk, (Feels like she is shrinking).            OBJECTIVE Patient is not present for examination at this time. Please see previous documentation for latest objective data.     PT Short Term Goals - 09/16/21 1517       PT  SHORT TERM GOAL #1   Title Be independent with initial home exercise program for self-management of symptoms.    Baseline Initial HEP to be provided at visit 2 as appropriate (08/26/2021);    Time 2    Period Weeks    Status Achieved    Target Date 09/09/21               PT Long Term Goals - 10/29/21 1329       PT LONG TERM GOAL #1   Title Be independent with a long-term home exercise program for self-management of symptoms.    Baseline Initial HEP to  be provided at visit 2 as appropriate (08/26/2021); Patient reports good participation in appropriate HEP for current level of improvement (10/02/2021):    Time 12    Period Weeks    Status Achieved   TARGET DATE FOR ALL LONG TERM GOALS: 11/18/2021     PT LONG TERM GOAL #2   Title Demonstrate improved FOTO to equal or greater than 69 by visit #10 to demonstrate improvement in overall condition and self-reported functional ability.    Baseline 60 (08/26/2021); 61 at visit #10 (10/02/2021);    Time 12    Period Weeks    Status Not Met      PT LONG TERM GOAL #3   Title Reduce pain with functional activities to equal or less than 1/10 to allow patient to complete usual activities including housework, stairs, standing  with less difficulty.    Baseline up to 10/10 (08/26/2021); 3-4/10 (10/02/21);    Time 12    Period Weeks    Status Partially Met      PT LONG TERM GOAL #4   Title Patient will ambulate equal or greater than 1000 feet during 6 Minute Walk Test to demonstrate improved community mobilty.    Baseline to be tested visit 2 (08/26/2021);  1160 feet no assistive device. Pain moved out of lower buttocks (08/28/2021); 1276 feet no assistive device (10/02/2021);    Time 12    Period Weeks    Status Achieved      PT LONG TERM GOAL #5   Title Improve B hip strength to equal or greater than 4+/5 with no increase in pain to improve patients ability to complete functional tasks such as hosuework, stairs, community activity  with less difficulty.    Baseline as low as 3+/5 and painful - see objective exam (08/26/2021); improevd to 4/5 or higher but some still painful - see objective (10/02/2021);    Time 12    Period Weeks    Status Partially Met      PT LONG TERM GOAL #6   Title Patient will score equal or greater than 25/30 on Functional Gait Assessment to demonstrate low fall risk.    Baseline To be tested visit 2 as appropriate (08/26/2021); 20/30 medium fall risk (08/28/2021); 22/30  medium fall risk (10/02/2021);    Time 12    Period Weeks    Status Partially Met                   Plan - 10/29/21 1327     Clinical Impression Statement Patient attended 15 physical therapy sessions this episode of care. Initially she appeared to be making progress but then her improvements plateaued, prompting changes in rehab approach without success in the last few visits. She called and requested future visits be canceled and is therefore being discharged at this time. Attempted to call patient to  clarify why she canceled appointments but did not reach her. Patient was planning to meet with a surgeon.    Personal Factors and Comorbidities Age;Past/Current Experience    Examination-Activity Limitations Squat;Stairs;Lift;Stand;Carry    Examination-Participation Restrictions Interpersonal Relationship;Meal Prep;Laundry;Cleaning;Shop;Community Activity   slows her down with usual activities such as housework, prolonged standing, balance, stairs, getting up and down from the chair or in and out of the car.   Stability/Clinical Decision Making Stable/Uncomplicated    Rehab Potential Good    PT Frequency 2x / week    PT Duration 12 weeks    PT Treatment/Interventions ADLs/Self Care Home Management;Cryotherapy;Electrical Stimulation;Moist Heat;Gait training;Functional mobility training;Therapeutic activities;Therapeutic exercise;Balance training;Neuromuscular re-education;Manual techniques;Dry needling;Passive range of motion;Patient/family education;Energy conservation    PT Next Visit Plan patient is now discharged    PT [place] Access Code: [email or web address]    Consulted and Agree with Plan of Care Patient             Patient will benefit from skilled therapeutic intervention in order to improve the following deficits and impairments:  Abnormal gait, Decreased cognition, Pain, Postural dysfunction, Decreased mobility, Decreased activity tolerance, Decreased  endurance, Decreased strength, Impaired perceived functional ability, Decreased balance, Difficulty walking, Other (comment) (hostory of fall)  Visit Diagnosis: Chronic bilateral low back pain with bilateral sciatica  History of falling  Muscle weakness (generalized)     Problem List Patient Active Problem List   Diagnosis Date Noted   Subdural hematoma 08/06/2021   History of COVID-19 03/01/2020   Prediabetes 07/06/2018   Age-related osteoporosis without current pathological fracture 12/29/2017   B12 deficiency 12/29/2017   Subcutaneous nodule 06/29/2017   Allergic rhinitis 03/19/2017   Family history of colon cancer 02/11/2017   Hypertension 12/31/2016   Hyperlipidemia 07/03/2016   History of low impact fracture of ankle 07/03/2016   Bilateral finger numbness 04/02/2016    Everlean Alstrom. Graylon Good, PT, DPT 10/29/21, 1:30 PM   [facility] [address] [place], [address] Phone: [phone]   Fax:  [phone]  Name: Jane Willis MRN: [id number] Date of Birth: July 08, 1941

## 2021-10-30 ENCOUNTER — Ambulatory Visit: Payer: Medicare Other | Admitting: Physical Therapy

## 2021-11-04 ENCOUNTER — Encounter: Payer: Medicare Other | Admitting: Physical Therapy

## 2021-11-06 ENCOUNTER — Encounter: Payer: Medicare Other | Admitting: Physical Therapy

## 2021-11-11 ENCOUNTER — Encounter: Payer: Medicare Other | Admitting: Physical Therapy

## 2021-11-13 ENCOUNTER — Encounter: Payer: Medicare Other | Admitting: Physical Therapy

## 2021-11-18 ENCOUNTER — Encounter: Payer: Medicare Other | Admitting: Physical Therapy

## 2021-11-21 ENCOUNTER — Other Ambulatory Visit: Payer: Self-pay | Admitting: Neurosurgery

## 2021-11-21 DIAGNOSIS — Z01818 Encounter for other preprocedural examination: Secondary | ICD-10-CM

## 2021-11-25 ENCOUNTER — Encounter: Payer: Medicare Other | Admitting: Physical Therapy

## 2021-11-27 ENCOUNTER — Encounter: Payer: Medicare Other | Admitting: Physical Therapy

## 2021-11-28 ENCOUNTER — Ambulatory Visit (INDEPENDENT_AMBULATORY_CARE_PROVIDER_SITE_OTHER): Payer: Medicare Other | Admitting: Family Medicine

## 2021-11-28 ENCOUNTER — Other Ambulatory Visit: Payer: Self-pay

## 2021-11-28 ENCOUNTER — Encounter: Payer: Self-pay | Admitting: Family Medicine

## 2021-11-28 DIAGNOSIS — M810 Age-related osteoporosis without current pathological fracture: Secondary | ICD-10-CM | POA: Diagnosis not present

## 2021-11-28 DIAGNOSIS — M5442 Lumbago with sciatica, left side: Secondary | ICD-10-CM

## 2021-11-28 DIAGNOSIS — M5441 Lumbago with sciatica, right side: Secondary | ICD-10-CM

## 2021-11-28 DIAGNOSIS — I10 Essential (primary) hypertension: Secondary | ICD-10-CM | POA: Diagnosis not present

## 2021-11-28 DIAGNOSIS — G8929 Other chronic pain: Secondary | ICD-10-CM

## 2021-11-28 DIAGNOSIS — S062XAA Diffuse traumatic brain injury with loss of consciousness status unknown, initial encounter: Secondary | ICD-10-CM | POA: Diagnosis not present

## 2021-11-28 NOTE — Patient Instructions (Signed)
Nice to see you. Good luck with your surgery. Please keep your appointment with endocrinology.

## 2021-11-28 NOTE — Assessment & Plan Note (Signed)
Patient with chronic low back pain.  She has completed conservative management.  She is scheduled for surgery later this month.  I encouraged her to keep her scheduled surgery.

## 2021-11-28 NOTE — Progress Notes (Signed)
[name], MD Phone: [phone]  Jane Willis is a 81 y.o. female who presents today for f/u.  HYPERTENSION Disease Monitoring Home BP Monitoring not checking Chest pain- no    Dyspnea- no Medications Compliance-  taking amlodipine  Edema- no BMET    Component Value Date/Time   NA 134 (L) 08/09/2021 0433   K 3.9 08/09/2021 0433   CL 104 08/09/2021 0433   CO2 25 08/09/2021 0433   GLUCOSE 101 (H) 08/09/2021 0433   BUN 12 08/09/2021 0433   CREATININE 0.74 08/09/2021 0433   CALCIUM 8.5 (L) 08/09/2021 0433   GFRNONAA >60 08/09/2021 0433   Chronic low back pain: This is an ongoing issue.  It hurts to sit or stand.  Walking provides relief.  She had 2 injections and physical therapy with little benefit.  She is scheduled for surgery later this month.  No numbness or weakness in her legs.  She does have pain radiating down her legs.  Temporal lobe contusion: She had a fall down the stairs in October 2022.  She was on Keppra for seizure prophylaxis for a week.  Neurosurgery evaluated her and advised conservative management.  They did follow-up with her and released her with regards to this.  She notes rare headaches that are minimal and may be related to the weather.  She notes no severe headaches.  She feels good overall related to this issue.  Osteoporosis: She is doing Reclast with endocrinology.  She wonders if she needs to see endocrinology for this given that she will be 1 month out from a surgery for her back.  Social History   Tobacco Use  Smoking Status Never  Smokeless Tobacco Never    Current Outpatient Medications on File Prior to Visit  Medication Sig Dispense Refill   acetaminophen (TYLENOL) 500 MG tablet Take 1,000 mg by mouth daily as needed for moderate pain.     amLODipine (NORVASC) 10 MG tablet TAKE 1 TABLET(10 MG) BY MOUTH DAILY 90 tablet 1   Ascorbic Acid (VITAMIN C) 1000 MG tablet Take 1,000 mg by mouth daily.     aspirin 81 MG tablet Take 1 tablet  (81 mg total) by mouth daily. Do not take this medication for 14 days (Patient taking differently: Take 81 mg by mouth every evening.) 30 tablet    Cholecalciferol (VITAMIN D-3) 1000 UNITS CAPS Take 1,000 capsules by mouth 2 (two) times daily.     methylcellulose oral powder Take by mouth daily.     Multiple Minerals-Vitamins (CITRACAL MAXIMUM PLUS) TABS Take 1 tablet by mouth 2 (two) times daily.     Omega-3 Fatty Acids (FISH OIL) 1200 MG CAPS Take 1,200 mg by mouth daily.     Polyethyl Glycol-Propyl Glycol (SYSTANE OP) Place 1 drop into both eyes daily as needed (dry eyes).     vitamin B-12 (CYANOCOBALAMIN) 1000 MCG tablet Take 1,000 mcg by mouth 2 (two) times daily.     No current facility-administered medications on file prior to visit.     ROS see history of present illness  Objective  Physical Exam Vitals:   11/28/21 0823  BP: 130/70  Pulse: 78  Temp: 98.5 F (36.9 C)  SpO2: 94%    BP Readings from Last 3 Encounters:  11/28/21 130/70  08/09/21 131/72  03/04/21 (!) 145/82   Wt Readings from Last 3 Encounters:  11/28/21 134 lb 6.4 oz (61 kg)  08/06/21 138 lb (62.6 kg)  03/04/21 135 lb 3.2 oz (61.3 kg)  Physical Exam Constitutional:      General: She is not in acute distress.    Appearance: She is not diaphoretic.  Cardiovascular:     Rate and Rhythm: Normal rate and regular rhythm.     Heart sounds: Normal heart sounds.  Pulmonary:     Effort: Pulmonary effort is normal.     Breath sounds: Normal breath sounds.  Skin:    General: Skin is warm and dry.  Neurological:     Mental Status: She is alert.     Comments: EOMI, PERRL, opens and closes eyes adequately, V1 through V3 intact light touch sensation, hearing intact to finger rub, shoulder shrug intact, 5/5 strength in bilateral biceps, triceps, grip, quads, hamstrings, plantar and dorsiflexion, sensation to light touch intact in bilateral UE and LE     Assessment/Plan: Please see individual problem  list.  Problem List Items Addressed This Visit     Age-related osteoporosis without current pathological fracture    Chronic issue.  I advised that she should keep follow-up with endocrinology given that osteoporosis could make it difficult for appropriate bone response to her surgery and she needs to continue on treatment for osteoporosis.      Chronic low back pain    Patient with chronic low back pain.  She has completed conservative management.  She is scheduled for surgery later this month.  I encouraged her to keep her scheduled surgery.      Contusion of temporal lobe    Patient has completed follow-up with neurosurgery.  She feels well overall.      Hypertension    Well-controlled.  She will continue amlodipine 10 mg once daily.        Health Maintenance: She will confirm the dates of her Shingrix vaccine and pneumonia vaccines.  Return in about 6 months (around 05/28/2022) for htn.  This visit occurred during the SARS-CoV-2 public health emergency.  Safety protocols were in place, including screening questions prior to the visit, additional usage of staff PPE, and extensive cleaning of exam room while observing appropriate contact time as indicated for disinfecting solutions.    [name], MD [place]

## 2021-11-28 NOTE — Assessment & Plan Note (Signed)
Well-controlled.  She will continue amlodipine 10 mg once daily. 

## 2021-11-28 NOTE — Assessment & Plan Note (Signed)
Chronic issue.  I advised that she should keep follow-up with endocrinology given that osteoporosis could make it difficult for appropriate bone response to her surgery and she needs to continue on treatment for osteoporosis.

## 2021-11-28 NOTE — Assessment & Plan Note (Signed)
Patient has completed follow-up with neurosurgery.  She feels well overall.

## 2021-12-02 ENCOUNTER — Encounter
Admission: RE | Admit: 2021-12-02 | Discharge: 2021-12-02 | Disposition: A | Discharge status: Home / Self Care | Payer: Medicare Other | Source: Ambulatory Visit | Attending: Neurosurgery | Admitting: Neurosurgery

## 2021-12-02 ENCOUNTER — Encounter: Payer: Medicare Other | Admitting: Physical Therapy

## 2021-12-02 ENCOUNTER — Other Ambulatory Visit: Payer: Self-pay

## 2021-12-02 DIAGNOSIS — Z01812 Encounter for preprocedural laboratory examination: Secondary | ICD-10-CM | POA: Diagnosis present

## 2021-12-02 HISTORY — DX: Nausea with vomiting, unspecified: R11.2

## 2021-12-02 HISTORY — DX: Other specified postprocedural states: Z98.890

## 2021-12-02 LAB — URINALYSIS, ROUTINE W REFLEX MICROSCOPIC
Bilirubin Urine: NEGATIVE
Glucose, UA: NEGATIVE mg/dL
Hgb urine dipstick: NEGATIVE
Ketones, ur: NEGATIVE mg/dL
Nitrite: NEGATIVE
Protein, ur: NEGATIVE mg/dL
Specific Gravity, Urine: 1.005 (ref 1.005–1.030)
Squamous Epithelial / HPF: NONE SEEN (ref 0–5)
pH: 7 (ref 5.0–8.0)

## 2021-12-02 LAB — TYPE AND SCREEN
ABO/RH(D): A POS
Antibody Screen: NEGATIVE

## 2021-12-02 LAB — PROTIME-INR
INR: 1 (ref 0.8–1.2)
Prothrombin Time: 12.6 seconds (ref 11.4–15.2)

## 2021-12-02 LAB — BASIC METABOLIC PANEL
Anion gap: 9 (ref 5–15)
BUN: 18 mg/dL (ref 8–23)
CO2: 25 mmol/L (ref 22–32)
Calcium: 9.2 mg/dL (ref 8.9–10.3)
Chloride: 102 mmol/L (ref 98–111)
Creatinine, Ser: 0.7 mg/dL (ref 0.44–1.00)
GFR, Estimated: >60 mL/min (ref >60)
Glucose, Bld: 96 mg/dL (ref 70–99)
Potassium: 3.5 mmol/L (ref 3.5–5.1)
Sodium: 136 mmol/L (ref 135–145)

## 2021-12-02 LAB — CBC
HCT: 41.7 % (ref 36.0–46.0)
Hemoglobin: 13.2 g/dL (ref 12.0–15.0)
MCH: 29.3 pg (ref 26.0–34.0)
MCHC: 31.7 g/dL (ref 30.0–36.0)
MCV: 92.5 fL (ref 80.0–100.0)
Platelets: 328 10*3/uL (ref 150–400)
RBC: 4.51 MIL/uL (ref 3.87–5.11)
RDW: 13.6 % (ref 11.5–15.5)
WBC: 6.3 10*3/uL (ref 4.0–10.5)
nRBC: 0 % (ref 0.0–0.2)

## 2021-12-02 LAB — SURGICAL PCR SCREEN
MRSA, PCR: NEGATIVE
Staphylococcus aureus: NEGATIVE

## 2021-12-02 LAB — APTT: aPTT: 30 seconds (ref 24–36)

## 2021-12-02 NOTE — Patient Instructions (Addendum)
Your procedure is scheduled on: Wednesday December 10, 2021. Report to Day Surgery inside [facility] 2nd floor, stop at admissions desk before getting on elevator.  To find out your arrival time please call [phone] between 1PM - 3PM on Tuesday December 09, 2021.  Remember: Instructions that are not followed completely may result in serious medical risk,  up to and including death, or upon the discretion of your surgeon and anesthesiologist your  surgery may need to be rescheduled.     _X__ 1. Do not eat food after midnight the night before your procedure.                 No chewing gum or hard candies. You may drink clear liquids up to 2 hours                 before you are scheduled to arrive for your surgery- DO not drink clear                 liquids within 2 hours of the start of your surgery.                 Clear Liquids include:  water, apple juice without pulp, clear Gatorade, G2 or                  Gatorade Zero (avoid Red/Purple/Blue), Black Coffee or Tea (Do not add                 anything to coffee or tea).  __X__2.  On the morning of surgery brush your teeth with toothpaste and water, you                may rinse your mouth with mouthwash if you wish.  Do not swallow any toothpaste or mouthwash.     _X__ 3.  No Alcohol for 24 hours before or after surgery.   _X__ 4.  Do Not Smoke or use e-cigarettes For 24 Hours Prior to Your Surgery.                 Do not use any chewable tobacco products for at least 6 hours prior to                 Surgery.  _X__  5.  Do not use any recreational drugs (marijuana, cocaine, heroin, ecstasy, MDMA or other)                For at least one week prior to your surgery.  Combination of these drugs with anesthesia                May have life threatening results.  ____  6.  Bring all medications with you on the day of surgery if instructed.   __X__7.  Notify your doctor if there is any change in your medical  condition      (cold, fever, infections).     Do not wear jewelry, make-up, hairpins, clips or nail polish. Do not wear lotions, powders, or perfumes. You may wear deodorant. Do not shave 48 hours prior to surgery. Men may shave face and neck. Do not bring valuables to the hospital.    [facility] is not responsible for any belongings or valuables.  Contacts, dentures or bridgework may not be worn into surgery. Leave your suitcase in the car. After surgery it may be brought to your room. For patients admitted to the hospital, discharge time is determined by  your treatment team.   Patients discharged the day of surgery will not be allowed to drive home.   Make arrangements for someone to be with you for the first 24 hours of your Same Day Discharge.    Please read over the following fact sheets that you were given:   Spine Patient Guide     __X__ Take these medicines the morning of surgery with A SIP OF WATER:    1. amLODipine (NORVASC) 10 MG   2.   3.   4.  5.  6.  ____ Fleet Enema (as directed)   __X__ Use CHG Soap (or wipes) as directed  ____ Use Benzoyl Peroxide Gel as instructed  ____ Use inhalers on the day of surgery  ____ Stop metformin 2 days prior to surgery    ____ Take 1/2 of usual insulin dose the night before surgery. No insulin the morning          of surgery.   __X__ Stop aspirin 81 mg 7 days before your procedure as instructed by your doctor (last day should be 12/02/20)   __X__ One Week prior to surgery- Stop Anti-inflammatories such as Ibuprofen, Aleve, Advil, Motrin, meloxicam (MOBIC), diclofenac, etodolac, ketorolac, Toradol, Daypro, piroxicam, Goody's or BC powders. OK TO USE TYLENOL IF NEEDED   __X__ One week prior to your surgery - Stop ALL supplements until after surgery.    ____ Bring C-Pap to the hospital.    If you have any questions regarding your pre-procedure instructions,  Please call Pre-admit Testing at [phone]

## 2021-12-04 ENCOUNTER — Encounter: Payer: Medicare Other | Admitting: Physical Therapy

## 2021-12-05 DIAGNOSIS — Z01812 Encounter for preprocedural laboratory examination: Secondary | ICD-10-CM

## 2021-12-05 DIAGNOSIS — B9620 Unspecified Escherichia coli [E. coli] as the cause of diseases classified elsewhere: Secondary | ICD-10-CM

## 2021-12-05 LAB — URINE CULTURE: Culture: >=100000 — AB

## 2021-12-05 MED ORDER — SULFAMETHOXAZOLE-TRIMETHOPRIM 800-160 MG PO TABS: 1.0000 | ORAL_TABLET | Freq: Two times a day (BID) | ORAL | 0 refills | Status: AC

## 2021-12-05 NOTE — Progress Notes (Signed)
°  [facility] Perioperative Services: Pre-Admission/Anesthesia Testing  Abnormal Lab Notification and Treatment Plan of Care   Date: 12/05/21  Name: Jane Willis MRN:   [id number]  Re: Abnormal labs noted during PAT appointment   Notified:  Provider Name Provider Role Notification Mode  Meade Maw, MD Neurosurgery Routed and/or faxed via [facility]   Abnormal Lab Value(s):   Lab Results  Component Value Date   COLORURINE STRAW (A) 12/02/2021   APPEARANCEUR CLEAR (A) 12/02/2021   LABSPEC 1.005 12/02/2021   PHURINE 7.0 12/02/2021   GLUCOSEU NEGATIVE 12/02/2021   HGBUR NEGATIVE 12/02/2021   BILIRUBINUR NEGATIVE 12/02/2021   KETONESUR NEGATIVE 12/02/2021   PROTEINUR NEGATIVE 12/02/2021   NITRITE NEGATIVE 12/02/2021   LEUKOCYTESUR MODERATE (A) 12/02/2021   EPIU NONE SEEN 12/02/2021   WBCU 11-20 12/02/2021   RBCU 0-5 12/02/2021   BACTERIA MANY (A) 12/02/2021   CULT >=100,000 COLONIES/mL ESCHERICHIA COLI (A) 12/02/2021    Clinical Information and Notes:  Patient is scheduled for an L4-5 LATERAL INTERBODY FUSION WITH PERCUTANEOUS FIXATION on 12/10/2020.  UA performed in PAT consistent with/concerning for infection.  No leukocytosis noted on CBC; WBC 6300 Renal function: Estimated Creatinine Clearance: 44.8 mL/min (by C-G formula based on SCr of 0.7 mg/dL). Urine C&S added to assess for pathogenically significant growth.  Impression and Plan:  Arbutus Leas with a UA that was (+) for infection; reflex culture sent that grew out E.Coli. Contacted patient to discuss. Patient reporting that she is experiencing urgency, however when she attempts to void, she either cannot or only voids a small amount. Patient denies abdominal pain, nausea, vomiting, and fevers. Patient with surgery scheduled soon. In efforts to avoid delaying patient's procedure, or have her experience any potentially significant perioperative complications related to the  aforementioned, I would like to proceed with empiric treatment for urinary tract infection.  Allergies reviewed. Culture report also reviewed to ensure culture appropriate coverage is being provided. Will treat with a 3 day course of SMZ-TMP DS. Patient encouraged to complete the entire course of antibiotics even if she begins to feel better.  Meds ordered this encounter  Medications   sulfamethoxazole-trimethoprim (BACTRIM DS) 800-160 MG tablet    Sig: Take 1 tablet by mouth 2 (two) times daily for 3 days. Increase WATER intake while taking this medication.    Dispense:  6 tablet    Refill:  0   Patient encouraged to increase her fluid intake as much as possible. Discussed that water is always best to flush the urinary tract. She was advised to avoid caffeine containing fluids until her infections clears, as caffeine can cause her to experience painful bladder spasms.   May use Tylenol as needed for pain/fever should she experience these symptoms.   Patient instructed to call surgeon's office or PAT with any questions or concerns related to the above outlined course of treatment. Additionally, she was instructed to call if she feels like she is getting worse overall while on treatment. Results and treatment plan of care forwarded to primary attending surgeon to make them aware.   Honor Loh, MSN, APRN, FNP-C, CEN [facility]  Peri-operative Services Nurse Practitioner Phone: [phone] Fax: [phone] 12/05/21 5:06 PM

## 2021-12-08 ENCOUNTER — Other Ambulatory Visit: Payer: Self-pay

## 2021-12-08 ENCOUNTER — Other Ambulatory Visit
Admission: RE | Admit: 2021-12-08 | Discharge: 2021-12-08 | Disposition: A | Discharge status: Home / Self Care | Payer: Medicare Other | Source: Ambulatory Visit | Attending: Neurosurgery | Admitting: Neurosurgery

## 2021-12-08 DIAGNOSIS — Z20822 Contact with and (suspected) exposure to covid-19: Secondary | ICD-10-CM

## 2021-12-08 DIAGNOSIS — Z01812 Encounter for preprocedural laboratory examination: Secondary | ICD-10-CM | POA: Insufficient documentation

## 2021-12-09 ENCOUNTER — Encounter: Payer: Medicare Other | Admitting: Physical Therapy

## 2021-12-09 LAB — SARS CORONAVIRUS 2 (TAT 6-24 HRS): SARS Coronavirus 2: NEGATIVE

## 2021-12-10 ENCOUNTER — Other Ambulatory Visit: Payer: Self-pay

## 2021-12-10 ENCOUNTER — Inpatient Hospital Stay: Payer: Medicare Other | Admitting: Urgent Care

## 2021-12-10 ENCOUNTER — Inpatient Hospital Stay: Payer: Medicare Other

## 2021-12-10 ENCOUNTER — Encounter: Payer: Self-pay | Admitting: Neurosurgery

## 2021-12-10 ENCOUNTER — Encounter
Admission: RE | Disposition: A | Discharge status: Home / Self Care | Payer: Self-pay | Source: Home / Self Care | Attending: Neurosurgery

## 2021-12-10 ENCOUNTER — Inpatient Hospital Stay
Admission: RE | Admit: 2021-12-10 | Discharge: 2021-12-12 | DRG: 455 | Disposition: A | Discharge status: Home / Self Care | Payer: Medicare Other | Attending: Neurosurgery | Admitting: Neurosurgery

## 2021-12-10 DIAGNOSIS — M810 Age-related osteoporosis without current pathological fracture: Secondary | ICD-10-CM | POA: Diagnosis present

## 2021-12-10 DIAGNOSIS — Z79899 Other long term (current) drug therapy: Secondary | ICD-10-CM

## 2021-12-10 DIAGNOSIS — Z419 Encounter for procedure for purposes other than remedying health state, unspecified: Secondary | ICD-10-CM

## 2021-12-10 DIAGNOSIS — I10 Essential (primary) hypertension: Secondary | ICD-10-CM | POA: Diagnosis present

## 2021-12-10 DIAGNOSIS — Z7982 Long term (current) use of aspirin: Secondary | ICD-10-CM | POA: Diagnosis not present

## 2021-12-10 DIAGNOSIS — G8929 Other chronic pain: Secondary | ICD-10-CM | POA: Diagnosis present

## 2021-12-10 DIAGNOSIS — M4316 Spondylolisthesis, lumbar region: Secondary | ICD-10-CM | POA: Diagnosis present

## 2021-12-10 DIAGNOSIS — M5442 Lumbago with sciatica, left side: Secondary | ICD-10-CM | POA: Diagnosis present

## 2021-12-10 DIAGNOSIS — Z20822 Contact with and (suspected) exposure to covid-19: Secondary | ICD-10-CM | POA: Diagnosis present

## 2021-12-10 DIAGNOSIS — M48062 Spinal stenosis, lumbar region with neurogenic claudication: Secondary | ICD-10-CM | POA: Diagnosis present

## 2021-12-10 DIAGNOSIS — Z981 Arthrodesis status: Secondary | ICD-10-CM

## 2021-12-10 HISTORY — PX: ANTERIOR LATERAL LUMBAR FUSION WITH PERCUTANEOUS SCREW 1 LEVEL: SHX5553

## 2021-12-10 HISTORY — PX: APPLICATION OF INTRAOPERATIVE CT SCAN: SHX6668

## 2021-12-10 LAB — CBC
HCT: 36.7 % (ref 36.0–46.0)
Hemoglobin: 11.9 g/dL — ABNORMAL LOW (ref 12.0–15.0)
MCH: 29.6 pg (ref 26.0–34.0)
MCHC: 32.4 g/dL (ref 30.0–36.0)
MCV: 91.3 fL (ref 80.0–100.0)
Platelets: 279 10*3/uL (ref 150–400)
RBC: 4.02 MIL/uL (ref 3.87–5.11)
RDW: 13.6 % (ref 11.5–15.5)
WBC: 10.2 10*3/uL (ref 4.0–10.5)
nRBC: 0 % (ref 0.0–0.2)

## 2021-12-10 LAB — ABO/RH: ABO/RH(D): A POS

## 2021-12-10 LAB — CREATININE, SERUM
Creatinine, Ser: 0.98 mg/dL (ref 0.44–1.00)
GFR, Estimated: 58 mL/min — ABNORMAL LOW (ref >60)

## 2021-12-10 IMAGING — CT DG LUMBAR SPINE 2-3V
3 series · 16 of 33 positions shown, 19 images · non-contrast
Comparison: MRI lumbar spine [DATE]

CLINICAL DATA: L4-5 XLIF

EXAM:
LUMBAR SPINE - 2-3 VIEW

[Series 7: — · sagittal · 0.36mm/px · 5 of 148 slices shown, 6 images (1 of 3)]
[im 50/148  bone]
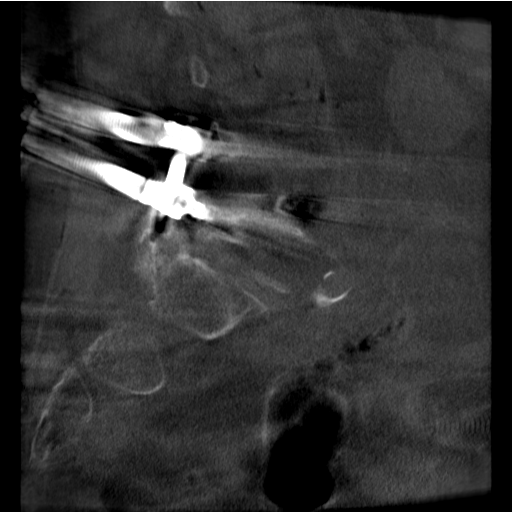
[im 62/148  bone]
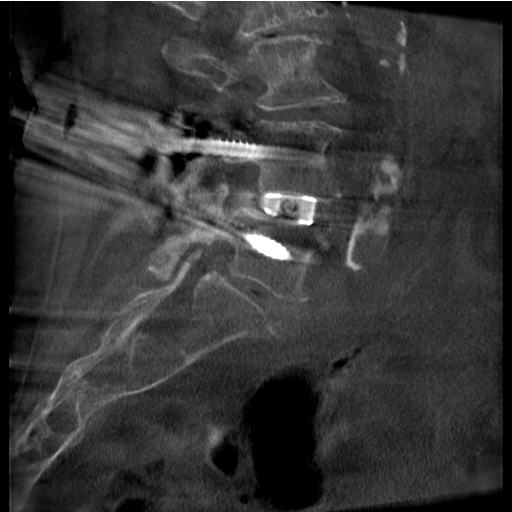
[im 74/148  soft-tissue]
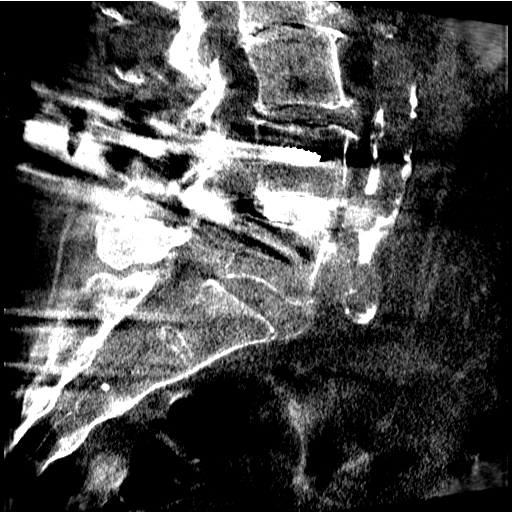
[im 74/148  bone]
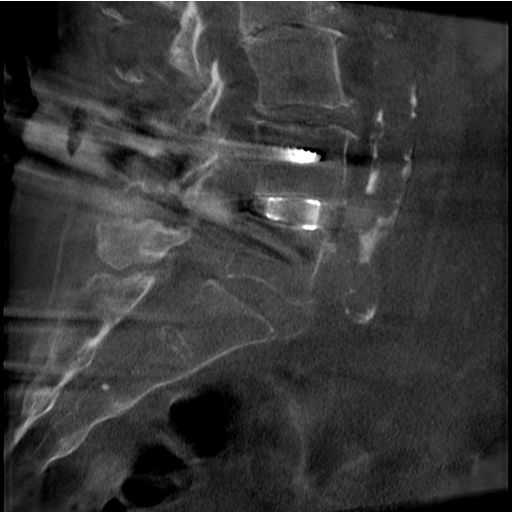
[im 86/148  bone]
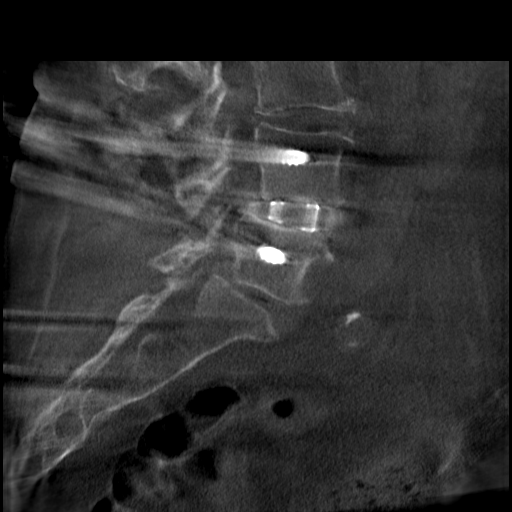
[im 99/148  bone]
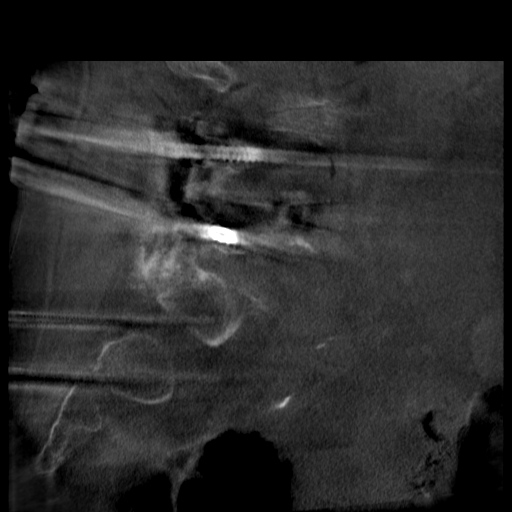

[Series 7: — · axial · 0.36mm/px · z∈[-77,+77]mm · 8 of 170 slices shown, 10 images (2 of 3)]
[im 14/170  soft-tissue]
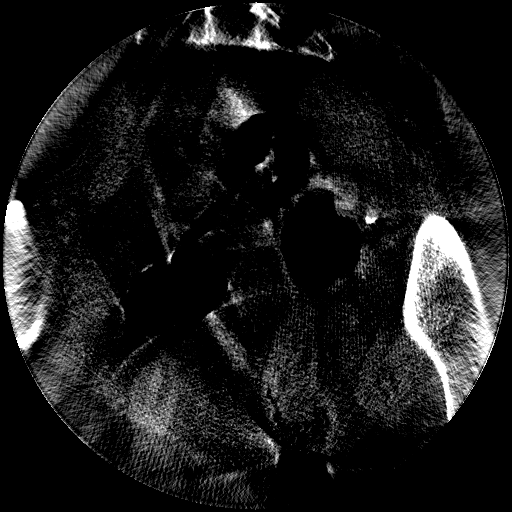
[im 14/170  bone]
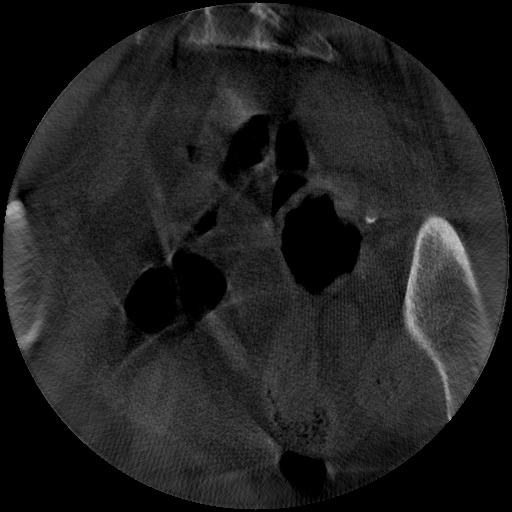
[im 40/170  bone]
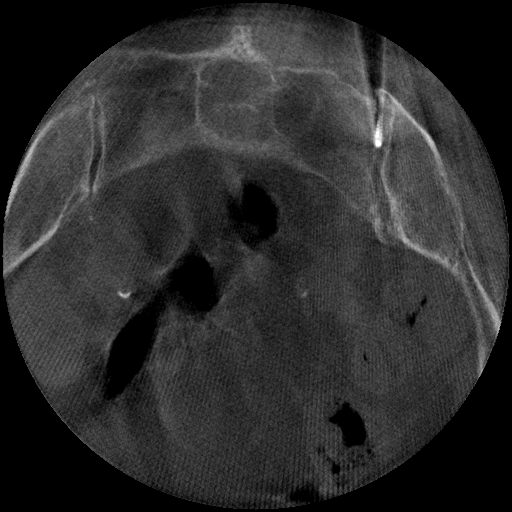
[im 53/170  bone]
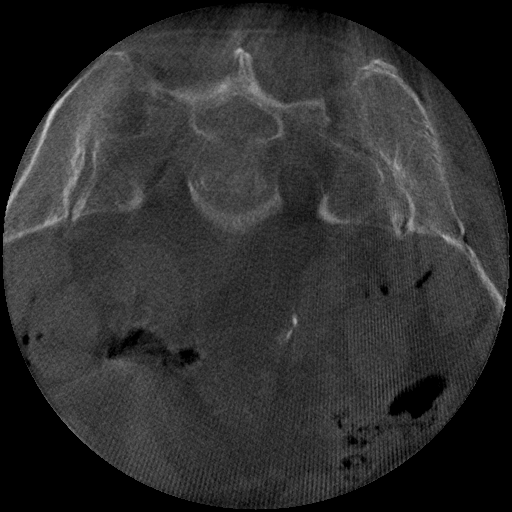
[im 79/170  bone]
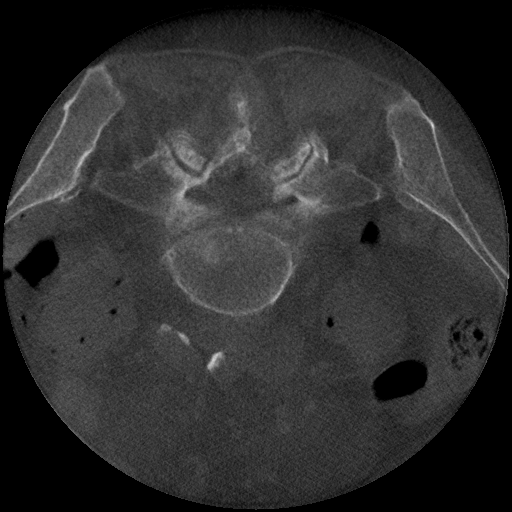
[im 92/170  soft-tissue]
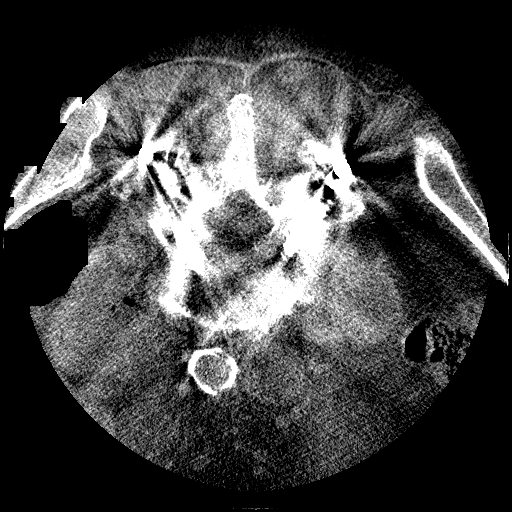
[im 92/170  bone]
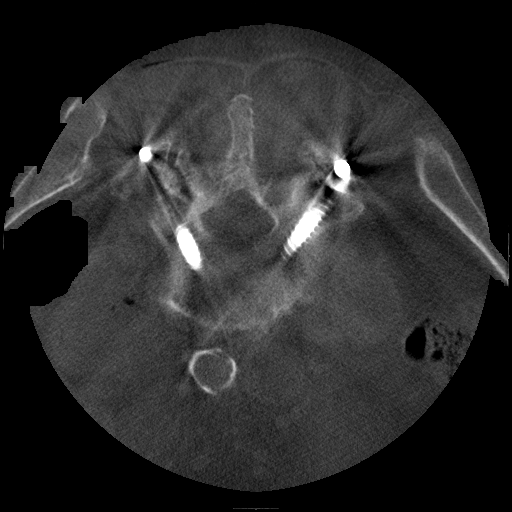
[im 118/170  bone]
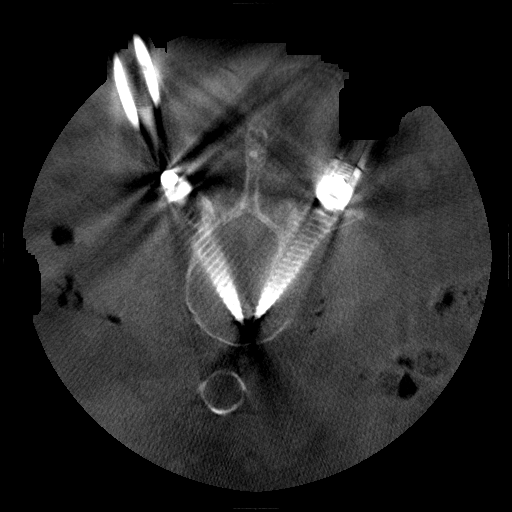
[im 131/170  bone]
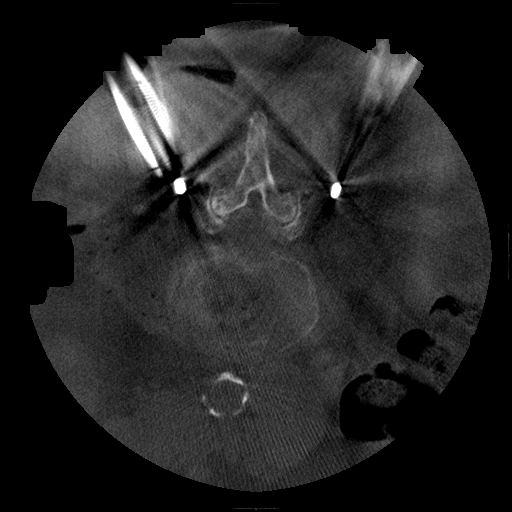
[im 157/170  bone]
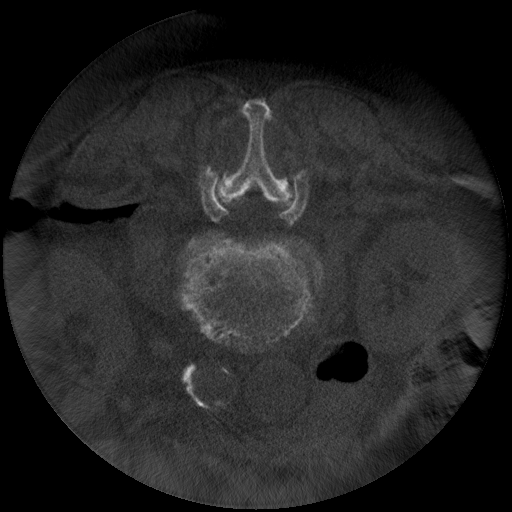

[Series 7: — · coronal · 0.36mm/px · 3 of 131 slices shown (3 of 3)]
[im 27/131  bone]
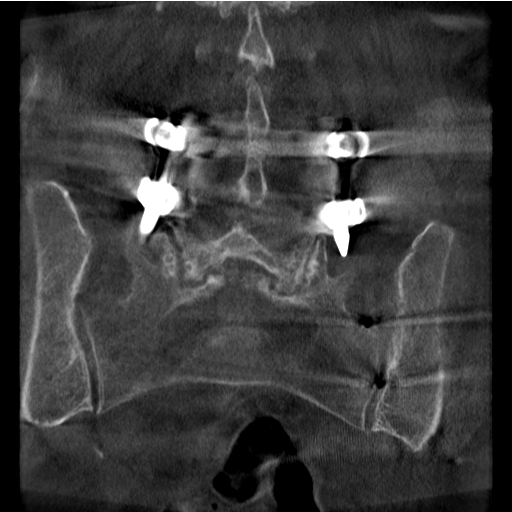
[im 53/131  bone]
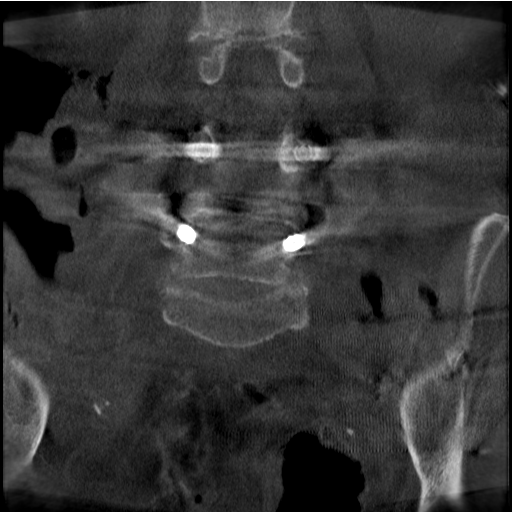
[im 79/131  bone]
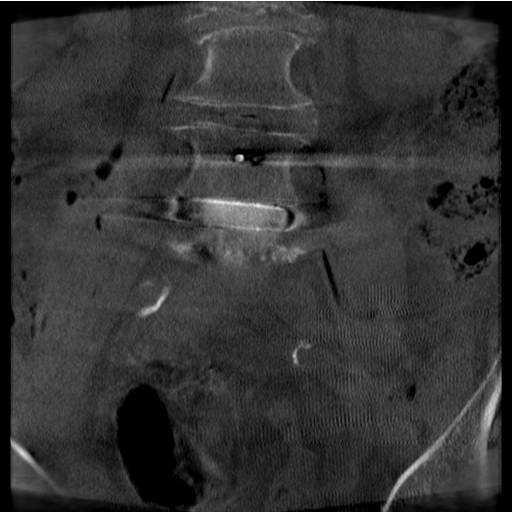

[16 of 33 positions shown; findings below may reference images not displayed]

FINDINGS: AP and lateral C-arm images of the lumbar spine were obtained. These
reveal interval placement of interbody spacer in the L4-5 disc space
in good position. Bilateral pedicle screw and rod fusion also at
L4-5.
IMPRESSION: Satisfactory pedicle screw and interbody fusion L4-5.

## 2021-12-10 SURGERY — ANTERIOR LATERAL LUMBAR FUSION WITH PERCUTANEOUS SCREW 1 LEVEL
Anesthesia: General | Site: Back

## 2021-12-10 MED ORDER — HYDROMORPHONE HCL 1 MG/ML IJ SOLN
INTRAMUSCULAR | Status: AC
  Administered 2021-12-10: 0.5 mg via INTRAVENOUS
  Filled 2021-12-10: qty 1

## 2021-12-10 MED ORDER — 0.9 % SODIUM CHLORIDE (POUR BTL) OPTIME
TOPICAL | Status: DC | PRN
  Administered 2021-12-10: 500 mL

## 2021-12-10 MED ORDER — OXYCODONE HCL 5 MG PO TABS
5.0000 mg | ORAL_TABLET | Freq: Once | ORAL | Status: AC | PRN
  Administered 2021-12-10: 5 mg via ORAL

## 2021-12-10 MED ORDER — OXYCODONE HCL 5 MG PO TABS: 10.0000 mg | ORAL_TABLET | ORAL | Status: DC | PRN

## 2021-12-10 MED ORDER — GLYCOPYRROLATE 0.2 MG/ML IJ SOLN
INTRAMUSCULAR | Status: DC | PRN
  Administered 2021-12-10: .2 mg via INTRAVENOUS

## 2021-12-10 MED ORDER — FENTANYL CITRATE (PF) 100 MCG/2ML IJ SOLN
25.0000 ug | INTRAMUSCULAR | Status: AC | PRN
  Administered 2021-12-10: 50 ug via INTRAVENOUS
  Administered 2021-12-10 (×3): 25 ug via INTRAVENOUS

## 2021-12-10 MED ORDER — SENNA 8.6 MG PO TABS
1.0000 | ORAL_TABLET | Freq: Two times a day (BID) | ORAL | Status: DC
  Administered 2021-12-10 – 2021-12-12 (×5): 8.6 mg via ORAL
  Filled 2021-12-10 (×5): qty 1

## 2021-12-10 MED ORDER — APREPITANT 40 MG PO CAPS: 40.0000 mg | ORAL_CAPSULE | Freq: Once | ORAL | Status: AC

## 2021-12-10 MED ORDER — SODIUM CHLORIDE 0.9% FLUSH: 3.0000 mL | INTRAVENOUS | Status: DC | PRN

## 2021-12-10 MED ORDER — APREPITANT 40 MG PO CAPS
ORAL_CAPSULE | ORAL | Status: AC
  Administered 2021-12-10: 40 mg via ORAL
  Filled 2021-12-10: qty 1

## 2021-12-10 MED ORDER — KETAMINE HCL 50 MG/5ML IJ SOSY
PREFILLED_SYRINGE | INTRAMUSCULAR | Status: AC
  Filled 2021-12-10: qty 5

## 2021-12-10 MED ORDER — BISACODYL 10 MG RE SUPP: 10.0000 mg | Freq: Every day | RECTAL | Status: DC | PRN

## 2021-12-10 MED ORDER — ENOXAPARIN SODIUM 40 MG/0.4ML IJ SOSY
40.0000 mg | PREFILLED_SYRINGE | INTRAMUSCULAR | Status: DC
  Administered 2021-12-11 – 2021-12-12 (×2): 40 mg via SUBCUTANEOUS
  Filled 2021-12-10 (×2): qty 0.4

## 2021-12-10 MED ORDER — METHOCARBAMOL 1000 MG/10ML IJ SOLN
500.0000 mg | Freq: Four times a day (QID) | INTRAVENOUS | Status: DC | PRN
  Filled 2021-12-10: qty 5

## 2021-12-10 MED ORDER — BUPIVACAINE-EPINEPHRINE (PF) 0.5% -1:200000 IJ SOLN
INTRAMUSCULAR | Status: AC
  Filled 2021-12-10: qty 60

## 2021-12-10 MED ORDER — FLEET ENEMA 7-19 GM/118ML RE ENEM: 1.0000 | ENEMA | Freq: Once | RECTAL | Status: DC | PRN

## 2021-12-10 MED ORDER — DEXAMETHASONE SODIUM PHOSPHATE 10 MG/ML IJ SOLN
INTRAMUSCULAR | Status: DC | PRN
Start: 2021-12-10 — End: 2021-12-10
  Administered 2021-12-10: 10 mg via INTRAVENOUS

## 2021-12-10 MED ORDER — ONDANSETRON HCL 4 MG/2ML IJ SOLN
INTRAMUSCULAR | Status: DC | PRN
  Administered 2021-12-10: 4 mg via INTRAVENOUS

## 2021-12-10 MED ORDER — LIDOCAINE HCL (CARDIAC) PF 100 MG/5ML IV SOSY
PREFILLED_SYRINGE | INTRAVENOUS | Status: DC | PRN
Start: 2021-12-10 — End: 2021-12-10
  Administered 2021-12-10: 50 mg via INTRAVENOUS

## 2021-12-10 MED ORDER — SODIUM CHLORIDE 0.9% FLUSH
3.0000 mL | Freq: Two times a day (BID) | INTRAVENOUS | Status: DC
  Administered 2021-12-10 – 2021-12-11 (×3): 3 mL via INTRAVENOUS

## 2021-12-10 MED ORDER — ORAL CARE MOUTH RINSE: 15.0000 mL | Freq: Once | OROMUCOSAL | Status: AC

## 2021-12-10 MED ORDER — PROPOFOL 1000 MG/100ML IV EMUL
INTRAVENOUS | Status: AC
  Filled 2021-12-10: qty 100

## 2021-12-10 MED ORDER — OXYCODONE HCL 5 MG PO TABS
5.0000 mg | ORAL_TABLET | ORAL | Status: DC | PRN
  Administered 2021-12-10 (×2): 5 mg via ORAL
  Filled 2021-12-10 (×2): qty 1

## 2021-12-10 MED ORDER — ONDANSETRON HCL 4 MG PO TABS
4.0000 mg | ORAL_TABLET | Freq: Four times a day (QID) | ORAL | Status: DC | PRN
  Administered 2021-12-11: 4 mg via ORAL
  Filled 2021-12-10: qty 1

## 2021-12-10 MED ORDER — FENTANYL CITRATE (PF) 100 MCG/2ML IJ SOLN
INTRAMUSCULAR | Status: AC
  Filled 2021-12-10: qty 2

## 2021-12-10 MED ORDER — KETOROLAC TROMETHAMINE 15 MG/ML IJ SOLN
7.5000 mg | Freq: Four times a day (QID) | INTRAMUSCULAR | Status: AC
  Administered 2021-12-11 (×3): 7.5 mg via INTRAVENOUS
  Filled 2021-12-10 (×4): qty 1

## 2021-12-10 MED ORDER — BUPIVACAINE LIPOSOME 1.3 % IJ SUSP
INTRAMUSCULAR | Status: AC
  Filled 2021-12-10: qty 40

## 2021-12-10 MED ORDER — SODIUM CHLORIDE 0.9 % IV SOLN: 250.0000 mL | INTRAVENOUS | Status: DC

## 2021-12-10 MED ORDER — KETOROLAC TROMETHAMINE 30 MG/ML IJ SOLN
INTRAMUSCULAR | Status: DC | PRN
  Administered 2021-12-10: 15 mg via INTRAVENOUS

## 2021-12-10 MED ORDER — SODIUM CHLORIDE 0.9 % IV SOLN: INTRAVENOUS | Status: DC

## 2021-12-10 MED ORDER — FAMOTIDINE 20 MG PO TABS: 20.0000 mg | ORAL_TABLET | Freq: Once | ORAL | Status: AC

## 2021-12-10 MED ORDER — LACTATED RINGERS IV SOLN: INTRAVENOUS | Status: DC

## 2021-12-10 MED ORDER — REMIFENTANIL HCL 1 MG IV SOLR
INTRAVENOUS | Status: AC
  Filled 2021-12-10: qty 1000

## 2021-12-10 MED ORDER — ACETAMINOPHEN 10 MG/ML IV SOLN
INTRAVENOUS | Status: DC | PRN
  Administered 2021-12-10: 1000 mg via INTRAVENOUS

## 2021-12-10 MED ORDER — KETAMINE HCL 10 MG/ML IJ SOLN
INTRAMUSCULAR | Status: DC | PRN
  Administered 2021-12-10 (×2): 10 mg via INTRAVENOUS
  Administered 2021-12-10: 30 mg via INTRAVENOUS

## 2021-12-10 MED ORDER — ACETAMINOPHEN 500 MG PO TABS
1000.0000 mg | ORAL_TABLET | Freq: Four times a day (QID) | ORAL | Status: DC
  Administered 2021-12-10 – 2021-12-11 (×5): 1000 mg via ORAL
  Filled 2021-12-10 (×6): qty 2

## 2021-12-10 MED ORDER — PROPOFOL 10 MG/ML IV BOLUS
INTRAVENOUS | Status: DC | PRN
  Administered 2021-12-10: 50 mg via INTRAVENOUS
  Administered 2021-12-10: 100 mg via INTRAVENOUS
  Administered 2021-12-10: 50 mg via INTRAVENOUS

## 2021-12-10 MED ORDER — OXYCODONE HCL 5 MG/5ML PO SOLN: 5.0000 mg | Freq: Once | ORAL | Status: AC | PRN

## 2021-12-10 MED ORDER — HYDROMORPHONE HCL 1 MG/ML IJ SOLN
0.5000 mg | INTRAMUSCULAR | Status: AC | PRN
  Administered 2021-12-10 (×2): 0.5 mg via INTRAVENOUS

## 2021-12-10 MED ORDER — PHENOL 1.4 % MT LIQD
1.0000 | OROMUCOSAL | Status: DC | PRN
  Filled 2021-12-10: qty 177

## 2021-12-10 MED ORDER — SUCCINYLCHOLINE CHLORIDE 200 MG/10ML IV SOSY
PREFILLED_SYRINGE | INTRAVENOUS | Status: DC | PRN
  Administered 2021-12-10: 120 mg via INTRAVENOUS

## 2021-12-10 MED ORDER — SODIUM CHLORIDE FLUSH 0.9 % IV SOLN
INTRAVENOUS | Status: AC
  Filled 2021-12-10: qty 40

## 2021-12-10 MED ORDER — BUPIVACAINE HCL (PF) 0.5 % IJ SOLN
INTRAMUSCULAR | Status: AC
  Filled 2021-12-10: qty 60

## 2021-12-10 MED ORDER — ONDANSETRON HCL 4 MG/2ML IJ SOLN: 4.0000 mg | Freq: Four times a day (QID) | INTRAMUSCULAR | Status: DC | PRN

## 2021-12-10 MED ORDER — DEXMEDETOMIDINE (PRECEDEX) IN NS 20 MCG/5ML (4 MCG/ML) IV SYRINGE
PREFILLED_SYRINGE | INTRAVENOUS | Status: DC | PRN
  Administered 2021-12-10 (×3): 4 ug via INTRAVENOUS

## 2021-12-10 MED ORDER — KETOROLAC TROMETHAMINE 15 MG/ML IJ SOLN
INTRAMUSCULAR | Status: AC
  Administered 2021-12-10: 7.5 mg via INTRAVENOUS
  Filled 2021-12-10: qty 1

## 2021-12-10 MED ORDER — FENTANYL CITRATE (PF) 100 MCG/2ML IJ SOLN
INTRAMUSCULAR | Status: DC | PRN
  Administered 2021-12-10: 50 ug via INTRAVENOUS

## 2021-12-10 MED ORDER — DEXAMETHASONE SODIUM PHOSPHATE 10 MG/ML IJ SOLN
INTRAMUSCULAR | Status: AC
  Filled 2021-12-10: qty 1

## 2021-12-10 MED ORDER — FENTANYL CITRATE (PF) 100 MCG/2ML IJ SOLN
INTRAMUSCULAR | Status: AC
  Administered 2021-12-10: 25 ug via INTRAVENOUS
  Filled 2021-12-10: qty 2

## 2021-12-10 MED ORDER — MENTHOL 3 MG MT LOZG
1.0000 | LOZENGE | OROMUCOSAL | Status: DC | PRN
  Filled 2021-12-10: qty 9

## 2021-12-10 MED ORDER — PROPOFOL 500 MG/50ML IV EMUL
INTRAVENOUS | Status: DC | PRN
  Administered 2021-12-10: 100 ug/kg/min via INTRAVENOUS

## 2021-12-10 MED ORDER — POLYVINYL ALCOHOL 1.4 % OP SOLN
1.0000 [drp] | Freq: Every day | OPHTHALMIC | Status: DC | PRN
  Filled 2021-12-10: qty 15

## 2021-12-10 MED ORDER — FAMOTIDINE 20 MG PO TABS
ORAL_TABLET | ORAL | Status: AC
  Administered 2021-12-10: 20 mg via ORAL
  Filled 2021-12-10: qty 1

## 2021-12-10 MED ORDER — SUCCINYLCHOLINE CHLORIDE 200 MG/10ML IV SOSY
PREFILLED_SYRINGE | INTRAVENOUS | Status: AC
  Filled 2021-12-10: qty 10

## 2021-12-10 MED ORDER — CELECOXIB 200 MG PO CAPS
200.0000 mg | ORAL_CAPSULE | Freq: Two times a day (BID) | ORAL | Status: DC
  Administered 2021-12-10 – 2021-12-12 (×5): 200 mg via ORAL
  Filled 2021-12-10 (×5): qty 1

## 2021-12-10 MED ORDER — REMIFENTANIL HCL 1 MG IV SOLR
INTRAVENOUS | Status: DC | PRN
Start: 2021-12-10 — End: 2021-12-10
  Administered 2021-12-10: .1 ug/kg/min via INTRAVENOUS

## 2021-12-10 MED ORDER — METHOCARBAMOL 500 MG PO TABS
500.0000 mg | ORAL_TABLET | Freq: Four times a day (QID) | ORAL | Status: DC | PRN
  Administered 2021-12-10 – 2021-12-12 (×2): 500 mg via ORAL
  Filled 2021-12-10 (×2): qty 1

## 2021-12-10 MED ORDER — DEXMEDETOMIDINE (PRECEDEX) IN NS 20 MCG/5ML (4 MCG/ML) IV SYRINGE
PREFILLED_SYRINGE | INTRAVENOUS | Status: AC
  Filled 2021-12-10: qty 5

## 2021-12-10 MED ORDER — GLYCOPYRROLATE 0.2 MG/ML IJ SOLN
INTRAMUSCULAR | Status: AC
  Filled 2021-12-10: qty 1

## 2021-12-10 MED ORDER — AMLODIPINE BESYLATE 10 MG PO TABS
10.0000 mg | ORAL_TABLET | Freq: Every day | ORAL | Status: DC
Start: 2021-12-10 — End: 2021-12-12
  Administered 2021-12-10 – 2021-12-12 (×3): 10 mg via ORAL
  Filled 2021-12-10 (×3): qty 1

## 2021-12-10 MED ORDER — CHLORHEXIDINE GLUCONATE 0.12 % MT SOLN: 15.0000 mL | Freq: Once | OROMUCOSAL | Status: AC

## 2021-12-10 MED ORDER — ACETAMINOPHEN 10 MG/ML IV SOLN
INTRAVENOUS | Status: AC
  Filled 2021-12-10: qty 100

## 2021-12-10 MED ORDER — POLYETHYLENE GLYCOL 3350 17 G PO PACK: 17.0000 g | PACK | Freq: Every day | ORAL | Status: DC | PRN

## 2021-12-10 MED ORDER — VANCOMYCIN HCL IN DEXTROSE 1-5 GM/200ML-% IV SOLN
INTRAVENOUS | Status: AC
  Administered 2021-12-10: 1000 mg via INTRAVENOUS
  Filled 2021-12-10: qty 200

## 2021-12-10 MED ORDER — CHLORHEXIDINE GLUCONATE 0.12 % MT SOLN
OROMUCOSAL | Status: AC
  Administered 2021-12-10: 15 mL via OROMUCOSAL
  Filled 2021-12-10: qty 15

## 2021-12-10 MED ORDER — OXYCODONE HCL 5 MG PO TABS
ORAL_TABLET | ORAL | Status: AC
  Filled 2021-12-10: qty 1

## 2021-12-10 MED ORDER — SODIUM CHLORIDE (PF) 0.9 % IJ SOLN
INTRAMUSCULAR | Status: DC | PRN
  Administered 2021-12-10: 60 mL

## 2021-12-10 MED ORDER — CEFAZOLIN SODIUM-DEXTROSE 2-4 GM/100ML-% IV SOLN
INTRAVENOUS | Status: AC
  Filled 2021-12-10: qty 100

## 2021-12-10 MED ORDER — VITAMIN D 25 MCG (1000 UNIT) PO TABS
1000.0000 [IU] | ORAL_TABLET | Freq: Two times a day (BID) | ORAL | Status: DC
  Administered 2021-12-10 – 2021-12-12 (×5): 1000 [IU] via ORAL
  Filled 2021-12-10 (×5): qty 1

## 2021-12-10 MED ORDER — VITAMIN B-12 1000 MCG PO TABS
1000.0000 ug | ORAL_TABLET | Freq: Two times a day (BID) | ORAL | Status: DC
  Administered 2021-12-10 – 2021-12-12 (×5): 1000 ug via ORAL
  Filled 2021-12-10 (×5): qty 1

## 2021-12-10 MED ORDER — LIDOCAINE HCL (PF) 2 % IJ SOLN
INTRAMUSCULAR | Status: AC
  Filled 2021-12-10: qty 5

## 2021-12-10 MED ORDER — VANCOMYCIN HCL IN DEXTROSE 1-5 GM/200ML-% IV SOLN
1000.0000 mg | Freq: Once | INTRAVENOUS | Status: AC
  Administered 2021-12-10: 1000 mg via INTRAVENOUS

## 2021-12-10 MED ORDER — MORPHINE SULFATE (PF) 2 MG/ML IV SOLN: 1.0000 mg | INTRAVENOUS | Status: DC | PRN

## 2021-12-10 MED ORDER — FENTANYL CITRATE (PF) 100 MCG/2ML IJ SOLN
INTRAMUSCULAR | Status: AC
  Administered 2021-12-10: 50 ug via INTRAVENOUS
  Filled 2021-12-10: qty 2

## 2021-12-10 MED ORDER — ONDANSETRON HCL 4 MG/2ML IJ SOLN
INTRAMUSCULAR | Status: AC
  Filled 2021-12-10: qty 2

## 2021-12-10 MED ORDER — BUPIVACAINE-EPINEPHRINE 0.5% -1:200000 IJ SOLN
INTRAMUSCULAR | Status: DC | PRN
  Administered 2021-12-10: 3 mL
  Administered 2021-12-10: 9 mL

## 2021-12-10 MED ORDER — SURGIFLO WITH THROMBIN (HEMOSTATIC MATRIX KIT) OPTIME
TOPICAL | Status: DC | PRN
  Administered 2021-12-10: 1 via TOPICAL

## 2021-12-10 MED ORDER — CEFAZOLIN SODIUM-DEXTROSE 2-4 GM/100ML-% IV SOLN
2.0000 g | Freq: Once | INTRAVENOUS | Status: AC
  Administered 2021-12-10: 2 g via INTRAVENOUS

## 2021-12-10 MED ORDER — ASCORBIC ACID 500 MG PO TABS
1000.0000 mg | ORAL_TABLET | Freq: Every day | ORAL | Status: DC
  Administered 2021-12-10 – 2021-12-12 (×3): 1000 mg via ORAL
  Filled 2021-12-10 (×3): qty 2

## 2021-12-10 MED ORDER — SODIUM CHLORIDE (PF) 0.9 % IJ SOLN
INTRAMUSCULAR | Status: AC
  Filled 2021-12-10: qty 20

## 2021-12-10 SURGICAL SUPPLY — 88 items
BUR NEURO DRILL SOFT 3.0X3.8M (BURR) IMPLANT
CAP LOCKING SPINE (Cap) ×4 IMPLANT
CHLORAPREP W/TINT 26 (MISCELLANEOUS) ×12 IMPLANT
CORD BIP STRL DISP 12FT (MISCELLANEOUS) ×4 IMPLANT
CORD LIGHT LATERIAL X LIFT (MISCELLANEOUS) ×1 IMPLANT
COUNTER NEEDLE 20/40 LG (NEEDLE) ×6 IMPLANT
COVER BACK TABLE REUSABLE LG (DRAPES) ×3 IMPLANT
CUP MEDICINE 2OZ PLAST GRAD ST (MISCELLANEOUS) ×3 IMPLANT
DERMABOND ADVANCED (GAUZE/BANDAGES/DRESSINGS) ×2
DERMABOND ADVANCED .7 DNX12 (GAUZE/BANDAGES/DRESSINGS) ×4 IMPLANT
DRAPE 3D C-ARM OEC (DRAPES) IMPLANT
DRAPE C ARM PK CFD 31 SPINE (DRAPES) ×3 IMPLANT
DRAPE C-ARMOR (DRAPES) ×5 IMPLANT
DRAPE INCISE IOBAN 66X45 STRL (DRAPES) ×6 IMPLANT
DRAPE LAPAROTOMY 100X77 ABD (DRAPES) ×6 IMPLANT
DRAPE MICROSCOPE SPINE 48X150 (DRAPES) IMPLANT
DRAPE SCAN PATIENT (DRAPES) ×3 IMPLANT
DRAPE SURG 17X11 SM STRL (DRAPES) ×18 IMPLANT
DRSG OPSITE POSTOP 3X4 (GAUZE/BANDAGES/DRESSINGS) ×1 IMPLANT
DRSG OPSITE POSTOP 4X6 (GAUZE/BANDAGES/DRESSINGS) ×1 IMPLANT
DRSG OPSITE POSTOP 4X8 (GAUZE/BANDAGES/DRESSINGS) ×1 IMPLANT
DRSG TEGADERM 2-3/8X2-3/4 SM (GAUZE/BANDAGES/DRESSINGS) IMPLANT
DRSG TEGADERM 4X4.75 (GAUZE/BANDAGES/DRESSINGS) IMPLANT
DRSG TEGADERM 6X8 (GAUZE/BANDAGES/DRESSINGS) IMPLANT
DRSG TELFA 3X8 NADH (GAUZE/BANDAGES/DRESSINGS) IMPLANT
DRSG TELFA 4X3 1S NADH ST (GAUZE/BANDAGES/DRESSINGS) IMPLANT
ELECT CAUTERY BLADE TIP 2.5 (TIP) ×3
ELECT EZSTD 165MM 6.5IN (MISCELLANEOUS) ×3
ELECT REM PT RETURN 9FT ADLT (ELECTROSURGICAL) ×6
ELECTRODE CAUTERY BLDE TIP 2.5 (TIP) ×2 IMPLANT
ELECTRODE EZSTD 165MM 6.5IN (MISCELLANEOUS) ×2 IMPLANT
ELECTRODE REM PT RTRN 9FT ADLT (ELECTROSURGICAL) ×4 IMPLANT
EX-PIN ORTHOLOCK NAV 4X150 (PIN) ×1 IMPLANT
FEE INTRAOP CADWELL SUPPLY NCS (MISCELLANEOUS) ×2 IMPLANT
FEE INTRAOP MONITOR IMPULS NCS (MISCELLANEOUS) IMPLANT
FORCEPS BPLR BAYO 10IN 1.0TIP (ORTHOPEDIC DISPOSABLE SUPPLIES) ×1 IMPLANT
GAUZE 4X4 16PLY ~~LOC~~+RFID DBL (SPONGE) ×4 IMPLANT
GLOVE SURG SYN 6.5 ES PF (GLOVE) ×3 IMPLANT
GLOVE SURG SYN 6.5 PF PI (GLOVE) ×2 IMPLANT
GLOVE SURG SYN 8.5  E (GLOVE) ×6
GLOVE SURG SYN 8.5 E (GLOVE) ×12 IMPLANT
GLOVE SURG SYN 8.5 PF PI (GLOVE) ×12 IMPLANT
GLOVE SURG UNDER POLY LF SZ6.5 (GLOVE) ×3 IMPLANT
GOWN SRG LRG LVL 4 IMPRV REINF (GOWNS) ×2 IMPLANT
GOWN SRG XL LVL 3 NONREINFORCE (GOWNS) ×4 IMPLANT
GOWN STRL NON-REIN TWL XL LVL3 (GOWNS) ×2
GOWN STRL REIN LRG LVL4 (GOWNS) ×1
GRADUATE 1200CC STRL 31836 (MISCELLANEOUS) ×3 IMPLANT
INTRAOP CADWELL SUPPLY FEE NCS (MISCELLANEOUS) ×2
INTRAOP DISP SUPPLY FEE NCS (MISCELLANEOUS) ×1
INTRAOP MONITOR FEE IMPULS NCS (MISCELLANEOUS) ×2
INTRAOP MONITOR FEE IMPULSE (MISCELLANEOUS) ×1
K-WIRE 1.6 NITINOL SHARP TIP (WIRE) ×12
KIT DISP MARS 3V (KITS) ×1 IMPLANT
KIT PEDICLE ACCESS (KITS) ×1 IMPLANT
KIT SPINAL PRONEVIEW (KITS) ×3 IMPLANT
KIT TURNOVER KIT A (KITS) ×3 IMPLANT
KNIFE BAYONET SHORT DISCETOMY (MISCELLANEOUS) IMPLANT
KWIRE 1.6 NITINOL SHARP TIP (WIRE) IMPLANT
MANIFOLD NEPTUNE II (INSTRUMENTS) ×3 IMPLANT
MARKER SKIN DUAL TIP RULER LAB (MISCELLANEOUS) ×9 IMPLANT
MARKER SPHERE PSV REFLC 13MM (MARKER) ×16 IMPLANT
NDL SAFETY ECLIPSE 18X1.5 (NEEDLE) ×2 IMPLANT
NEEDLE HYPO 18GX1.5 SHARP (NEEDLE) ×1
NEEDLE HYPO 22GX1.5 SAFETY (NEEDLE) ×3 IMPLANT
PACK LAMINECTOMY NEURO (CUSTOM PROCEDURE TRAY) ×3 IMPLANT
PAD ARMBOARD 7.5X6 YLW CONV (MISCELLANEOUS) ×6 IMPLANT
PAD DRESSING TELFA 3X8 NADH (GAUZE/BANDAGES/DRESSINGS) IMPLANT
PENCIL ELECTRO HAND CTR (MISCELLANEOUS) ×3 IMPLANT
PUTTY DBX 5CC (Putty) ×1 IMPLANT
ROD SPINE CURVE 5.5X50 (Rod) ×2 IMPLANT
SCREW CREO 6.5X45 FEN (Screw) ×4 IMPLANT
SCREW CREO MIS 30 TULIP (Screw) ×4 IMPLANT
SPACER HEDRON 18X40X11 10D (Spacer) ×1 IMPLANT
SPONGE GAUZE 2X2 8PLY STRL LF (GAUZE/BANDAGES/DRESSINGS) IMPLANT
STAPLER SKIN PROX 35W (STAPLE) IMPLANT
SURGIFLO W/THROMBIN 8M KIT (HEMOSTASIS) ×3 IMPLANT
SUT DVC VLOC 3-0 CL 6 P-12 (SUTURE) ×5 IMPLANT
SUT ETHILON 3-0 FS-10 30 BLK (SUTURE)
SUT VIC AB 0 CT1 27 (SUTURE) ×2
SUT VIC AB 0 CT1 27XCR 8 STRN (SUTURE) ×2 IMPLANT
SUT VIC AB 2-0 CT1 18 (SUTURE) ×5 IMPLANT
SUTURE EHLN 3-0 FS-10 30 BLK (SUTURE) IMPLANT
SYR 30ML LL (SYRINGE) ×6 IMPLANT
TOWEL OR 17X26 4PK STRL BLUE (TOWEL DISPOSABLE) ×9 IMPLANT
TRAY FOLEY MTR SLVR 16FR STAT (SET/KITS/TRAYS/PACK) ×1 IMPLANT
TUBING CONNECTING 10 (TUBING) ×9 IMPLANT
WATER STERILE IRR 500ML POUR (IV SOLUTION) ×2 IMPLANT

## 2021-12-10 NOTE — Op Note (Signed)
Indications: Jane Willis is a 81 yo female who presented with: M43.16-Spondylolisthesis of lumbar region, M43.10-Anterolisthesis, M48.062-Neurogenic claudication due to lumbar spinal stenosis , M54.42-Chronic bilateral low back pain with left-sided sciatica , G89.29-Chronic bilateral low back pain with left-sided sciatica  She failed conservative management prompting surgical intervention.  Findings: correction of anterolisthesis  Preoperative Diagnosis: M43.16-Spondylolisthesis of lumbar region, M43.10-Anterolisthesis, M48.062-Neurogenic claudication due to lumbar spinal stenosis , M54.42-Chronic bilateral low back pain with left-sided sciatica , G89.29-Chronic bilateral low back pain with left-sided sciatica Postoperative Diagnosis: same   EBL: 25 ml IVF: see AR ml Drains: none Disposition: Extubated and Stable to PACU Complications: none  No foley catheter was placed.   Preoperative Note:   Risks of surgery discussed include: infection, bleeding, stroke, coma, death, paralysis, CSF leak, nerve/spinal cord injury, numbness, tingling, weakness, complex regional pain syndrome, recurrent stenosis and/or disc herniation, vascular injury, development of instability, neck/back pain, need for further surgery, persistent symptoms, development of deformity, and the risks of anesthesia. The patient understood these risks and agreed to proceed.  NAME OF ANTERIOR PROCEDURE:               1. Anterior lumbar interbody fusion via a left lateral retroperitoneal approach at L4/5 2. Placement of a Lordotic Globus Hedron interbody cage, filled with Demineralized Bone Matrix   NAME OF POSTERIOR PROCEDURE: 1. Posterior instrumentation using Globus Creo Instrumentation 2. Posterolateral fusion, L4-5      PROCEDURE:  Patient was brought to the operating room, intubated, turned to the lateral position.  All pressure points were checked and double-checked.  The patient was prepped and draped in the  standard fashion. Prior to prepping, fluoroscopy was brought in and the patient was positioned with a large bump under the contralateral side between the iliac crest and rib cage, allowing the area between the iliac crest and the lateral aspect of the rib cage to open and increase the ability to reach inferiorly, to facilitate entry into the disc space.  The incision was marked upon the skin both the location of the disc space as well as the superior most aspect of the iliac crest.  Based on the identification of the disc space an incision was prepared, marked upon the skin and eventually was used for our lateral incision.  The fluoroscopy was turned into a cross table A/P image in order to confirm that the patients spine remained in a perpendicular trajectory to the floor without rotation.  Once confirming that all the pressure points were checked and double-checked and the patient remained in sturdy position strapped down in this slightly jack-knifed lateral position, the patient was prepped and draped in standard fashion.  The skin was injected with local anesthetic, then incised until the abdominal wall fascia was noted.  I bluntly dissected posteriorly until we were able to identify the posterior musculature near petits triangle.  At this point, using primarily blunt dissection with our finger aided with a metzenbaum scissor, were able to enter the retroperitoneal cavity.  The retroperitoneal potential space was opened further until palpating out the psoas muscle, the medial aspect of the iliac crest, the medial aspect of the last rib and continued to define the retroperitoneal space with blunt dissection in order to facilitate safe placement of our dilators.    While protecting by dissecting directly onto a finger in the retroperitoneum, the retroperitoneal space was entered safely from the lateral incision and the initial dilator placed onto the muscle belly of the psoas.  While directly stimulating  the  dilator and after radiographically confirming our location relative to the disc space, I placed the dilator through the psoas.  The dilators were stimulated to ensure remaining safely away from any of the lumbar plexus nerves; the dilators were repositioned until no pathologic stimulation was appreciated.  Once I had confirmed the location of our initial dilator radiographically, a K-wire secured the dilator into the L4/5 disc space and confirmed position under A/P and lateral fluoroscopy.  At this point, I dilated up with direct stimulation to confirm lack of pathologic stimulation.  Once all the dilators were in position, I placed in the retractor and secured it onto the table, locked into position and confirmed under A/P and lateral fluoroscopy to confirm our approach angle to the disc space as well as location relative to the disc space.  I then placed the muscle stimulator in through the working channel down to the vertebral body, stimulating the entire lateral surface of the vertebral body and any of the visualized psoas muscle that was adjacent to the retractor, confirming again the safe passage to the psoas before we began performing the discectomy.  At this point, we began our discectomy at L4/5.  The disc was incised laterally throughout the extent of our exposure. Using a combination of pituitary rongeurs, Kerrison rongeurs, rasps, curettes of various sorts, we were able to begin to clean out the disc space.  Once we had cleaned out the majority of the disc space, we then cut the lateral annulus with a cob, breaking the lateral annual attachments on the contralateral side by subtly working the cob through the annulus while using flouroscopy.  Care was taken not to extend further than required after cutting the annular attachments.  After this had been performed, we prepared the endplates for placement of our graft, sized a graft to the disc space by serially dilating up in trial sizes until we  confirmed that our graft would be well positioned, allowing distraction while maintaining good grip.  This was confirmed under A/P and lateral fluoroscopy in order to ensure its placement as an eventual trial for placement of our final graft.  We irrigated with bacteriostatic saline.  Once confirmed placement, the Hedron implant filled with allograft was impacted into position at L4/5.   Through a combination of intradiscal distraction and anterior releasing, we were able to correct the anterior deformity during disc preparation and placement of the graft.  At this point, final radiographs were performed, and we began closure.  The wound was closed using 0 Vicryl interrupted suture in the fascia and 2-0 Vicryl inverted suture were placed in the subcutaneous tissue and dermis. 3-0 monocryl was used for final closure. Dermabond was used to close the skin.    After closing the anterior part in layers, the patient was repositioned into prone position.  All pressure points were checked and double-checked.  The posterior operative site was prepped and draped in standard fashion.  The stereotactic array was placed.  Stereotactic images were acquired using intraoperative CT scanning.  This was registered to the patient.  Using stereotaxis, screw trajectories were planned and incisions made.  The pedicles from L4 to L5 were cannulated bilaterally and K wires used to secure the tracks.  We then utilized a stereotactic screwdriver to place pedicle screws from L4 to L5.  At each level, 6.5x47mm Globus Creo pedicle screws were placed.  Once the screws were placed, the screw extensions were then linked, a path was formed for the rod  and a rod was utilized to connect the screws.  We then compressed, torqued / counter-torqued and removed the screw assembly. Once performed on each side, the C-arm was brought back and to take confirmatory CT scan showing appropriate placement of all instrumentation and anatomic  alignment.    Posterolateral arthrodesis was performed at L4-L5.  Again we confirmed radiographically and began our closure.  The wound was closed using 0 Vicryl interrupted suture in the fascia, 2-0 Vicryl inverted suture were placed in the subcutaneous tissue and dermis. 3-0 monocryl was used for final closure. Dermabond was used to close the skin.    Needle, lap and all counts were correct at the end of the case.    There was no pathologic change in the neuromonitoring during the procedure.   Cooper Render PA assisted in the entire procedure.  Meade Maw MD Neurosurgery

## 2021-12-10 NOTE — H&P (Signed)
I have reviewed and confirmed my history and physical from 11/18/21 with no additions or changes. Plan for L4-5 XLIF/PSF.  Risks and benefits reviewed.  Heart sounds normal no MRG. Chest Clear to Auscultation Bilaterally.

## 2021-12-10 NOTE — TOC Progression Note (Signed)
Transition of Care [facility]) - Progression Note    Patient Details  Name: Jane Willis MRN: [id number] Date of Birth: Jul 28, 1941  Transition of Care [facility]) CM/SW Ney, RN Phone Number: 12/10/2021, 3:59 PM  Clinical Narrative:   TOC will continue to monitor the patient for needs and DC planning, PT to evaluate and make recommendations,          Expected Discharge Plan and Services                                                 Social Determinants of Health (SDOH) Interventions    Readmission Risk Interventions No flowsheet data found.

## 2021-12-10 NOTE — Anesthesia Procedure Notes (Signed)
Procedure Name: Intubation Date/Time: 12/10/2021 9:31 AM Performed by: Debe Coder, CRNA Pre-anesthesia Checklist: Patient identified, Emergency Drugs available, Suction available and Patient being monitored Patient Re-evaluated:Patient Re-evaluated prior to induction Oxygen Delivery Method: Circle system utilized Preoxygenation: Pre-oxygenation with 100% oxygen Induction Type: IV induction Ventilation: Mask ventilation without difficulty Laryngoscope Size: 3 Grade View: Grade I Tube type: Oral Tube size: 7.0 mm Number of attempts: 1 Airway Equipment and Method: Stylet and Oral airway Placement Confirmation: ETT inserted through vocal cords under direct vision, positive ETCO2 and breath sounds checked- equal and bilateral Secured at: 21 cm Tube secured with: Tape Dental Injury: Teeth and Oropharynx as per pre-operative assessment

## 2021-12-10 NOTE — Anesthesia Postprocedure Evaluation (Signed)
Anesthesia Post Note  Patient: Jane Willis  Procedure(s) Performed: L4-5 LATERAL INTERBODY FUSION WITH POSTERIOR PERCUTANEOUS FIXATION (Back) APPLICATION OF INTRAOPERATIVE CT SCAN  Patient location during evaluation: PACU Anesthesia Type: General Level of consciousness: awake and alert Pain management: pain level controlled Vital Signs Assessment: post-procedure vital signs reviewed and stable Respiratory status: spontaneous breathing, nonlabored ventilation, respiratory function stable and patient connected to nasal cannula oxygen Cardiovascular status: blood pressure returned to baseline and stable Postop Assessment: no apparent nausea or vomiting Anesthetic complications: no   No notable events documented.   Last Vitals:  Vitals:   12/10/21 1340 12/10/21 1449  BP:  (!) 169/79  Pulse: 60 (!) 55  Resp: 19 14  Temp:    SpO2: 91% 97%    Last Pain:  Vitals:   12/10/21 1340  TempSrc:   PainSc: 2                  Precious Haws Berklee Battey

## 2021-12-10 NOTE — Anesthesia Preprocedure Evaluation (Signed)
Anesthesia Evaluation  Patient identified by MRN, date of birth, ID band Patient awake    Reviewed: Allergy & Precautions, NPO status , Patient's Chart, lab work & pertinent test results  History of Anesthesia Complications (+) PONV and history of anesthetic complications  Airway Mallampati: III  TM Distance: <3 FB Neck ROM: full    Dental  (+) Chipped   Pulmonary neg pulmonary ROS, neg shortness of breath,    Pulmonary exam normal        Cardiovascular Exercise Tolerance: Good hypertension, (-) angina(-) Past MI Normal cardiovascular exam     Neuro/Psych negative neurological ROS  negative psych ROS   GI/Hepatic negative GI ROS, Neg liver ROS,   Endo/Other  negative endocrine ROS  Renal/GU      Musculoskeletal  (+) Arthritis ,   Abdominal   Peds  Hematology negative hematology ROS (+)   Anesthesia Other Findings Past Medical History: No date: Allergy     Comment:  Seasonal No date: Arthritis No date: Chicken pox No date: Hyperlipidemia No date: Hypertension No date: PONV (postoperative nausea and vomiting) No date: Urinary incontinence  Past Surgical History: 1986: ABDOMINAL HYSTERECTOMY 1970's: BREAST EXCISIONAL BIOPSY; Left     Comment:  benign 1968: BREAST SURGERY 2014/2015: CATARACT EXTRACTION     Comment:  Both eyes No date: EYE SURGERY 01/2009: FRACTURE SURGERY     Comment:  Fractured Left ankle, plate on outside of ankle 2 rods               through/across ankle from inside of ankle No date: OOPHORECTOMY 1960: TONSILLECTOMY AND ADENOIDECTOMY  BMI    Body Mass Index: 24.45 kg/m      Reproductive/Obstetrics negative OB ROS                             Anesthesia Physical Anesthesia Plan  ASA: 3  Anesthesia Plan: General ETT   Post-op Pain Management:    Induction: Intravenous  PONV Risk Score and Plan: Ondansetron, Dexamethasone, Midazolam and Treatment  may vary due to age or medical condition  Airway Management Planned: Oral ETT  Additional Equipment:   Intra-op Plan:   Post-operative Plan: Extubation in OR  Informed Consent: I have reviewed the patients History and Physical, chart, labs and discussed the procedure including the risks, benefits and alternatives for the proposed anesthesia with the patient or authorized representative who has indicated his/her understanding and acceptance.     Dental Advisory Given  Plan Discussed with: Anesthesiologist, CRNA and Surgeon  Anesthesia Plan Comments: (Patient consented for risks of anesthesia including but not limited to:  - adverse reactions to medications - damage to eyes, teeth, lips or other oral mucosa - nerve damage due to positioning  - sore throat or hoarseness - Damage to heart, brain, nerves, lungs, other parts of body or loss of life  Patient voiced understanding.)        Anesthesia Quick Evaluation

## 2021-12-10 NOTE — Transfer of Care (Signed)
Immediate Anesthesia Transfer of Care Note  Patient: Jane Willis  Procedure(s) Performed: Procedure(s): L4-5 LATERAL INTERBODY FUSION WITH POSTERIOR PERCUTANEOUS FIXATION (N/A) APPLICATION OF INTRAOPERATIVE CT SCAN (N/A)  Patient Location: PACU  Anesthesia Type:General  Level of Consciousness: sedated  Airway & Oxygen Therapy: Patient Spontanous Breathing and Patient connected to face mask oxygen  Post-op Assessment: Report given to RN and Post -op Vital signs reviewed and stable  Post vital signs: Reviewed and stable  Last Vitals:  Vitals:   12/10/21 1210 12/10/21 1215  BP: (!) 151/102   Pulse: 62 60  Resp: 16 17  Temp:    SpO2: 929% 244%    Complications: No apparent anesthesia complications

## 2021-12-11 ENCOUNTER — Encounter: Payer: Medicare Other | Admitting: Physical Therapy

## 2021-12-11 ENCOUNTER — Encounter: Payer: Self-pay | Admitting: Neurosurgery

## 2021-12-11 NOTE — Progress Notes (Signed)
16 Fr foley catheter was discontinued per order at this time.patient tolerated procedure without any c/o or discomfort.10cc of NS was drawn out from the balloon.Catheter tip was intact.patient encouraged to consume fluids and expected to void in 6 hrs per protocol.Pt able to express self,no verbal c/o or any ssx of distress during this time.will cont to monitor.CL is within reach.

## 2021-12-11 NOTE — Discharge Summary (Signed)
Physician Discharge Summary  Patient ID: Jane Willis MRN: [id number] DOB/AGE: [id number] 83 y.o.  Admit date: 12/10/2021 Discharge date: 12/12/2021  Admission Diagnoses: lumbar spondylolisthesis   Discharge Diagnoses:  Principal Problem:   S/P lumbar fusion   Discharged Condition: good  Hospital Course:  Draven Laine is an 81 year old presenting with lumbar spondylolisthesis status post L4-5 XLIF and PSF.  Her intraoperative course was uncomplicated and she was admitted for pain control and therapy.  She was seen by physical therapy and deemed appropriate for discharge home.  Her pain was well controlled with oral medications and she was able to resume ambulating and urinating independently.  She was discharged home on the morning of postop day 2 without further complication.  She was given prescriptions for Celebrex, oxycodone, tizanidine, and senna.  Consults: None  Significant Diagnostic Studies: none   Treatments: surgery: as above. Please see separately dictated operative report for further details  Discharge Exam: Blood pressure (!) 169/72, pulse 67, temperature 98.4 F (36.9 C), resp. rate 16, height 5\' 3"  (1.6 m), weight 62.6 kg, SpO2 97 %. A&Ox 3 5/5 throughout BLE  Incisions covered with postoperative bandages.  Disposition: Discharge disposition: 01-Home or Self Care       Discharge Instructions     Incentive spirometry RT   Complete by: As directed    Remove dressing in 24 hours   Complete by: As directed       Allergies as of 12/12/2021       Reactions   Gabapentin    Balance issues        Medication List     STOP taking these medications    aspirin 81 MG tablet       TAKE these medications    acetaminophen 500 MG tablet Commonly known as: TYLENOL Take 1,000 mg by mouth daily as needed for moderate pain.   amLODipine 10 MG tablet Commonly known as: NORVASC TAKE 1 TABLET(10 MG) BY MOUTH DAILY   celecoxib 200 MG  capsule Commonly known as: CELEBREX Take 1 capsule (200 mg total) by mouth every 12 (twelve) hours.   Citracal Maximum Plus Tabs Take 1 tablet by mouth 2 (two) times daily.   Fish Oil 1200 MG Caps Take 1,200 mg by mouth daily.   methylcellulose oral powder Take by mouth daily.   oxyCODONE 5 MG immediate release tablet Commonly known as: Oxy IR/ROXICODONE Take 1 tablet (5 mg total) by mouth every 3 (three) hours as needed for moderate pain ((score 4 to 6)).   senna 8.6 MG Tabs tablet Commonly known as: SENOKOT Take 1 tablet (8.6 mg total) by mouth daily as needed for mild constipation.   SYSTANE OP Place 1 drop into both eyes daily as needed (dry eyes).   tiZANidine 4 MG tablet Commonly known as: ZANAFLEX Take 1 tablet (4 mg total) by mouth every 8 (eight) hours as needed for muscle spasms.   vitamin B-12 1000 MCG tablet Commonly known as: CYANOCOBALAMIN Take 1,000 mcg by mouth 2 (two) times daily.   vitamin C 1000 MG tablet Take 1,000 mg by mouth daily.   Vitamin D-3 25 MCG (1000 UT) Caps Take 1,000 capsules by mouth 2 (two) times daily.         Signed: Loleta Dicker 12/12/2021, 8:51 AM

## 2021-12-11 NOTE — Progress Notes (Signed)
° °   Attending Progress Note  History: Jane Willis is s/p L4-5 XLIF. L4-5 PSF  POD1: NAEO. Reports good pain control. Was ambulating to the bedside commode this morning.  Physical Exam: Vitals:   12/11/21 0430 12/11/21 0720  BP: (!) 157/82 137/74  Pulse: 72 64  Resp: 18 16  Temp: 98.5 F (36.9 C) 98 F (36.7 C)  SpO2: 96% 97%    AA Ox3 CNI  Strength:5/5 throughout BLE Incisions with clean post-op bandages in place  Data:  Recent Labs  Lab 12/10/21 1415  CREATININE 0.98   No results for input(s): AST, ALT, ALKPHOS in the last 168 hours.  Invalid input(s): TBILI   Recent Labs  Lab 12/10/21 1415  WBC 10.2  HGB 11.9*  HCT 36.7  PLT 279   No results for input(s): APTT, INR in the last 168 hours.       Other tests/results: none   Assessment/Plan:  Jane Willis is a 81 year old status post L4-5 XLIF and L4-5 PSF for lumbar spondylolisthesis and neurogenic claudication due to spinal stenosis.  - mobilize - pain control - DVT prophylaxis - PTOT  Cooper Render PA-C Department of Neurosurgery

## 2021-12-11 NOTE — Evaluation (Signed)
Physical Therapy Evaluation Patient Details Name: Jane Willis MRN: [id number] DOB: [birth year] Today's Date: 12/11/2021  History of Present Illness  Pt admitted for elective lumbar fusion of L4-L5. POD 1 at time of evaluation.  Clinical Impression  Pt is a pleasant 81 year old female who was admitted for lumbar fusion of L4-L5.  Pt demonstrates all bed mobility/transfers/ambulation at baseline level. Encouraged to continue to use SPC at home. Pt does not require any further PT needs at this time while hospitalized. Encouraged to continue ambulation with RN staff and would benefit from OP PT when MD deems necessary. Pt will be dc in house and does not require follow up. RN aware. Will dc current orders.      Recommendations for follow up therapy are one component of a multi-disciplinary discharge planning process, led by the attending physician.  Recommendations may be updated based on patient status, additional functional criteria and insurance authorization.  Follow Up Recommendations Outpatient PT    Assistance Recommended at Discharge PRN  Patient can return home with the following  Assistance with cooking/housework    Equipment Recommendations None recommended by PT  Recommendations for Other Services       Functional Status Assessment Patient has not had a recent decline in their functional status     Precautions / Restrictions Precautions Precautions: Fall;Back Precaution Booklet Issued: No Required Braces or Orthoses:  (none) Restrictions Weight Bearing Restrictions: No      Mobility  Bed Mobility Overal bed mobility: Modified Independent             General bed mobility comments: safe technique with cues for sequencing    Transfers Overall transfer level: Modified independent Equipment used: 1 person hand held assist               General transfer comment: safe technique and follows commands well    Ambulation/Gait Ambulation/Gait  assistance: Supervision Gait Distance (Feet): 600 Feet Assistive device: IV Pole Gait Pattern/deviations: Step-through pattern       General Gait Details: ambulated with IV pole and L LE ER. INcreased sway noted with safe technique, no LOB noted  Stairs Stairs: Yes Stairs assistance: Supervision Stair Management: Two rails, Step to pattern, Forwards Number of Stairs: 4 General stair comments: up/down with safe technique. No LOB or pain.  Wheelchair Mobility    Modified Rankin (Stroke Patients Only)       Balance Overall balance assessment: Modified Independent                                           Pertinent Vitals/Pain Pain Assessment Pain Assessment: No/denies pain    Home Living Family/patient expects to be discharged to:: Private residence Living Arrangements: Alone Available Help at Discharge: Family;Available PRN/intermittently (has 2 sons available locally if needed) Type of Home: House Home Access: Stairs to enter Entrance Stairs-Rails: Can reach both Entrance Stairs-Number of Steps: 5   Home Layout: One level;Laundry or work area in basement (does sewing in basement) Home Equipment: Kasandra Knudsen - single point;BSC/3in1 Additional Comments: son is Merchant navy officer    Prior Function Prior Level of Function : Independent/Modified Independent;History of Falls (last six months)             Mobility Comments: active and enjoys walking       Hand Dominance        Extremity/Trunk Assessment   Upper  Extremity Assessment Upper Extremity Assessment: Overall WFL for tasks assessed    Lower Extremity Assessment Lower Extremity Assessment: Overall WFL for tasks assessed       Communication   Communication: No difficulties  Cognition Arousal/Alertness: Awake/alert Behavior During Therapy: WFL for tasks assessed/performed Overall Cognitive Status: Within Functional Limits for tasks assessed                                  General Comments: does report nausea with mobility        General Comments      Exercises     Assessment/Plan    PT Assessment All further PT needs can be met in the next venue of care  PT Problem List Decreased balance;Decreased mobility       PT Treatment Interventions      PT Goals (Current goals can be found in the Care Plan section)  Acute Rehab PT Goals Patient Stated Goal: to go home PT Goal Formulation: All assessment and education complete, DC therapy Time For Goal Achievement: 12/11/21 Potential to Achieve Goals: Good    Frequency       Co-evaluation               AM-PAC PT "6 Clicks" Mobility  Outcome Measure Help needed turning from your back to your side while in a flat bed without using bedrails?: None Help needed moving from lying on your back to sitting on the side of a flat bed without using bedrails?: None Help needed moving to and from a bed to a chair (including a wheelchair)?: None Help needed standing up from a chair using your arms (e.g., wheelchair or bedside chair)?: None Help needed to walk in hospital room?: None Help needed climbing 3-5 steps with a railing? : None 6 Click Score: 24    End of Session Equipment Utilized During Treatment: Gait belt Activity Tolerance: Patient tolerated treatment well Patient left: in bed Nurse Communication: Mobility status PT Visit Diagnosis: Difficulty in walking, not elsewhere classified (R26.2)    Time: 4827-0786 PT Time Calculation (min) (ACUTE ONLY): 27 min   Charges:   PT Evaluation $PT Eval Low Complexity: 1 Low PT Treatments $Gait Training: 8-22 mins        Greggory Stallion, PT, DPT, GCS [phone]   Maela Takeda 12/11/2021, 11:39 AM

## 2021-12-11 NOTE — Plan of Care (Signed)

## 2021-12-11 NOTE — Evaluation (Signed)
Occupational Therapy Evaluation Patient Details Name: Jane Willis MRN: [id number] DOB: 12/21/1940 Today's Date: 12/11/2021   History of Present Illness Pt admitted for elective lumbar fusion of L4-L5. POD 1 at time of evaluation.   Clinical Impression   Pt seen for OT evaluation this date, POD#1 from lumbar fusion. Prior to hospital admission, pt was independent with mobility, ADL, and IADL. 1 fall in past 6 months. Pt lives by herself and has family to live nearby who can assist PRN. Pt currently modified independent with mobility with supervision + PRN VC for techniques to maintain back precautions, and PRN MIN A For LB ADL tasks to maintain precautions. Pt educated in back precautions with handout provided, self care skills, bed mobility and functional transfer training, AE/DME for bathing, dressing, and toileting needs, and home/routines modifications and falls prevention strategies to maximize safety and functional independence while minimizing falls risk and maintaining precautions. Pt verbalized understanding of all education/training provided. Handout provided to support recall and carry over of learned precautions/techniques for bed mobility, functional transfers, and self care skills. Pt will benefit from skilled OT services while hospitalized to maximize safety and carryover of learned instruction for ADL/mobility. Upon hospital discharge, pt safe to discharge home.    Recommendations for follow up therapy are one component of a multi-disciplinary discharge planning process, led by the attending physician.  Recommendations may be updated based on patient status, additional functional criteria and insurance authorization.   Follow Up Recommendations  No OT follow up    Assistance Recommended at Discharge Intermittent Supervision/Assistance  Patient can return home with the following A little help with bathing/dressing/bathroom;Assistance with cooking/housework;Assist for  transportation    Functional Status Assessment  Patient has had a recent decline in their functional status and demonstrates the ability to make significant improvements in function in a reasonable and predictable amount of time.  Equipment Recommendations  Other (comment);Tub/shower seat (LH sponge)    Recommendations for Other Services       Precautions / Restrictions Precautions Precautions: Fall;Back Precaution Booklet Issued: Yes (comment) Precaution Comments: no bending, lifting, twisting, arching Required Braces or Orthoses:  (none) Restrictions Weight Bearing Restrictions: No      Mobility Bed Mobility Overal bed mobility: Modified Independent             General bed mobility comments: using log roll    Transfers Overall transfer level: Modified independent Equipment used: None, Rolling walker (2 wheels)                      Balance Overall balance assessment: Modified Independent                                         ADL either performed or assessed with clinical judgement   ADL Overall ADL's : Needs assistance/impaired                                       General ADL Comments: Pt currently requires PRN MIN A for LB ADL tasks in order to maintain back precautions. Mod indep - supv for ADL mobility.     Vision         Perception     Praxis      Pertinent Vitals/Pain Pain Assessment Pain Assessment: No/denies pain  Hand Dominance Right   Extremity/Trunk Assessment Upper Extremity Assessment Upper Extremity Assessment: Overall WFL for tasks assessed   Lower Extremity Assessment Lower Extremity Assessment: Overall WFL for tasks assessed   Cervical / Trunk Assessment Cervical / Trunk Assessment: Back Surgery   Communication Communication Communication: No difficulties   Cognition Arousal/Alertness: Awake/alert Behavior During Therapy: WFL for tasks assessed/performed Overall Cognitive  Status: Within Functional Limits for tasks assessed                                       General Comments       Exercises Other Exercises Other Exercises: Pt instructed in back precautions and how to maintain during ADL/mobility, AE/DME for ADL, falls prevention, and home/routines modifications. Handout provided   Shoulder Instructions      Home Living Family/patient expects to be discharged to:: Private residence Living Arrangements: Alone Available Help at Discharge: Family;Available PRN/intermittently (has 2 sons available locally if needed) Type of Home: House Home Access: Stairs to enter [employer] of Steps: 5 Entrance Stairs-Rails: Can reach both Home Layout: One level;Laundry or work area in basement (does sewing in basement)     Bathroom Shower/Tub: Teacher, early years/pre: Handicapped height     Home Equipment: Radio producer - single point;BSC/3in1   Additional Comments: son is Merchant navy officer      Prior Functioning/Environment Prior Level of Function : Independent/Modified Independent;History of Falls (last six months);Driving             Mobility Comments: active and enjoys walking ADLs Comments: independent, drives        OT Problem List: Decreased knowledge of precautions;Decreased knowledge of use of DME or AE      OT Treatment/Interventions: Self-care/ADL training;Therapeutic exercise;Therapeutic activities;DME and/or AE instruction;Patient/family education    OT Goals(Current goals can be found in the care plan section) Acute Rehab OT Goals Patient Stated Goal: go home and recover OT Goal Formulation: With patient Time For Goal Achievement: 12/25/21 Potential to Achieve Goals: Good ADL Goals Pt Will Perform Lower Body Dressing: with modified independence;sit to/from stand Pt Will Transfer to Toilet: with modified independence;ambulating (LRAD PRN, maintaining precautions) Pt Will Perform Toileting - Clothing  Manipulation and hygiene: with modified independence Additional ADL Goal #1: Pt will verbalize 100% of back precautions and how to maintain during ADL tasks during recovery.  OT Frequency: Min 2X/week    Co-evaluation              AM-PAC OT "6 Clicks" Daily Activity     Outcome Measure Help from another person eating meals?: None Help from another person taking care of personal grooming?: None Help from another person toileting, which includes using toliet, bedpan, or urinal?: A Little Help from another person bathing (including washing, rinsing, drying)?: A Little Help from another person to put on and taking off regular upper body clothing?: None Help from another person to put on and taking off regular lower body clothing?: A Little 6 Click Score: 21   End of Session    Activity Tolerance: Patient tolerated treatment well Patient left: in chair;with call bell/phone within reach  OT Visit Diagnosis: Other abnormalities of gait and mobility (R26.89)                Time: 5170-0174 OT Time Calculation (min): 30 min Charges:  OT General Charges $OT Visit: 1 Visit OT Evaluation $OT Eval Low Complexity:  1 Low OT Treatments $Self Care/Home Management : 23-37 mins  Ardeth Perfect., MPH, MS, OTR/L ascom [phone] 12/11/21, 11:49 AM

## 2021-12-11 NOTE — Discharge Instructions (Addendum)
NEUROSURGERY DISCHARGE INSTRUCTIONS  Admission diagnosis: S/P lumbar fusion [Z98.1]  Operative procedure: L4-5 XLIF, L4-5 PSF  What to do after you leave the hospital:  Recommended diet: regular diet. Increase protein intake to promote wound healing.  Recommended activity: no lifting, driving, or strenuous exercise for 4 weeks . You should walk multiple times per day  Special Instructions  No straining, no heavy lifting > 10lbs x 4 weeks.  Keep incision area clean and dry. May shower in 2 days. No baths or pools for 6 weeks.  Please remove dressing tomorrow, no need to apply a bandage afterwards  You have no sutures to remove, the skin is closed with adhesive  Please take pain medications as directed. Take a stool softener if on pain medications   Please Report any of the following: Nausea or Vomiting, Temperature is greater than 101.42F (38.1C) degrees, Dizziness, Abdominal Pain, Difficulty Breathing or Shortness of Breath, Inability to Eat, drink Fluids, or Take medications, Bleeding, swelling, or drainage from surgical incision sites, New numbness or weakness, and Bowel or bladder dysfunction to the neurosurgeon on call at [phone]  Additional Follow up appointments Please follow up with Cooper Render PA-C in [facility] as scheduled in 2-3 weeks   Please see below for scheduled appointments:  Future Appointments  Date Time Provider [place]  05/29/2022  9:00 AM Leone Haven, MD [facility]

## 2021-12-12 MED ORDER — SENNA 8.6 MG PO TABS: 1.0000 | ORAL_TABLET | Freq: Every day | ORAL | 0 refills | Status: DC | PRN

## 2021-12-12 MED ORDER — CELECOXIB 200 MG PO CAPS: 200.0000 mg | ORAL_CAPSULE | Freq: Two times a day (BID) | ORAL | 0 refills | Status: DC

## 2021-12-12 MED ORDER — OXYCODONE HCL 5 MG PO TABS: 5.0000 mg | ORAL_TABLET | ORAL | 0 refills | Status: DC | PRN

## 2021-12-12 MED ORDER — TIZANIDINE HCL 4 MG PO TABS: 4.0000 mg | ORAL_TABLET | Freq: Three times a day (TID) | ORAL | 0 refills | Status: DC | PRN

## 2021-12-12 NOTE — Care Management Important Message (Signed)
Important Message  Patient Details  Name: EMIAH PELLICANO MRN: [id number] Date of Birth: 1941-08-30   Medicare Important Message Given:  N/A - LOS <3 / Initial given by admissions     Juliann Pulse A Trixie Maclaren 12/12/2021, 9:22 AM

## 2021-12-12 NOTE — Progress Notes (Signed)
Occupational Therapy Treatment Patient Details Name: Jane Willis MRN: [id number] DOB: 04-03-1941 Today's Date: 12/12/2021   History of present illness Pt admitted for elective lumbar fusion of L4-L5. POD 1 at time of evaluation.   OT comments  Pt seen for OT tx with family member present for majority of session. Pt/family instructed in review of back precautions and how to maintain during ADL/mobility, AE/DME for ADL, falls prevention, bed mobility, and home/routines modifications to maximize recall and carryover. Pt able to return demo safe use of reacher for LB dressing without assist and while maintaining back precautions. Pt/family also instructed in car transfers. Pt/family verbalized understanding of all education and training provided. No additional skilled OT needs at this time. Will discharge from OT services in house.    Recommendations for follow up therapy are one component of a multi-disciplinary discharge planning process, led by the attending physician.  Recommendations may be updated based on patient status, additional functional criteria and insurance authorization.    Follow Up Recommendations  No OT follow up    Assistance Recommended at Discharge Intermittent Supervision/Assistance  Patient can return home with the following  A little help with bathing/dressing/bathroom;Assistance with cooking/housework;Assist for transportation   Equipment Recommendations  Other (comment);Tub/shower seat (LH sponge)    Recommendations for Other Services      Precautions / Restrictions Precautions Precautions: Fall;Back Precaution Booklet Issued: Yes (comment) Precaution Comments: no bending, lifting, twisting, arching Restrictions Weight Bearing Restrictions: No       Mobility Bed Mobility               General bed mobility comments: NT up in recliner    Transfers Overall transfer level: Modified independent Equipment used: None                      Balance Overall balance assessment: Modified Independent                                         ADL either performed or assessed with clinical judgement   ADL Overall ADL's : Needs assistance/impaired                     Lower Body Dressing: Sit to/from stand;Adhering to back precautions;Set up;Supervision/safety;With adaptive equipment Lower Body Dressing Details (indicate cue type and reason): Pt/family instructed in reacher use for LB dressing with pt able to return demo proper technique while maintaining back precautions                    Extremity/Trunk Assessment              Vision       Perception     Praxis      Cognition Arousal/Alertness: Awake/alert Behavior During Therapy: WFL for tasks assessed/performed Overall Cognitive Status: Within Functional Limits for tasks assessed                                          Exercises Other Exercises Other Exercises: Pt/family instructed in review of back precautions and how to maintain during ADL/mobility, AE/DME for ADL, falls prevention, bed mobility, and home/routines modifications to maximize recall and carryover. Other Exercises: Pt/family instructed in car transfers    Shoulder Instructions  General Comments      Pertinent Vitals/ Pain       Pain Assessment Pain Assessment: No/denies pain  Home Living                                          Prior Functioning/Environment              Frequency           Progress Toward Goals  OT Goals(current goals can now be found in the care plan section)  Progress towards OT goals: Goals met/education completed, patient discharged from OT  Acute Rehab OT Goals Patient Stated Goal: go home and recover OT Goal Formulation: With patient Time For Goal Achievement: 12/25/21 Potential to Achieve Goals: Good  Plan All goals met and education completed, patient discharged from OT  services    Co-evaluation                 AM-PAC OT "6 Clicks" Daily Activity     Outcome Measure   Help from another person eating meals?: None Help from another person taking care of personal grooming?: None Help from another person toileting, which includes using toliet, bedpan, or urinal?: None Help from another person bathing (including washing, rinsing, drying)?: A Little Help from another person to put on and taking off regular upper body clothing?: None Help from another person to put on and taking off regular lower body clothing?: None 6 Click Score: 23    End of Session    OT Visit Diagnosis: Other abnormalities of gait and mobility (R26.89)   Activity Tolerance Patient tolerated treatment well   Patient Left in chair;with call bell/phone within reach;with family/visitor present   Nurse Communication          Time: 9470-7615 OT Time Calculation (min): 25 min  Charges: OT General Charges $OT Visit: 1 Visit OT Treatments $Self Care/Home Management : 23-37 mins  Ardeth Perfect., MPH, MS, OTR/L ascom [phone] 12/12/21, 11:23 AM

## 2021-12-12 NOTE — TOC Progression Note (Signed)
Transition of Care [facility]) - Progression Note    Patient Details  Name: LARIAH FLEER MRN: [id number] Date of Birth: January 19, 1941  Transition of Care [facility]) CM/SW [place], RN Phone Number: 12/12/2021, 10:46 AM  Clinical Narrative:     Transition of Care [facility]) Screening Note   Patient Details  Name: BROOKS STOTZ Date of Birth: Mar 13, 1941   Transition of Care [facility]) CM/SW Contact:    Conception Oms, RN Phone Number: 12/12/2021, 10:46 AM    Transition of Care Department [facility]) has reviewed patient and no TOC needs have been identified at this time. We will continue to monitor patient advancement through interdisciplinary progression rounds. If new patient transition needs arise, please place a TOC consult.          Expected Discharge Plan and Services           Expected Discharge Date: 12/11/21                                     Social Determinants of Health (SDOH) Interventions    Readmission Risk Interventions No flowsheet data found.

## 2021-12-12 NOTE — Progress Notes (Signed)
° °   Attending Progress Note  History: Jane Willis is s/p L4-5 XLIF. L4-5 PSF  POD2: reports improved appetite this morning.  Continues to tolerate postoperative pain with oral medications. POD1: NAEO. Reports good pain control. Was ambulating to the bedside commode this morning.  Physical Exam: Vitals:   12/11/21 2043 12/12/21 0459  BP: (!) 150/72 (!) 169/72  Pulse: 73 67  Resp: 16 16  Temp: 97.9 F (36.6 C) 98.4 F (36.9 C)  SpO2: 97% 97%    AA Ox3 CNI Strength:5/5 throughout BLE  Data:  Recent Labs  Lab 12/10/21 1415  CREATININE 0.98    No results for input(s): AST, ALT, ALKPHOS in the last 168 hours.  Invalid input(s): TBILI   Recent Labs  Lab 12/10/21 1415  WBC 10.2  HGB 11.9*  HCT 36.7  PLT 279    No results for input(s): APTT, INR in the last 168 hours.       Other tests/results: none   Assessment/Plan:  Jane Willis is a 81 year old status post L4-5 XLIF and L4-5 PSF for lumbar spondylolisthesis and neurogenic claudication due to spinal stenosis.  - mobilize - pain control - DVT prophylaxis - PTOT  Cooper Render PA-C Department of Neurosurgery

## 2021-12-16 ENCOUNTER — Encounter: Payer: Medicare Other | Admitting: Physical Therapy

## 2022-02-27 ENCOUNTER — Other Ambulatory Visit: Payer: Self-pay | Admitting: Family Medicine

## 2022-02-27 DIAGNOSIS — I10 Essential (primary) hypertension: Secondary | ICD-10-CM

## 2022-03-05 ENCOUNTER — Ambulatory Visit (INDEPENDENT_AMBULATORY_CARE_PROVIDER_SITE_OTHER): Payer: Medicare Other

## 2022-03-05 VITALS — Ht 63.0 in | Wt 138.0 lb

## 2022-03-05 DIAGNOSIS — Z0000 Encounter for general adult medical examination without abnormal findings: Secondary | ICD-10-CM

## 2022-03-05 NOTE — Patient Instructions (Addendum)
  Jane Willis , Thank you for taking time to come for your Medicare Wellness Visit. I appreciate your ongoing commitment to your health goals. Please review the following plan we discussed and let me know if I can assist you in the future.   These are the goals we discussed:  Goals       Patient Stated     Maintain Healthy Lifestyle (pt-stated)      Stay active Healthy diet         This is a list of the screening recommended for you and due dates:  Health Maintenance  Topic Date Due   COVID-19 Vaccine (4 - Booster for Pfizer series) 03/21/2022*   Zoster (Shingles) Vaccine (2 of 2) 06/05/2022*   Pneumonia Vaccine (1 - PCV) 03/06/2023*   Flu Shot  05/19/2022   Tetanus Vaccine  01/20/2025   DEXA scan (bone density measurement)  Completed   HPV Vaccine  Aged Out  *Topic was postponed. The date shown is not the original due date.

## 2022-03-05 NOTE — Progress Notes (Signed)
Subjective:   Jane Willis is a 81 y.o. female who presents for Medicare Annual (Subsequent) preventive examination.  Review of Systems    No ROS.  Medicare Wellness Virtual Visit.  Visual/audio telehealth visit, UTA vital signs.   See social history for additional risk factors.   Cardiac Risk Factors include: advanced age (>62mn, >>79women);hypertension     Objective:    Today's Vitals   03/05/22 1450  Weight: 138 lb (62.6 kg)  Height: '5\' 3"'$  (1.6 m)   Body mass index is 24.45 kg/m.     03/05/2022    2:59 PM 12/10/2021    2:25 PM 12/10/2021    8:30 AM 12/02/2021   11:19 AM 09/10/2021    9:09 AM 08/08/2021    2:37 PM 08/06/2021    5:41 PM  Advanced Directives  Does Patient Have a Medical Advance Directive? Yes Yes Yes Yes Yes Yes No  Type of AParamedicof [place]re[place]Living will Healthcare Power of [place] will [place] River[place] will  [place]Living will   Does patient want to make changes to medical advance directive? No - Patient declined No - Patient declined   No - Patient declined No - Patient declined   Copy of [place] Chart? No - copy requested     No - copy requested   Would patient like information on creating a medical advance directive?      No - Patient declined No - Patient declined    Current Medications (verified) Outpatient Encounter Medications as of 03/05/2022  Medication Sig   acetaminophen (TYLENOL) 500 MG tablet Take 1,000 mg by mouth daily as needed for moderate pain.   amLODipine (NORVASC) 10 MG tablet TAKE 1 TABLET(10 MG) BY MOUTH DAILY   Ascorbic Acid (VITAMIN C) 1000 MG tablet Take 1,000 mg by mouth daily.   celecoxib (CELEBREX) 200 MG capsule Take 1 capsule (200 mg total) by mouth every 12 (twelve) hours.   Cholecalciferol (VITAMIN D-3) 1000 UNITS CAPS Take 1,000 capsules by mouth 2 (two) times daily.   methylcellulose  oral powder Take by mouth daily.   Multiple Minerals-Vitamins (CITRACAL MAXIMUM PLUS) TABS Take 1 tablet by mouth 2 (two) times daily.   Omega-3 Fatty Acids (FISH OIL) 1200 MG CAPS Take 1,200 mg by mouth daily.   Polyethyl Glycol-Propyl Glycol (SYSTANE OP) Place 1 drop into both eyes daily as needed (dry eyes).   senna (SENOKOT) 8.6 MG TABS tablet Take 1 tablet (8.6 mg total) by mouth daily as needed for mild constipation.   tiZANidine (ZANAFLEX) 4 MG tablet Take 1 tablet (4 mg total) by mouth every 8 (eight) hours as needed for muscle spasms.   vitamin B-12 (CYANOCOBALAMIN) 1000 MCG tablet Take 1,000 mcg by mouth 2 (two) times daily.   [DISCONTINUED] oxyCODONE (OXY IR/ROXICODONE) 5 MG immediate release tablet Take 1 tablet (5 mg total) by mouth every 3 (three) hours as needed for moderate pain ((score 4 to 6)).   No facility-administered encounter medications on file as of 03/05/2022.    Allergies (verified) Gabapentin   History: Past Medical History:  Diagnosis Date   Allergy    Seasonal   Arthritis    Chicken pox    Hyperlipidemia    Hypertension    PONV (postoperative nausea and vomiting)    Urinary incontinence    Past Surgical History:  Procedure Laterality Date   ABDOMINAL HYSTERECTOMY  1986   ANTERIOR  LATERAL LUMBAR FUSION WITH PERCUTANEOUS SCREW 1 LEVEL N/A 12/10/2021   Procedure: L4-5 LATERAL INTERBODY FUSION WITH POSTERIOR PERCUTANEOUS FIXATION;  Surgeon: Meade Maw, MD;  Location: [facility] ORS;  Service: Neurosurgery;  Laterality: N/A;   APPLICATION OF INTRAOPERATIVE CT SCAN N/A 12/10/2021   Procedure: APPLICATION OF INTRAOPERATIVE CT SCAN;  Surgeon: Meade Maw, MD;  Location: [facility] ORS;  Service: Neurosurgery;  Laterality: N/A;   BREAST EXCISIONAL BIOPSY Left 1970's   benign   BREAST SURGERY  1968   CATARACT EXTRACTION  2014/2015   Both eyes   EYE SURGERY     FRACTURE SURGERY  01/2009   Fractured Left ankle, plate on outside of ankle 2 rods through/across  ankle from inside of ankle   OOPHORECTOMY     TONSILLECTOMY AND ADENOIDECTOMY  1960   Family History  Problem Relation Age of Onset   Arthritis Mother    Hypertension Mother    Arthritis Father    Hypertension Father    Arthritis Maternal Grandmother    Cancer Brother        Colon   Cancer Maternal Aunt        Breast Cancer   Breast cancer Maternal Aunt    Breast cancer Cousin    Breast cancer Cousin    Hyperlipidemia Son    Hyperlipidemia Son    Social History   Socioeconomic History   Marital status: Widowed    Spouse name: Not on file   Number of children: Not on file   Years of education: Not on file   Highest education level: Not on file  Occupational History   Not on file  Tobacco Use   Smoking status: Never   Smokeless tobacco: Never  Vaping Use   Vaping Use: Never used  Substance and Sexual Activity   Alcohol use: No    Alcohol/week: 0.0 standard drinks   Drug use: No   Sexual activity: Never    Partners: Male  Other Topics Concern   Not on file  Social History Narrative   Widowed   Retired   Children 3   Pets: none   Caffeine- Coffee, Tea, rare soda   Social Determinants of Radio broadcast assistant Strain: Not on file  Food Insecurity: Not on file  Transportation Needs: Not on file  Physical Activity: Not on file  Stress: Not on file  Social Connections: Not on file    Tobacco Counseling Counseling given: Not Answered   Clinical Intake:  Pre-visit preparation completed: Yes        Diabetes: No  How often do you need to have someone help you when you read instructions, pamphlets, or other written materials from your doctor or pharmacy?: 1 - Never  Interpreter Needed?: No      Activities of Daily Living    03/05/2022    3:04 PM 12/10/2021    2:25 PM  In your present state of health, do you have any difficulty performing the following activities:  Hearing? 0 0  Vision? 0 0  Difficulty concentrating or making decisions? 0  0  Walking or climbing stairs? 0 1  Dressing or bathing? 0 0  Doing errands, shopping? 0 0  Preparing Food and eating ? N   Using the Toilet? N   In the past six months, have you accidently leaked urine? N   Do you have problems with loss of bowel control? N   Managing your Medications? N   Managing your Finances? N   Housekeeping  or managing your Housekeeping? N     Patient Care Team: Leone Haven, MD as PCP - General (Family Medicine)  Indicate any recent Medical Services you may have received from other than [facility] providers in the past year (date may be approximate).     Assessment:   This is a routine wellness examination for [place].  Virtual Visit via Telephone Note  I connected with  Arbutus Leas on 03/05/22 at  2:45 PM EDT by telephone and verified that I am speaking with the correct person using two identifiers.  Persons participating in the virtual visit: patient/Nurse Health Advisor   I discussed the limitations of performing an evaluation and management service by telehealth. We continued and completed visit with audio only. Some vital signs may be absent or patient reported.   Hearing/Vision screen Hearing Screening - Comments:: Patient is able to hear conversational tones without difficulty. No issues reported. Vision Screening - Comments:: Followed by [facility]  Wears corrective lenses when reading Cataract extraction, bilateral They have regular follow up with the ophthalmologist  Dietary issues and exercise activities discussed:   Regular diet Good water intake   Goals Addressed               This Visit's Progress     Patient Stated     Maintain Healthy Lifestyle (pt-stated)        Stay active Healthy diet        Depression Screen    03/05/2022    3:04 PM 11/28/2021    8:25 AM 03/04/2021    2:13 PM 12/24/2020    9:34 AM 06/18/2020    9:10 AM 03/01/2020    9:42 AM 08/29/2019    3:41 PM  PHQ 2/9 Scores  PHQ - 2 Score 0  0 0 0 0 0 0    Fall Risk    03/05/2022    3:03 PM 11/28/2021    8:25 AM 03/04/2021    2:17 PM 12/24/2020    9:34 AM 06/18/2020    9:09 AM  [place] in the past year? 0 0 0 0 0  Number falls in past yr: 0 0 0 0 0  Injury with Fall?  0 0    Risk for fall due to :  No Fall Risks     Follow up Falls evaluation completed Falls evaluation completed Falls evaluation completed Falls evaluation completed Falls evaluation completed    [place]: Home free of loose throw rugs in walkways, pet beds, electrical cords, etc? Yes  Adequate lighting in your home to reduce risk of falls? Yes   ASSISTIVE DEVICES UTILIZED TO PREVENT FALLS: Use of a cane, walker or w/c? No   TIMED UP AND GO: Was the test performed? No .   Cognitive Function: Patient is alert and oriented x3.  Enjoys playing piano, reading and other brain health activities.     02/14/2018    8:58 AM 02/11/2017    9:05 AM  MMSE - Mini Mental State Exam  Orientation to time 5 5  Orientation to Place 5 5  Registration 3 3  Attention/ Calculation 5 5  Recall 3 3  Language- name 2 objects 2 2  Language- repeat 1 1  Language- follow 3 step command 3 3  Language- read & follow direction 1 1  Write a sentence 1 1  Copy design 1 1  Total score 30 30  03/01/2020    2:19 PM 03/01/2019   11:57 AM  6CIT Screen  What Year? 0 points 0 points  What month? 0 points 0 points  What time?  0 points  Count back from 20  0 points  Months in reverse 0 points 0 points  Repeat phrase  0 points  Total Score  0 points    Immunizations Immunization History  Administered Date(s) Administered   Fluad Quad(high Dose 65+) 08/14/2021   Influenza, High Dose Seasonal PF 08/01/2016, 07/16/2017, 07/06/2018, 08/01/2019   PFIZER(Purple Top)SARS-COV-2 Vaccination 11/06/2019, 11/28/2019, 08/09/2020   Pneumococcal-Unspecified 01/25/2012   Tdap 01/21/2015   Zoster Recombinat (Shingrix) 07/26/2020    Pneumococcal vaccine status: Due, Education has been provided regarding the importance of this vaccine. Advised may receive this vaccine at local pharmacy or Health Dept. Aware to provide a copy of the vaccination record if obtained from local pharmacy or Health Dept. Verbalized acceptance and understanding.  Shingrix vaccines- agrees to update immunization record with second vaccine.   Screening Tests Health Maintenance  Topic Date Due   COVID-19 Vaccine (4 - Booster for Pfizer series) 03/21/2022 (Originally 10/04/2020)   Zoster Vaccines- Shingrix (2 of 2) 06/05/2022 (Originally 09/20/2020)   Pneumonia Vaccine 33+ Years old (1 - PCV) 03/06/2023 (Originally 06/13/2004)   INFLUENZA VACCINE  05/19/2022   TETANUS/TDAP  01/20/2025   DEXA SCAN  Completed   HPV VACCINES  Aged Out   Health Maintenance There are no preventive care reminders to display for this patient.  Lung Cancer Screening: (Low Dose CT Chest recommended if Age 64-80 years, 30 pack-year currently smoking OR have quit w/in 15years.) does not qualify.   Vision Screening: Recommended annual ophthalmology exams for early detection of glaucoma and other disorders of the eye.  Dental Screening: Recommended annual dental exams for proper oral hygiene  Community Resource Referral / Chronic Care Management: CRR required this visit?  No   CCM required this visit?  No      Plan:   Keep all routine maintenance appointments.   I have personally reviewed and noted the following in the patient's chart:   Medical and social history Use of alcohol, tobacco or illicit drugs  Current medications and supplements including opioid prescriptions.  Functional ability and status Nutritional status Physical activity Advanced directives List of other physicians Hospitalizations, surgeries, and ER visits in previous 12 months Vitals Screenings to include cognitive, depression, and falls Referrals and appointments  In addition, I  have reviewed and discussed with patient certain preventive protocols, quality metrics, and best practice recommendations. A written personalized care plan for preventive services as well as general preventive health recommendations were provided to patient.     Varney Biles, LPN   [id number]     Subjective:   Jane Willis is a 81 y.o. female who presents for Medicare Annual (Subsequent) preventive examination.  Review of Systems    No ROS.  Medicare Wellness Virtual Visit.  Visual/audio telehealth visit, UTA vital signs.   See social history for additional risk factors.   Cardiac Risk Factors include: advanced age (>45mn, >>68women);hypertension     Objective:    Today's Vitals   03/05/22 1450  Weight: 138 lb (62.6 kg)  Height: '5\' 3"'$  (1.6 m)   Body mass index is 24.45 kg/m.     03/05/2022    2:59 PM 12/10/2021    2:25 PM 12/10/2021    8:30 AM 12/02/2021   11:19 AM 09/10/2021    9:09 AM 08/08/2021  2:37 PM 08/06/2021    5:41 PM  Advanced Directives  Does Patient Have a Medical Advance Directive? Yes Yes Yes Yes Yes Yes No  Type of Paramedic of [place];Living will Healthcare Power of [place];Living will [place];Living will  [place];Living will   Does patient want to make changes to medical advance directive? No - Patient declined No - Patient declined   No - Patient declined No - Patient declined   Copy of [place] in Chart? No - copy requested     No - copy requested   Would patient like information on creating a medical advance directive?      No - Patient declined No - Patient declined    Current Medications (verified) Outpatient Encounter Medications as of 03/05/2022  Medication Sig   acetaminophen (TYLENOL) 500 MG tablet Take 1,000 mg by mouth daily as needed for moderate pain.   amLODipine (NORVASC) 10 MG tablet TAKE 1 TABLET(10 MG)  BY MOUTH DAILY   Ascorbic Acid (VITAMIN C) 1000 MG tablet Take 1,000 mg by mouth daily.   celecoxib (CELEBREX) 200 MG capsule Take 1 capsule (200 mg total) by mouth every 12 (twelve) hours.   Cholecalciferol (VITAMIN D-3) 1000 UNITS CAPS Take 1,000 capsules by mouth 2 (two) times daily.   methylcellulose oral powder Take by mouth daily.   Multiple Minerals-Vitamins (CITRACAL MAXIMUM PLUS) TABS Take 1 tablet by mouth 2 (two) times daily.   Omega-3 Fatty Acids (FISH OIL) 1200 MG CAPS Take 1,200 mg by mouth daily.   Polyethyl Glycol-Propyl Glycol (SYSTANE OP) Place 1 drop into both eyes daily as needed (dry eyes).   senna (SENOKOT) 8.6 MG TABS tablet Take 1 tablet (8.6 mg total) by mouth daily as needed for mild constipation.   tiZANidine (ZANAFLEX) 4 MG tablet Take 1 tablet (4 mg total) by mouth every 8 (eight) hours as needed for muscle spasms.   vitamin B-12 (CYANOCOBALAMIN) 1000 MCG tablet Take 1,000 mcg by mouth 2 (two) times daily.   [DISCONTINUED] oxyCODONE (OXY IR/ROXICODONE) 5 MG immediate release tablet Take 1 tablet (5 mg total) by mouth every 3 (three) hours as needed for moderate pain ((score 4 to 6)).   No facility-administered encounter medications on file as of 03/05/2022.    Allergies (verified) Gabapentin   History: Past Medical History:  Diagnosis Date   Allergy    Seasonal   Arthritis    Chicken pox    Hyperlipidemia    Hypertension    PONV (postoperative nausea and vomiting)    Urinary incontinence    Past Surgical History:  Procedure Laterality Date   ABDOMINAL HYSTERECTOMY  1986   ANTERIOR LATERAL LUMBAR FUSION WITH PERCUTANEOUS SCREW 1 LEVEL N/A 12/10/2021   Procedure: L4-5 LATERAL INTERBODY FUSION WITH POSTERIOR PERCUTANEOUS FIXATION;  Surgeon: Meade Maw, MD;  Location: [facility] ORS;  Service: Neurosurgery;  Laterality: N/A;   APPLICATION OF INTRAOPERATIVE CT SCAN N/A 12/10/2021   Procedure: APPLICATION OF INTRAOPERATIVE CT SCAN;  Surgeon: Meade Maw, MD;  Location: [facility] ORS;  Service: Neurosurgery;  Laterality: N/A;   BREAST EXCISIONAL BIOPSY Left 1970's   benign   BREAST SURGERY  1968   CATARACT EXTRACTION  2014/2015   Both eyes   EYE SURGERY     FRACTURE SURGERY  01/2009   Fractured Left ankle, plate on outside of ankle 2 rods through/across ankle from inside of ankle   OOPHORECTOMY  TONSILLECTOMY AND ADENOIDECTOMY  1960   Family History  Problem Relation Age of Onset   Arthritis Mother    Hypertension Mother    Arthritis Father    Hypertension Father    Arthritis Maternal Grandmother    Cancer Brother        Colon   Cancer Maternal Aunt        Breast Cancer   Breast cancer Maternal Aunt    Breast cancer Cousin    Breast cancer Cousin    Hyperlipidemia Son    Hyperlipidemia Son    Social History   Socioeconomic History   Marital status: Widowed    Spouse name: Not on file   Number of children: Not on file   Years of education: Not on file   Highest education level: Not on file  Occupational History   Not on file  Tobacco Use   Smoking status: Never   Smokeless tobacco: Never  Vaping Use   Vaping Use: Never used  Substance and Sexual Activity   Alcohol use: No    Alcohol/week: 0.0 standard drinks   Drug use: No   Sexual activity: Never    Partners: Male  Other Topics Concern   Not on file  Social History Narrative   Widowed   Retired   Children 3   Pets: none   Caffeine- Coffee, Tea, rare soda   Social Determinants of Radio broadcast assistant Strain: Not on file  Food Insecurity: Not on file  Transportation Needs: Not on file  Physical Activity: Not on file  Stress: Not on file  Social Connections: Not on file    Tobacco Counseling Counseling given: Not Answered   Clinical Intake:  Pre-visit preparation completed: Yes        Diabetes: No  How often do you need to have someone help you when you read instructions, pamphlets, or other written materials from your  doctor or pharmacy?: 1 - Never   Interpreter Needed?: No      Activities of Daily Living    03/05/2022    3:04 PM 12/10/2021    2:25 PM  In your present state of health, do you have any difficulty performing the following activities:  Hearing? 0 0  Vision? 0 0  Difficulty concentrating or making decisions? 0 0  Walking or climbing stairs? 0 1  Dressing or bathing? 0 0  Doing errands, shopping? 0 0  Preparing Food and eating ? N   Using the Toilet? N   In the past six months, have you accidently leaked urine? N   Do you have problems with loss of bowel control? N   Managing your Medications? N   Managing your Finances? N   Housekeeping or managing your Housekeeping? N     Patient Care Team: Leone Haven, MD as PCP - General (Family Medicine)  Indicate any recent Medical Services you may have received from other than [facility] providers in the past year (date may be approximate).     Assessment:   This is a routine wellness examination for [place].  Virtual Visit via Telephone Note  I connected with  Arbutus Leas on 03/05/22 at  2:45 PM EDT by telephone and verified that I am speaking with the correct person using two identifiers.  Persons participating in the virtual visit: patient/Nurse Health Advisor   I discussed the limitations of performing an evaluation and management service by telehealth. We continued and completed visit with audio only.Some vital signs  may be absent or patient reported.   Hearing/Vision screen Hearing Screening - Comments:: Patient is able to hear conversational tones without difficulty. No issues reported. Vision Screening - Comments:: Followed by [facility]  Wears corrective lenses when reading Cataract extraction, bilateral They have regular follow up with the ophthalmologist  Dietary issues and exercise activities discussed:   Regular diet   Goals Addressed               This Visit's Progress     Patient  Stated     Maintain Healthy Lifestyle (pt-stated)        Stay active Healthy diet        Depression Screen    03/05/2022    3:04 PM 11/28/2021    8:25 AM 03/04/2021    2:13 PM 12/24/2020    9:34 AM 06/18/2020    9:10 AM 03/01/2020    9:42 AM 08/29/2019    3:41 PM  PHQ 2/9 Scores  PHQ - 2 Score 0 0 0 0 0 0 0    Fall Risk    03/05/2022    3:03 PM 11/28/2021    8:25 AM 03/04/2021    2:17 PM 12/24/2020    9:34 AM 06/18/2020    9:09 AM  [place] in the past year? 0 0 0 0 0  Number falls in past yr: 0 0 0 0 0  Injury with Fall?  0 0    Risk for fall due to :  No Fall Risks     Follow up Falls evaluation completed Falls evaluation completed Falls evaluation completed Falls evaluation completed Falls evaluation completed    [place]: Home free of loose throw rugs in walkways, pet beds, electrical cords, etc? Yes  Adequate lighting in your home to reduce risk of falls? Yes   ASSISTIVE DEVICES UTILIZED TO PREVENT FALLS: Use of a cane, walker or w/c? No   TIMED UP AND GO: Was the test performed? No .   Cognitive Function: Patient is alert and oriented x3.     02/14/2018    8:58 AM 02/11/2017    9:05 AM  MMSE - Mini Mental State Exam  Orientation to time 5 5  Orientation to Place 5 5  Registration 3 3  Attention/ Calculation 5 5  Recall 3 3  Language- name 2 objects 2 2  Language- repeat 1 1  Language- follow 3 step command 3 3  Language- read & follow direction 1 1  Write a sentence 1 1  Copy design 1 1  Total score 30 30        03/01/2020    2:19 PM 03/01/2019   11:57 AM  6CIT Screen  What Year? 0 points 0 points  What month? 0 points 0 points  What time?  0 points  Count back from 20  0 points  Months in reverse 0 points 0 points  Repeat phrase  0 points  Total Score  0 points    Immunizations Immunization History  Administered Date(s) Administered   Fluad Quad(high Dose 65+) 08/14/2021   Influenza, High Dose  Seasonal PF 08/01/2016, 07/16/2017, 07/06/2018, 08/01/2019   PFIZER(Purple Top)SARS-COV-2 Vaccination 11/06/2019, 11/28/2019, 08/09/2020   Pneumococcal-Unspecified 01/25/2012   Tdap 01/21/2015   Zoster Recombinat (Shingrix) 07/26/2020   Pneumococcal vaccine status: Due, Education has been provided regarding the importance of this vaccine. Advised may receive this vaccine at local pharmacy or Health Dept. Aware to  provide a copy of the vaccination record if obtained from local pharmacy or Health Dept. Verbalized acceptance and understanding.   Shingrix Completed?: No.    Education has been provided regarding the importance of this vaccine. Patient has been advised to call insurance company to determine out of pocket expense if they have not yet received this vaccine. Advised may also receive vaccine at local pharmacy or Health Dept. Verbalized acceptance and understanding.  Screening Tests Health Maintenance  Topic Date Due   COVID-19 Vaccine (4 - Booster for Pfizer series) 03/21/2022 (Originally 10/04/2020)   Zoster Vaccines- Shingrix (2 of 2) 06/05/2022 (Originally 09/20/2020)   Pneumonia Vaccine 63+ Years old (1 - PCV) 03/06/2023 (Originally 06/13/2004)   INFLUENZA VACCINE  05/19/2022   TETANUS/TDAP  01/20/2025   DEXA SCAN  Completed   HPV VACCINES  Aged Out    Health Maintenance  There are no preventive care reminders to display for this patient.  Lung Cancer Screening: (Low Dose CT Chest recommended if Age 67-80 years, 30 pack-year currently smoking OR have quit w/in 15years.) does not qualify.   Hepatitis C Screening: does not qualify.  Vision Screening: Recommended annual ophthalmology exams for early detection of glaucoma and other disorders of the eye.  Dental Screening: Recommended annual dental exams for proper oral hygiene  Community Resource Referral / Chronic Care Management: CRR required this visit?  No   CCM required this visit?  No      Plan:   Keep all  routine maintenance appointments.   I have personally reviewed and noted the following in the patient's chart:   Medical and social history Use of alcohol, tobacco or illicit drugs  Current medications and supplements including opioid prescriptions.  Functional ability and status Nutritional status Physical activity Advanced directives List of other physicians Hospitalizations, surgeries, and ER visits in previous 12 months Vitals Screenings to include cognitive, depression, and falls Referrals and appointments  In addition, I have reviewed and discussed with patient certain preventive protocols, quality metrics, and best practice recommendations. A written personalized care plan for preventive services as well as general preventive health recommendations were provided to patient.     Varney Biles, LPN   04/27/[id number]

## 2022-05-29 ENCOUNTER — Ambulatory Visit: Payer: Medicare Other | Admitting: Family Medicine

## 2022-06-03 ENCOUNTER — Encounter: Payer: Self-pay | Admitting: Family Medicine

## 2022-06-03 ENCOUNTER — Ambulatory Visit (INDEPENDENT_AMBULATORY_CARE_PROVIDER_SITE_OTHER): Payer: Medicare Other | Admitting: Family Medicine

## 2022-06-03 VITALS — BP 130/60 | HR 76 | Temp 98.9°F | Ht 63.0 in | Wt 140.8 lb

## 2022-06-03 DIAGNOSIS — K219 Gastro-esophageal reflux disease without esophagitis: Secondary | ICD-10-CM | POA: Diagnosis not present

## 2022-06-03 DIAGNOSIS — I10 Essential (primary) hypertension: Secondary | ICD-10-CM

## 2022-06-03 MED ORDER — ESOMEPRAZOLE MAGNESIUM 20 MG PO CPDR: 20.0000 mg | DELAYED_RELEASE_CAPSULE | Freq: Every day | ORAL | 0 refills | Status: DC

## 2022-06-03 NOTE — Assessment & Plan Note (Signed)
Well-controlled.  She will continue amlodipine 10 mg once daily.

## 2022-06-03 NOTE — Patient Instructions (Signed)
Nice to see you. We will try treating with Nexium for 14 days.  Take this 30 to 60 minutes before breakfast.  If your reflux symptoms recur after coming off of this please let us know.

## 2022-06-03 NOTE — Assessment & Plan Note (Addendum)
Ongoing issue over the last year or so.  Discussed dietary changes with reduction in coffee intake.  Discussed types of foods that can trigger reflux.  We will treat with Nexium for 14 days and she will take this 30 to 60 minutes prior to breakfast.  If she has persistent reflux after finishing that course of medication she will let us know and I will refer her to GI for consideration of endoscopy given her age and new onset reflux symptoms.

## 2022-06-03 NOTE — Progress Notes (Signed)
[name], MD Phone: [phone]  Jane Willis is a 81 y.o. female who presents today for f/u.  HYPERTENSION Disease Monitoring Home BP Monitoring not checking  Chest pain- no    Dyspnea- no Medications Compliance-  taking amlodipine. BMET    Component Value Date/Time   NA 136 12/02/2021 1151   K 3.5 12/02/2021 1151   CL 102 12/02/2021 1151   CO2 25 12/02/2021 1151   GLUCOSE 96 12/02/2021 1151   BUN 18 12/02/2021 1151   CREATININE 0.98 12/10/2021 1415   CALCIUM 9.2 12/02/2021 1151   GFRNONAA 58 (L) 12/10/2021 1415   GERD:   Reflux symptoms: yes, multiple days a week, has been going on over the past year, tea makes it worse, though sometimes she will have it with just water   Abd pain: no   Blood in stool: no  Dysphagia: no   EGD: no  Medication: no   Social History   Tobacco Use  Smoking Status Never  Smokeless Tobacco Never    Current Outpatient Medications on File Prior to Visit  Medication Sig Dispense Refill   acetaminophen (TYLENOL) 500 MG tablet Take 1,000 mg by mouth daily as needed for moderate pain.     amLODipine (NORVASC) 10 MG tablet TAKE 1 TABLET(10 MG) BY MOUTH DAILY 90 tablet 1   Ascorbic Acid (VITAMIN C) 1000 MG tablet Take 1,000 mg by mouth daily.     celecoxib (CELEBREX) 200 MG capsule Take 1 capsule (200 mg total) by mouth every 12 (twelve) hours. 60 capsule 0   Cholecalciferol (VITAMIN D-3) 1000 UNITS CAPS Take 1,000 capsules by mouth 2 (two) times daily.     methylcellulose oral powder Take by mouth daily.     Multiple Minerals-Vitamins (CITRACAL MAXIMUM PLUS) TABS Take 1 tablet by mouth 2 (two) times daily.     Omega-3 Fatty Acids (FISH OIL) 1200 MG CAPS Take 1,200 mg by mouth daily.     Polyethyl Glycol-Propyl Glycol (SYSTANE OP) Place 1 drop into both eyes daily as needed (dry eyes).     senna (SENOKOT) 8.6 MG TABS tablet Take 1 tablet (8.6 mg total) by mouth daily as needed for mild constipation. 30 tablet 0   tiZANidine  (ZANAFLEX) 4 MG tablet Take 1 tablet (4 mg total) by mouth every 8 (eight) hours as needed for muscle spasms. 90 tablet 0   vitamin B-12 (CYANOCOBALAMIN) 1000 MCG tablet Take 1,000 mcg by mouth 2 (two) times daily.     No current facility-administered medications on file prior to visit.     ROS see history of present illness  Objective  Physical Exam Vitals:   06/03/22 1548  BP: 130/60  Pulse: 76  Temp: 98.9 F (37.2 C)  SpO2: 98%    BP Readings from Last 3 Encounters:  06/03/22 130/60  12/12/21 (!) 178/79  12/02/21 (!) 166/80   Wt Readings from Last 3 Encounters:  06/03/22 140 lb 12.8 oz (63.9 kg)  03/05/22 138 lb (62.6 kg)  12/10/21 138 lb (62.6 kg)    Physical Exam Constitutional:      General: She is not in acute distress.    Appearance: She is not diaphoretic.  Cardiovascular:     Rate and Rhythm: Normal rate and regular rhythm.     Heart sounds: Normal heart sounds.  Pulmonary:     Effort: Pulmonary effort is normal.     Breath sounds: Normal breath sounds.  Abdominal:     General: Bowel sounds are normal. There  is no distension.     Palpations: Abdomen is soft.     Tenderness: There is no abdominal tenderness.  Skin:    General: Skin is warm and dry.  Neurological:     Mental Status: She is alert.      Assessment/Plan: Please see individual problem list.  Problem List Items Addressed This Visit     GERD (gastroesophageal reflux disease) (Chronic)    Ongoing issue over the last year or so.  Discussed dietary changes with reduction in coffee intake.  Discussed types of foods that can trigger reflux.  We will treat with Nexium for 14 days and she will take this 30 to 60 minutes prior to breakfast.  If she has persistent reflux after finishing that course of medication she will let us know and I will refer her to GI for consideration of endoscopy given her age and new onset reflux symptoms.      Relevant Medications   esomeprazole (NEXIUM) 20 MG  capsule   Hypertension - Primary (Chronic)    Well-controlled.  She will continue amlodipine 10 mg once daily.        Return in about 6 months (around 12/04/2022) for Hypertension.   [name], MD [place]

## 2022-09-14 ENCOUNTER — Other Ambulatory Visit: Payer: Self-pay

## 2022-09-14 DIAGNOSIS — Z981 Arthrodesis status: Secondary | ICD-10-CM

## 2022-09-15 ENCOUNTER — Ambulatory Visit
Admission: RE | Admit: 2022-09-15 | Discharge: 2022-09-15 | Disposition: A | Discharge status: Home / Self Care | Payer: Medicare Other | Attending: Neurosurgery | Admitting: Neurosurgery

## 2022-09-15 ENCOUNTER — Encounter: Payer: Self-pay | Admitting: Neurosurgery

## 2022-09-15 ENCOUNTER — Ambulatory Visit
Admission: RE | Admit: 2022-09-15 | Discharge: 2022-09-15 | Disposition: A | Discharge status: Home / Self Care | Payer: Medicare Other | Source: Ambulatory Visit | Attending: Neurosurgery | Admitting: Neurosurgery

## 2022-09-15 ENCOUNTER — Ambulatory Visit: Payer: Medicare Other | Admitting: Neurosurgery

## 2022-09-15 VITALS — BP 152/78 | Ht 63.0 in | Wt 134.0 lb

## 2022-09-15 DIAGNOSIS — Z981 Arthrodesis status: Secondary | ICD-10-CM | POA: Insufficient documentation

## 2022-09-15 DIAGNOSIS — M4316 Spondylolisthesis, lumbar region: Secondary | ICD-10-CM | POA: Diagnosis not present

## 2022-09-15 DIAGNOSIS — M48062 Spinal stenosis, lumbar region with neurogenic claudication: Secondary | ICD-10-CM

## 2022-09-15 DIAGNOSIS — M4310 Spondylolisthesis, site unspecified: Secondary | ICD-10-CM | POA: Diagnosis not present

## 2022-09-15 DIAGNOSIS — Z09 Encounter for follow-up examination after completed treatment for conditions other than malignant neoplasm: Secondary | ICD-10-CM

## 2022-09-15 NOTE — Progress Notes (Signed)
   DOS: 12/10/2021 (L4-5 XLIF/PSF)  HISTORY OF PRESENT ILLNESS: 09/15/2022 [name] is status post L4-5 intervention.  She is doing very well.  She is having some knee pain.  PHYSICAL EXAMINATION:   Vitals:   09/15/22 1322  BP: (!) 152/78   General: Patient is well developed, well nourished, calm, collected, and in no apparent distress.  NEUROLOGICAL:  General: In no acute distress.  Awake, alert, oriented to person, place, and time. Pupils equal round and reactive to light.   Strength:  Side Iliopsoas Quads Hamstring PF DF EHL  R '5 5 5 5 5 5  '$ L '5 5 5 5 5 5   '$ Incision c/d/i   ROS (Neurologic): Negative except as noted above  IMAGING: No complications noted  ASSESSMENT/PLAN:  Jane Willis is doing well after lumbar fusion for L4-5 spondylolisthesis.  She is much improved compared to before surgery.  I am very pleased with her progress.  She is having left knee pain due to osteoarthritis of the left knee.  I encouraged her to talk with her orthopedic provider regarding intervention.  I will see her back on an as-needed basis.  I spent a total of 10 minutes in this patient's care today. This time was spent reviewing pertinent records including imaging studies, obtaining and confirming history, performing a directed evaluation, formulating and discussing my recommendations, and documenting the visit within the medical record.    Meade Maw MD, [facility] Department of Neurosurgery

## 2022-09-20 DIAGNOSIS — M1712 Unilateral primary osteoarthritis, left knee: Principal | ICD-10-CM | POA: Insufficient documentation

## 2022-10-18 ENCOUNTER — Telehealth: Payer: Self-pay | Admitting: Family Medicine

## 2022-10-18 DIAGNOSIS — I10 Essential (primary) hypertension: Secondary | ICD-10-CM

## 2022-11-06 MED ORDER — AMLODIPINE BESYLATE 10 MG PO TABS: ORAL_TABLET | ORAL | 0 refills | Status: DC

## 2022-11-06 NOTE — Telephone Encounter (Signed)
Pt need 30 day refill on amlodipine sent to [facility]

## 2022-11-06 NOTE — Addendum Note (Signed)
Addended by: Jeralyn Bennett A on: 11/06/2022 03:03 PM   Modules accepted: Orders

## 2022-12-04 ENCOUNTER — Ambulatory Visit (INDEPENDENT_AMBULATORY_CARE_PROVIDER_SITE_OTHER): Payer: Medicare Other | Admitting: Family Medicine

## 2022-12-04 ENCOUNTER — Encounter: Payer: Self-pay | Admitting: Family Medicine

## 2022-12-04 ENCOUNTER — Telehealth: Payer: Self-pay

## 2022-12-04 VITALS — BP 146/76 | HR 74 | Temp 97.9°F | Resp 16 | Ht 63.0 in | Wt 139.4 lb

## 2022-12-04 DIAGNOSIS — G8929 Other chronic pain: Secondary | ICD-10-CM | POA: Diagnosis not present

## 2022-12-04 DIAGNOSIS — I10 Essential (primary) hypertension: Secondary | ICD-10-CM | POA: Diagnosis not present

## 2022-12-04 DIAGNOSIS — R519 Headache, unspecified: Secondary | ICD-10-CM

## 2022-12-04 DIAGNOSIS — R7303 Prediabetes: Secondary | ICD-10-CM | POA: Diagnosis not present

## 2022-12-04 DIAGNOSIS — Z23 Encounter for immunization: Secondary | ICD-10-CM | POA: Diagnosis not present

## 2022-12-04 MED ORDER — AMLODIPINE BESYLATE-VALSARTAN 10-160 MG PO TABS: 1.0000 | ORAL_TABLET | Freq: Every day | ORAL | 1 refills | Status: DC

## 2022-12-04 NOTE — Addendum Note (Signed)
Addended by: Leeanne Rio on: 12/04/2022 02:36 PM   Modules accepted: Orders

## 2022-12-04 NOTE — Progress Notes (Signed)
[name], MD Phone: [phone]  Jane Willis is a 82 y.o. female who presents today for f/u.  HYPERTENSION Disease Monitoring Home BP Monitoring 123-171/77-90 Chest pain- no    Dyspnea- no Medications Compliance-  taking amlodipine.  Edema- no BMET    Component Value Date/Time   NA 137 12/04/2022 1434   K 3.9 12/04/2022 1434   CL 102 12/04/2022 1434   CO2 24 12/04/2022 1434   GLUCOSE 90 12/04/2022 1434   BUN 17 12/04/2022 1434   CREATININE 0.80 12/04/2022 1434   CALCIUM 9.4 12/04/2022 1434   GFRNONAA 58 (L) 12/10/2021 1415   Head discomfort: Patient notes this has been going on since 2022 at least.  She had a concussion at that time.  She notes it is not really a headache that was more of just a discomfort in her head.  It does not throb.  There is no numbness or weakness.  She notes a little more light sensitivity since this has been going on though no significant vision changes.  Notes that occurs bilaterally.  Social History   Tobacco Use  Smoking Status Never  Smokeless Tobacco Never    Current Outpatient Medications on File Prior to Visit  Medication Sig Dispense Refill   acetaminophen (TYLENOL) 500 MG tablet Take 1,000 mg by mouth as needed for moderate pain.     Ascorbic Acid (VITAMIN C) 1000 MG tablet Take 1,000 mg by mouth daily.     Cholecalciferol (VITAMIN D-3) 1000 UNITS CAPS Take 1,000 capsules by mouth 2 (two) times daily.     methylcellulose oral powder Take by mouth daily.     Multiple Minerals-Vitamins (CITRACAL MAXIMUM PLUS) TABS Take 1 tablet by mouth 2 (two) times daily.     Polyethyl Glycol-Propyl Glycol (SYSTANE OP) Place 1 drop into both eyes daily as needed (dry eyes).     senna (SENOKOT) 8.6 MG TABS tablet Take 1 tablet (8.6 mg total) by mouth daily as needed for mild constipation. 30 tablet 0   vitamin B-12 (CYANOCOBALAMIN) 1000 MCG tablet Take 1,000 mcg by mouth 2 (two) times daily.     celecoxib (CELEBREX) 200 MG capsule Take 1  capsule (200 mg total) by mouth every 12 (twelve) hours. (Patient not taking: Reported on 12/04/2022) 60 capsule 0   esomeprazole (NEXIUM) 20 MG capsule Take 1 capsule (20 mg total) by mouth daily before breakfast for 14 days. Take 30-60 minutes prior to breakfast. 14 capsule 0   Omega-3 Fatty Acids (FISH OIL) 1200 MG CAPS Take 1,200 mg by mouth daily. (Patient not taking: Reported on 12/04/2022)     tiZANidine (ZANAFLEX) 4 MG tablet Take 1 tablet (4 mg total) by mouth every 8 (eight) hours as needed for muscle spasms. (Patient not taking: Reported on 12/04/2022) 90 tablet 0   No current facility-administered medications on file prior to visit.     ROS see history of present illness  Objective  Physical Exam Vitals:   12/04/22 1346 12/04/22 1421  BP: (!) 148/78 (!) 146/76  Pulse: 74   Resp: 16   Temp: 97.9 F (36.6 C)   SpO2: 98%     BP Readings from Last 3 Encounters:  12/04/22 (!) 146/76  09/15/22 (!) 152/78  06/03/22 130/60   Wt Readings from Last 3 Encounters:  12/04/22 139 lb 6 oz (63.2 kg)  09/15/22 134 lb (60.8 kg)  06/03/22 140 lb 12.8 oz (63.9 kg)    Physical Exam Constitutional:      General: She  is not in acute distress.    Appearance: She is not diaphoretic.  HENT:     Head:     Comments: No tenderness of either temple, no palpable arteries in either temple Cardiovascular:     Rate and Rhythm: Normal rate and regular rhythm.     Heart sounds: Normal heart sounds.  Pulmonary:     Effort: Pulmonary effort is normal.     Breath sounds: Normal breath sounds.  Skin:    General: Skin is warm and dry.  Neurological:     Mental Status: She is alert.     Comments: CN 3-12 intact, 5/5 strength in bilateral biceps, triceps, grip, quads, hamstrings, plantar and dorsiflexion, sensation to light touch intact in bilateral UE and LE, normal gait      Assessment/Plan: Please see individual problem list.  Chronic nonintractable headache, unspecified headache  type Assessment & Plan: Chronic issue.  Possibly related to blood pressure being above goal.  I doubt this is related to her prior concussion.  Unlikely to be related to temporal arteritis given the exam and history.  Will check an ESR to rule out temporal arteritis.  Will try to get her blood pressure down.  If getting her blood pressure down further does not help with this then we may need to consider imaging her head.  Orders: -     Sedimentation rate -     COMPLETE METABOLIC PANEL WITH GFR  Prediabetes Assessment & Plan: Chronic issue.  Check A1c.  Orders: -     Lipid panel -     Hemoglobin A1c  Primary hypertension Assessment & Plan: Chronic issue.  Poorly controlled.  We will add valsartan 160 mg daily.  She will continue amlodipine 10 mg daily.  These will be combined into the single tablet.  She will return in 10 days for labs and see me in 1 month for BP check.  Orders: -     amLODIPine Besylate-Valsartan; Take 1 tablet by mouth daily.  Dispense: 90 tablet; Refill: 1 -     Basic metabolic panel; Future -     Lipid panel -     COMPLETE METABOLIC PANEL WITH GFR  Need for pneumococcal 20-valent conjugate vaccination -     Pneumococcal conjugate vaccine 20-valent -     COMPLETE METABOLIC PANEL WITH GFR     Health Maintenance: prevnar20 given today.   Return in about 10 days (around 12/14/2022) for Labs, 1 month PCP.   [name], MD [place]

## 2022-12-04 NOTE — Assessment & Plan Note (Signed)
Chronic issue.  Poorly controlled.  We will add valsartan 160 mg daily.  She will continue amlodipine 10 mg daily.  These will be combined into the single tablet.  She will return in 10 days for labs and see me in 1 month for BP check.

## 2022-12-04 NOTE — Patient Instructions (Addendum)
Nice to see you. I am going to start you on a new blood pressure medicine that combines amlodipine and valsartan.  Once you start on that you will no longer take the amlodipine only pill. You will return in 10 days for repeat labs.  Will also get some labs today.

## 2022-12-04 NOTE — Assessment & Plan Note (Signed)
Chronic issue.  Check A1c.

## 2022-12-04 NOTE — Telephone Encounter (Signed)
Noted. That is fine.

## 2022-12-04 NOTE — Telephone Encounter (Signed)
At [place], Dr. [name] noted that he would like to see patient back in one month for a follow-up.  I had to go past the one-month timeframe to find an available slot.  Patient is scheduled to see Dr. Caryl Bis on 01/18/2023.

## 2022-12-04 NOTE — Addendum Note (Signed)
Addended by: Neta Ehlers on: 12/04/2022 02:34 PM   Modules accepted: Orders

## 2022-12-04 NOTE — Assessment & Plan Note (Addendum)
Chronic issue.  Possibly related to blood pressure being above goal.  I doubt this is related to her prior concussion.  Unlikely to be related to temporal arteritis given the exam and history.  Will check an ESR to rule out temporal arteritis.  Will try to get her blood pressure down.  If getting her blood pressure down further does not help with this then we may need to consider imaging her head.

## 2022-12-05 LAB — LIPID PANEL
Cholesterol: 267 mg/dL — ABNORMAL HIGH (ref <200)
HDL: 66 mg/dL (ref >=50)
LDL Cholesterol (Calc): 172 mg/dL (calc) — ABNORMAL HIGH
Non-HDL Cholesterol (Calc): 201 mg/dL (calc) — ABNORMAL HIGH (ref <130)
Total CHOL/HDL Ratio: 4 (calc) (ref <5.0)
Triglycerides: 147 mg/dL (ref <150)

## 2022-12-05 LAB — HEMOGLOBIN A1C
Hgb A1c MFr Bld: 5.6 % of total Hgb (ref <5.7)
Mean Plasma Glucose: 114 mg/dL
eAG (mmol/L): 6.3 mmol/L

## 2022-12-05 LAB — COMPLETE METABOLIC PANEL WITH GFR
AG Ratio: 1.4 (calc) (ref 1.0–2.5)
ALT: 10 U/L (ref 6–29)
AST: 14 U/L (ref 10–35)
Albumin: 4.1 g/dL (ref 3.6–5.1)
Alkaline phosphatase (APISO): 70 U/L (ref 37–153)
BUN: 17 mg/dL (ref 7–25)
CO2: 24 mmol/L (ref 20–32)
Calcium: 9.4 mg/dL (ref 8.6–10.4)
Chloride: 102 mmol/L (ref 98–110)
Creat: 0.8 mg/dL (ref 0.60–0.95)
Globulin: 2.9 g/dL (calc) (ref 1.9–3.7)
Glucose, Bld: 90 mg/dL (ref 65–99)
Potassium: 3.9 mmol/L (ref 3.5–5.3)
Sodium: 137 mmol/L (ref 135–146)
Total Bilirubin: 0.4 mg/dL (ref 0.2–1.2)
Total Protein: 7 g/dL (ref 6.1–8.1)
eGFR: 73 mL/min/{1.73_m2} (ref >=60)

## 2022-12-05 LAB — SEDIMENTATION RATE: Sed Rate: 22 mm/h (ref 0–30)

## 2022-12-08 ENCOUNTER — Telehealth: Payer: Self-pay

## 2022-12-08 NOTE — Telephone Encounter (Signed)
Left message to call the office back.

## 2022-12-08 NOTE — Telephone Encounter (Signed)
Patient called and note was read.

## 2022-12-08 NOTE — Telephone Encounter (Signed)
Jane Willis spoke with Patient. Patient understands and is agreeable.

## 2022-12-08 NOTE — Telephone Encounter (Signed)
-----   Message from Leone Haven, MD sent at 12/08/2022 10:56 AM EST ----- Please let the patient know that her inflammatory marker did not reveal a cause for her headaches.  If her headaches change or worsen in any manner she needs to let us know.  Her A1c was slightly improved.  Her cholesterol is elevated though given her age and lack of a stroke or heart attack history we will not proceed with any treatment for her cholesterol at this time.

## 2022-12-14 ENCOUNTER — Other Ambulatory Visit (INDEPENDENT_AMBULATORY_CARE_PROVIDER_SITE_OTHER): Payer: Medicare Other

## 2022-12-14 DIAGNOSIS — I10 Essential (primary) hypertension: Secondary | ICD-10-CM | POA: Diagnosis not present

## 2022-12-14 LAB — BASIC METABOLIC PANEL
BUN: 18 mg/dL (ref 6–23)
CO2: 26 mEq/L (ref 19–32)
Calcium: 9.4 mg/dL (ref 8.4–10.5)
Chloride: 100 mEq/L (ref 96–112)
Creatinine, Ser: 0.88 mg/dL (ref 0.40–1.20)
GFR: 60.74 mL/min (ref >60.00)
Glucose, Bld: 82 mg/dL (ref 70–99)
Potassium: 4.3 mEq/L (ref 3.5–5.1)
Sodium: 135 mEq/L (ref 135–145)

## 2023-01-18 ENCOUNTER — Ambulatory Visit (INDEPENDENT_AMBULATORY_CARE_PROVIDER_SITE_OTHER): Payer: Medicare Other | Admitting: Family Medicine

## 2023-01-18 ENCOUNTER — Encounter: Payer: Self-pay | Admitting: Family Medicine

## 2023-01-18 VITALS — BP 136/84 | HR 63 | Temp 97.9°F | Ht 63.0 in | Wt 138.0 lb

## 2023-01-18 DIAGNOSIS — R200 Anesthesia of skin: Secondary | ICD-10-CM | POA: Diagnosis not present

## 2023-01-18 DIAGNOSIS — E538 Deficiency of other specified B group vitamins: Secondary | ICD-10-CM | POA: Diagnosis not present

## 2023-01-18 DIAGNOSIS — I10 Essential (primary) hypertension: Secondary | ICD-10-CM

## 2023-01-18 DIAGNOSIS — M1712 Unilateral primary osteoarthritis, left knee: Secondary | ICD-10-CM | POA: Diagnosis not present

## 2023-01-18 DIAGNOSIS — M1990 Unspecified osteoarthritis, unspecified site: Secondary | ICD-10-CM | POA: Insufficient documentation

## 2023-01-18 LAB — VITAMIN B12: Vitamin B-12: 1273 pg/mL — ABNORMAL HIGH (ref 211–911)

## 2023-01-18 NOTE — Progress Notes (Signed)
[name], MD Phone: [phone]  Jane Willis is a 82 y.o. female who presents today for f/u.  HYPERTENSION Disease Monitoring Home BP Monitoring 137-170/73-87 Chest pain- no    Dyspnea- no Medications Compliance-  taking valsartan, amlodipine.   Edema- notes no change to chronic edema Patient reports her headaches resolved with improving blood pressure. BMET    Component Value Date/Time   NA 135 12/14/2022 0947   K 4.3 12/14/2022 0947   CL 100 12/14/2022 0947   CO2 26 12/14/2022 0947   GLUCOSE 82 12/14/2022 0947   BUN 18 12/14/2022 0947   CREATININE 0.88 12/14/2022 0947   CREATININE 0.80 12/04/2022 1434   CALCIUM 9.4 12/14/2022 0947   GFRNONAA 58 (L) 12/10/2021 1415   Fingertip numbness: Patient notes this is a chronic issue.  All of her fingertips have some numbness.  Notes it has not moved proximally though it has become more noticeable.  Does not involve her toes.  She tried gabapentin in the past and had stability issues.  Left knee osteoarthritis: Patient notes she was scheduled to have a knee replacement in the past though canceled it as she was concerned about having that done at her age.  She wonders about having this done at this time.  Social History   Tobacco Use  Smoking Status Never  Smokeless Tobacco Never    Current Outpatient Medications on File Prior to Visit  Medication Sig Dispense Refill   acetaminophen (TYLENOL) 500 MG tablet Take 1,000 mg by mouth as needed for moderate pain.     amLODipine-valsartan (EXFORGE) 10-160 MG tablet Take 1 tablet by mouth daily. 90 tablet 1   Ascorbic Acid (VITAMIN C) 1000 MG tablet Take 1,000 mg by mouth daily.     Cholecalciferol (VITAMIN D-3) 1000 UNITS CAPS Take 1,000 capsules by mouth 2 (two) times daily.     Polyethyl Glycol-Propyl Glycol (SYSTANE OP) Place 1 drop into both eyes daily as needed (dry eyes).     senna (SENOKOT) 8.6 MG TABS tablet Take 1 tablet (8.6 mg total) by mouth daily as needed for  mild constipation. 30 tablet 0   vitamin B-12 (CYANOCOBALAMIN) 1000 MCG tablet Take 1,000 mcg by mouth 2 (two) times daily.     No current facility-administered medications on file prior to visit.     ROS see history of present illness  Objective  Physical Exam Vitals:   01/18/23 1207 01/18/23 1226  BP: (!) 140/86 136/84  Pulse: 63   Temp: 97.9 F (36.6 C)   SpO2: 99%     BP Readings from Last 3 Encounters:  01/18/23 136/84  12/04/22 (!) 146/76  09/15/22 (!) 152/78   Wt Readings from Last 3 Encounters:  01/18/23 138 lb (62.6 kg)  12/04/22 139 lb 6 oz (63.2 kg)  09/15/22 134 lb (60.8 kg)    Physical Exam Constitutional:      General: She is not in acute distress.    Appearance: She is not diaphoretic.  Cardiovascular:     Rate and Rhythm: Normal rate and regular rhythm.     Heart sounds: Normal heart sounds.  Pulmonary:     Effort: Pulmonary effort is normal.     Breath sounds: Normal breath sounds.  Musculoskeletal:     Comments: Slight nonpitting edema noted bilateral lower extremities  Skin:    General: Skin is warm and dry.  Neurological:     Mental Status: She is alert.      Assessment/Plan: Please see individual problem  list.  Primary hypertension Assessment & Plan: Chronic issue.  Still above goal.  Discussed increasing the valsartan though the patient is hesitant to do this.  Discussed potential for lightheadedness and kidney dysfunction and hyperkalemia with increasing the dose on the valsartan.  Patient would like to stick with amlodipine-valsartan 10-160 mg once daily.  We will see what her blood pressure is doing in 3 months.   Bilateral finger numbness Assessment & Plan: Chronic ongoing issue.  Slightly worsened.  She does take B12.  Will check a B12 level today.  Recent A1c was in the normal range.  If her B12 is normal I did discuss that we could refer to neurology or consider trying Lyrica given that she could not tolerate gabapentin in the  past.   Primary osteoarthritis of left knee Assessment & Plan: I encouraged the patient to contact her surgeon to set up a visit to discuss possible knee replacement.  Discussed at this time she would be at low risk from a medical and cardiac perspective.   B12 deficiency -     Vitamin B12    Return in about 3 months (around 04/19/2023) for Hypertension.   [name], MD [place]

## 2023-01-18 NOTE — Assessment & Plan Note (Signed)
Chronic ongoing issue.  Slightly worsened.  She does take B12.  Will check a B12 level today.  Recent A1c was in the normal range.  If her B12 is normal I did discuss that we could refer to neurology or consider trying Lyrica given that she could not tolerate gabapentin in the past.

## 2023-01-18 NOTE — Assessment & Plan Note (Signed)
Chronic issue.  Still above goal.  Discussed increasing the valsartan though the patient is hesitant to do this.  Discussed potential for lightheadedness and kidney dysfunction and hyperkalemia with increasing the dose on the valsartan.  Patient would like to stick with amlodipine-valsartan 10-160 mg once daily.  We will see what her blood pressure is doing in 3 months.

## 2023-01-18 NOTE — Assessment & Plan Note (Signed)
I encouraged the patient to contact her surgeon to set up a visit to discuss possible knee replacement.  Discussed at this time she would be at low risk from a medical and cardiac perspective.

## 2023-01-20 ENCOUNTER — Telehealth: Payer: Self-pay

## 2023-01-20 NOTE — Telephone Encounter (Signed)
LMTCB regarding Dr. Sonnenberg's message 

## 2023-01-20 NOTE — Telephone Encounter (Signed)
Patient states she is returning call from Jeralyn Bennett, [place].  I read Dr. Georges Mouse message to patient.  Patient states she does not want to try Lyrica.  Patient states she is not interested in a referral to Neurology.  Patient states she would like to wait until she sees Dr. Caryl Bis again.  Patient states she will continue to take her B12 orally.

## 2023-01-20 NOTE — Telephone Encounter (Signed)
-----   Message from Leone Haven, MD sent at 01/18/2023  4:56 PM EDT ----- Please let the patient know her B12 is actually slightly elevated.  She can continue with her oral B12 supplement.  We can refer her to neurology if she would like for the burning tingling sensation in her fingertips or we could try Lyrica.  If she does want to try the Lyrica she needs to monitor for drowsiness and balance issues and if those occur she needs to stop the Lyrica.

## 2023-01-21 NOTE — Telephone Encounter (Signed)
Noted  

## 2023-02-25 ENCOUNTER — Telehealth: Payer: Self-pay | Admitting: Family Medicine

## 2023-02-25 NOTE — Telephone Encounter (Signed)
Contacted Jane Willis to schedule their annual wellness visit. Appointment made for 03/11/2023.  Verlee Rossetti; Care Guide Ambulatory Clinical Support [place] [facility] Direct Dial: [phone]

## 2023-03-11 ENCOUNTER — Ambulatory Visit (INDEPENDENT_AMBULATORY_CARE_PROVIDER_SITE_OTHER): Payer: Medicare Other | Admitting: *Deleted

## 2023-03-11 DIAGNOSIS — Z0000 Encounter for general adult medical examination without abnormal findings: Secondary | ICD-10-CM | POA: Diagnosis not present

## 2023-03-11 NOTE — Progress Notes (Signed)
Subjective:   Jane Willis is a 82 y.o. female who presents for Medicare Annual (Subsequent) preventive examination.  I connected with  [name] on 03/11/23 by a telephone enabled telemedicine application and verified that I am speaking with the correct person using two identifiers.   I discussed the limitations of evaluation and management by telemedicine. The patient expressed understanding and agreed to proceed.  Patient location: home  Provider location: telephone/home    Review of Systems     Cardiac Risk Factors include: hypertension;advanced age (>27men, >36 women)     Objective:    Today's Vitals   There is no height or weight on file to calculate BMI.     03/11/2023    8:39 AM 03/05/2022    2:59 PM 12/10/2021    2:25 PM 12/10/2021    8:30 AM 12/02/2021   11:19 AM 09/10/2021    9:09 AM 08/08/2021    2:37 PM  Advanced Directives  Does Patient Have a Medical Advance Directive? Yes Yes Yes Yes Yes Yes Yes  Type of Estate agent of [employer] Power of [place];Living will Healthcare Power of [employer] of [place];Living will Healthcare Power of [place];Living will  Healthcare Power of [place];Living will  Does patient want to make changes to medical advance directive?  No - Patient declined No - Patient declined   No - Patient declined No - Patient declined  Copy of Healthcare Power of Attorney in Chart? Yes - validated most recent copy scanned in chart (See row information) No - copy requested     No - copy requested  Would patient like information on creating a medical advance directive?       No - Patient declined    Current Medications (verified) Outpatient Encounter Medications as of 03/11/2023  Medication Sig   acetaminophen (TYLENOL) 500 MG tablet Take 1,000 mg by mouth as needed for moderate pain.   amLODipine-valsartan (EXFORGE) 10-160 MG tablet Take 1 tablet by mouth daily.   Ascorbic Acid (VITAMIN  C) 1000 MG tablet Take 1,000 mg by mouth daily.   Cholecalciferol (VITAMIN D-3) 1000 UNITS CAPS Take 1,000 capsules by mouth 2 (two) times daily.   Polyethyl Glycol-Propyl Glycol (SYSTANE OP) Place 1 drop into both eyes daily as needed (dry eyes).   senna (SENOKOT) 8.6 MG TABS tablet Take 1 tablet (8.6 mg total) by mouth daily as needed for mild constipation.   vitamin B-12 (CYANOCOBALAMIN) 1000 MCG tablet Take 1,000 mcg by mouth 2 (two) times daily.   No facility-administered encounter medications on file as of 03/11/2023.    Allergies (verified) Gabapentin   History: Past Medical History:  Diagnosis Date   Allergy    Seasonal   Arthritis    Chicken pox    Hyperlipidemia    Hypertension    PONV (postoperative nausea and vomiting)    Urinary incontinence    Past Surgical History:  Procedure Laterality Date   ABDOMINAL HYSTERECTOMY  1986   ANTERIOR LATERAL LUMBAR FUSION WITH PERCUTANEOUS SCREW 1 LEVEL N/A 12/10/2021   Procedure: L4-5 LATERAL INTERBODY FUSION WITH POSTERIOR PERCUTANEOUS FIXATION;  Surgeon: Venetia Night, MD;  Location: [facility] ORS;  Service: Neurosurgery;  Laterality: N/A;   APPLICATION OF INTRAOPERATIVE CT SCAN N/A 12/10/2021   Procedure: APPLICATION OF INTRAOPERATIVE CT SCAN;  Surgeon: Venetia Night, MD;  Location: [facility] OR[facility];  Service: Neurosurgery;  Laterality: N/A;   BREAST EXCISIONAL BIOPSY Left 1970's   benign   BREAST SURGERY  1968  CATARACT EXTRACTION  2014/2015   Both eyes   EYE SURGERY     FRACTURE SURGERY  01/2009   Fractured Left ankle, plate on outside of ankle 2 rods through/across ankle from inside of ankle   OOPHORECTOMY     TONSILLECTOMY AND ADENOIDECTOMY  1960   Family History  Problem Relation Age of Onset   Arthritis Mother    Hypertension Mother    Arthritis Father    Hypertension Father    Arthritis Maternal Grandmother    Cancer Brother        Colon   Cancer Maternal Aunt        Breast Cancer   Breast cancer Maternal Aunt     Breast cancer Cousin    Breast cancer Cousin    Hyperlipidemia Son    Hyperlipidemia Son    Social History   Socioeconomic History   Marital status: Widowed    Spouse name: Not on file   Number of children: Not on file   Years of education: Not on file   Highest education level: Not on file  Occupational History   Not on file  Tobacco Use   Smoking status: Never   Smokeless tobacco: Never  Vaping Use   Vaping Use: Never used  Substance and Sexual Activity   Alcohol use: No    Alcohol/week: 0.0 standard drinks of alcohol   Drug use: No   Sexual activity: Never    Partners: Male  Other Topics Concern   Not on file  Social History Narrative   Widowed   Retired   Children 3   Pets: none   Caffeine- Coffee, Tea, rare soda   Social Determinants of Health   Financial Resource Strain: Low Risk  (03/11/2023)   Overall Financial Resource Strain (CARDIA)    Difficulty of Paying Living Expenses: Not hard at all  Food Insecurity: No Food Insecurity (03/11/2023)   Hunger Vital Sign    Worried About Running Out of Food in the Last Year: Never true    Ran Out of Food in the Last Year: Never true  Transportation Needs: No Transportation Needs (03/11/2023)   PRAPARE - Administrator, Civil Service (Medical): No    Lack of Transportation (Non-Medical): No  Physical Activity: Inactive (03/11/2023)   Exercise Vital Sign    Days of Exercise per Week: 0 days    Minutes of Exercise per Session: 0 min  Stress: No Stress Concern Present (03/11/2023)   [employer] of Occupational Health - Occupational Stress Questionnaire    Feeling of Stress : Not at all  Social Connections: Moderately Integrated (03/11/2023)   Social Connection and Isolation Panel [NHANES]    Frequency of Communication with Friends and Family: More than three times a week    Frequency of Social Gatherings with Friends and Family: Twice a week    Attends Religious Services: More than 4 times per year     Active Member of [employer] or Organizations: Yes    Attends Banker Meetings: More than 4 times per year    Marital Status: Widowed    Tobacco Counseling Counseling given: Not Answered   Clinical Intake:  Pre-visit preparation completed: Yes  Pain : No/denies pain     Diabetes: No  How often do you need to have someone help you when you read instructions, pamphlets, or other written materials from your doctor or pharmacy?: 1 - Never  Diabetic?  no  Interpreter Needed?: No  Information entered  by :Remi Haggard LPN   Activities of Daily Living    03/11/2023    8:43 AM  In your present state of health, do you have any difficulty performing the following activities:  Hearing? 0  Vision? 0  Difficulty concentrating or making decisions? 0  Walking or climbing stairs? 0  Dressing or bathing? 0  Doing errands, shopping? 0  Preparing Food and eating ? N  Using the Toilet? N  In the past six months, have you accidently leaked urine? Y  Do you have problems with loss of bowel control? N  Managing your Medications? N  Managing your Finances? N  Housekeeping or managing your Housekeeping? N    Patient Care Team: Glori Luis, MD as PCP - General (Family Medicine)  Indicate any recent Medical Services you may have received from other than [facility] providers in the past year (date may be approximate).     Assessment:   This is a routine wellness examination for [place].  Hearing/Vision screen Hearing Screening - Comments:: No trouble hearing Vision Screening - Comments:: [facility] Up to date  Dietary issues and exercise activities discussed: Current Exercise Habits: The patient does not participate in regular exercise at present   Goals Addressed             This Visit's Progress    Patient Stated       Continue current lifetyle       Depression Screen    03/11/2023    8:47 AM 01/18/2023   12:10 PM 12/04/2022    1:44 PM 06/03/2022     3:49 PM 03/05/2022    3:04 PM 11/28/2021    8:25 AM 03/04/2021    2:13 PM  PHQ 2/9 Scores  PHQ - 2 Score 0 0 0 0 0 0 0  PHQ- 9 Score 0 0 0        Fall Risk    03/11/2023    8:37 AM 01/18/2023   12:09 PM 12/04/2022    1:44 PM 06/03/2022    3:49 PM 03/05/2022    3:03 PM  Fall Risk   Falls in the past year? 0 0 0 0 0  Number falls in past yr: 0 0 0 0 0  Injury with Fall? 0 0 0 0   Risk for fall due to :  No Fall Risks No Fall Risks No Fall Risks   Follow up Falls evaluation completed;Education provided;Falls prevention discussed Falls evaluation completed Falls evaluation completed Falls evaluation completed Falls evaluation completed    FALL RISK PREVENTION PERTAINING TO THE HOME:  Any stairs in or around the home? Yes  If so, are there any without handrails? No  Home free of loose throw rugs in walkways, pet beds, electrical cords, etc? Yes  Adequate lighting in your home to reduce risk of falls? Yes   ASSISTIVE DEVICES UTILIZED TO PREVENT FALLS:  Life alert? No  Use of a cane, walker or w/c? No  Grab bars in the bathroom? Yes  Shower chair or bench in shower? No  Elevated toilet seat or a handicapped toilet? Yes   TIMED UP AND GO:  Was the test performed? No .    Cognitive Function:    02/14/2018    8:58 AM 02/11/2017    9:05 AM  MMSE - Mini Mental State Exam  Orientation to time 5 5  Orientation to Place 5 5  Registration 3 3  Attention/ Calculation 5 5  Recall 3 3  Language- name 2 objects 2 2  Language- repeat 1 1  Language- follow 3 step command 3 3  Language- read & follow direction 1 1  Write a sentence 1 1  Copy design 1 1  Total score 30 30        03/11/2023    8:41 AM 03/01/2020    2:19 PM 03/01/2019   11:57 AM  6CIT Screen  What Year? 0 points 0 points 0 points  What month? 0 points 0 points 0 points  What time? 0 points  0 points  Count back from 20 0 points  0 points  Months in reverse 0 points 0 points 0 points  Repeat phrase 0 points  0  points  Total Score 0 points  0 points    Immunizations Immunization History  Administered Date(s) Administered   Fluad Quad(high Dose 65+) 08/14/2021   Influenza, High Dose Seasonal PF 08/01/2016, 07/16/2017, 07/06/2018, 08/01/2019   Influenza-Unspecified 08/13/2022   PFIZER(Purple Top)SARS-COV-2 Vaccination 11/06/2019, 11/28/2019, 08/09/2020   PNEUMOCOCCAL CONJUGATE-20 12/04/2022   Pneumococcal Conjugate-13 09/18/2016   Pneumococcal-Unspecified 01/25/2012   Tdap 01/21/2015   Zoster Recombinat (Shingrix) 01/01/2021, 03/11/2021   Zoster, Live 07/26/2020    TDAP status: Up to date  Flu Vaccine status: Up to date  Pneumococcal vaccine status: Up to date  Covid-19 vaccine status: Information provided on how to obtain vaccines.   Qualifies for Shingles Vaccine? No   Zostavax completed Yes   Shingrix Completed?: Yes  Screening Tests Health Maintenance  Topic Date Due   COVID-19 Vaccine (4 - 2023-24 season) 03/27/2023 (Originally 06/19/2022)   INFLUENZA VACCINE  05/20/2023   Medicare Annual Wellness (AWV)  03/10/2024   DTaP/Tdap/Td (2 - Td or Tdap) 01/20/2025   Pneumonia Vaccine 99+ Years old  Completed   DEXA SCAN  Completed   Zoster Vaccines- Shingrix  Completed   HPV VACCINES  Aged Out    Health Maintenance  There are no preventive care reminders to display for this patient.   Colorectal cancer screening: No longer required.   Mammogram status: No longer required due to age.  Bone Density scheduled for 03-16-2023  Lung Cancer Screening: (Low Dose CT Chest recommended if Age 54-80 years, 30 pack-year currently smoking OR have quit w/in 15years.) does not qualify.   Lung Cancer Screening Referral:   Additional Screening:  Hepatitis C Screening: does not qualify;   Vision Screening: Recommended annual ophthalmology exams for early detection of glaucoma and other disorders of the eye. Is the patient up to date with their annual eye exam?  Yes  Who is the  provider or what is the name of the office in which the patient attends annual eye exams? [facility] If pt is not established with a provider, would they like to be referred to a provider to establish care? No .   Dental Screening: Recommended annual dental exams for proper oral hygiene  Community Resource Referral / Chronic Care Management: CRR required this visit?  No   CCM required this visit?  No      Plan:     I have personally reviewed and noted the following in the patient's chart:   Medical and social history Use of alcohol, tobacco or illicit drugs  Current medications and supplements including opioid prescriptions. Patient is not currently taking opioid prescriptions. Functional ability and status Nutritional status Physical activity Advanced directives List of other physicians Hospitalizations, surgeries, and ER visits in previous 12 months Vitals Screenings to include cognitive, depression,  and falls Referrals and appointments  In addition, I have reviewed and discussed with patient certain preventive protocols, quality metrics, and best practice recommendations. A written personalized care plan for preventive services as well as general preventive health recommendations were provided to patient.     Remi Haggard, LPN   1/61/0960   Nurse Notes:

## 2023-03-11 NOTE — Patient Instructions (Signed)
[name] , Thank you for taking time to come for your Medicare Wellness Visit. I appreciate your ongoing commitment to your health goals. Please review the following plan we discussed and let me know if I can assist you in the future.   Screening recommendations/referrals: Colonoscopy: no longer required Mammogram: no longer required Bone Density: scheduled Recommended yearly ophthalmology/optometry visit for glaucoma screening and checkup Recommended yearly dental visit for hygiene and checkup  Vaccinations: Influenza vaccine: up to date Pneumococcal vaccine: up to date Tdap vaccine: up to date Shingles vaccine: up to date    Advanced directives: on file    Preventive Care 65 Years and Older, Female Preventive care refers to lifestyle choices and visits with your health care provider that can promote health and wellness. What does preventive care include? A yearly physical exam. This is also called an annual well check. Dental exams once or twice a year. Routine eye exams. Ask your health care provider how often you should have your eyes checked. Personal lifestyle choices, including: Daily care of your teeth and gums. Regular physical activity. Eating a healthy diet. Avoiding tobacco and drug use. Limiting alcohol use. Practicing safe sex. Taking low-dose aspirin every day. Taking vitamin and mineral supplements as recommended by your health care provider. What happens during an annual well check? The services and screenings done by your health care provider during your annual well check will depend on your age, overall health, lifestyle risk factors, and family history of disease. Counseling  Your health care provider may ask you questions about your: Alcohol use. Tobacco use. Drug use. Emotional well-being. Home and relationship well-being. Sexual activity. Eating habits. History of falls. Memory and ability to understand (cognition). Work and work  Astronomer. Reproductive health. Screening  You may have the following tests or measurements: Height, weight, and BMI. Blood pressure. Lipid and cholesterol levels. These may be checked every 5 years, or more frequently if you are over 49 years old. Skin check. Lung cancer screening. You may have this screening every year starting at age 68 if you have a 30-pack-year history of smoking and currently smoke or have quit within the past 15 years. Fecal occult blood test (FOBT) of the stool. You may have this test every year starting at age 11. Flexible sigmoidoscopy or colonoscopy. You may have a sigmoidoscopy every 5 years or a colonoscopy every 10 years starting at age 29. Hepatitis C blood test. Hepatitis B blood test. Sexually transmitted disease (STD) testing. Diabetes screening. This is done by checking your blood sugar (glucose) after you have not eaten for a while (fasting). You may have this done every 1-3 years. Bone density scan. This is done to screen for osteoporosis. You may have this done starting at age 23. Mammogram. This may be done every 1-2 years. Talk to your health care provider about how often you should have regular mammograms. Talk with your health care provider about your test results, treatment options, and if necessary, the need for more tests. Vaccines  Your health care provider may recommend certain vaccines, such as: Influenza vaccine. This is recommended every year. Tetanus, diphtheria, and acellular pertussis (Tdap, Td) vaccine. You may need a Td booster every 10 years. Zoster vaccine. You may need this after age 78. Pneumococcal 13-valent conjugate (PCV13) vaccine. One dose is recommended after age 68. Pneumococcal polysaccharide (PPSV23) vaccine. One dose is recommended after age 19. Talk to your health care provider about which screenings and vaccines you need and how often you  need them. This information is not intended to replace advice given to you by  your health care provider. Make sure you discuss any questions you have with your health care provider. Document Released: 11/01/2015 Document Revised: 06/24/2016 Document Reviewed: 08/06/2015 Elsevier Interactive Patient Education  2017 [employer].  Fall Prevention in the Home Falls can cause injuries. They can happen to people of all ages. There are many things you can do to make your home safe and to help prevent falls. What can I do on the outside of my home? Regularly fix the edges of walkways and driveways and fix any cracks. Remove anything that might make you trip as you walk through a door, such as a raised step or threshold. Trim any bushes or trees on the path to your home. Use bright outdoor lighting. Clear any walking paths of anything that might make someone trip, such as rocks or tools. Regularly check to see if handrails are loose or broken. Make sure that both sides of any steps have handrails. Any raised decks and porches should have guardrails on the edges. Have any leaves, snow, or ice cleared regularly. Use sand or salt on walking paths during winter. Clean up any spills in your garage right away. This includes oil or grease spills. What can I do in the bathroom? Use night lights. Install grab bars by the toilet and in the tub and shower. Do not use towel bars as grab bars. Use non-skid mats or decals in the tub or shower. If you need to sit down in the shower, use a plastic, non-slip stool. Keep the floor dry. Clean up any water that spills on the floor as soon as it happens. Remove soap buildup in the tub or shower regularly. Attach bath mats securely with double-sided non-slip rug tape. Do not have throw rugs and other things on the floor that can make you trip. What can I do in the bedroom? Use night lights. Make sure that you have a light by your bed that is easy to reach. Do not use any sheets or blankets that are too big for your bed. They should not hang  down onto the floor. Have a firm chair that has side arms. You can use this for support while you get dressed. Do not have throw rugs and other things on the floor that can make you trip. What can I do in the kitchen? Clean up any spills right away. Avoid walking on wet floors. Keep items that you use a lot in easy-to-reach places. If you need to reach something above you, use a strong step stool that has a grab bar. Keep electrical cords out of the way. Do not use floor polish or wax that makes floors slippery. If you must use wax, use non-skid floor wax. Do not have throw rugs and other things on the floor that can make you trip. What can I do with my stairs? Do not leave any items on the stairs. Make sure that there are handrails on both sides of the stairs and use them. Fix handrails that are broken or loose. Make sure that handrails are as long as the stairways. Check any carpeting to make sure that it is firmly attached to the stairs. Fix any carpet that is loose or worn. Avoid having throw rugs at the top or bottom of the stairs. If you do have throw rugs, attach them to the floor with carpet tape. Make sure that you have a light switch  at the top of the stairs and the bottom of the stairs. If you do not have them, ask someone to add them for you. What else can I do to help prevent falls? Wear shoes that: Do not have high heels. Have rubber bottoms. Are comfortable and fit you well. Are closed at the toe. Do not wear sandals. If you use a stepladder: Make sure that it is fully opened. Do not climb a closed stepladder. Make sure that both sides of the stepladder are locked into place. Ask someone to hold it for you, if possible. Clearly mark and make sure that you can see: Any grab bars or handrails. First and last steps. Where the edge of each step is. Use tools that help you move around (mobility aids) if they are needed. These  include: Canes. Walkers. Scooters. Crutches. Turn on the lights when you go into a dark area. Replace any light bulbs as soon as they burn out. Set up your furniture so you have a clear path. Avoid moving your furniture around. If any of your floors are uneven, fix them. If there are any pets around you, be aware of where they are. Review your medicines with your doctor. Some medicines can make you feel dizzy. This can increase your chance of falling. Ask your doctor what other things that you can do to help prevent falls. This information is not intended to replace advice given to you by your health care provider. Make sure you discuss any questions you have with your health care provider. Document Released: 08/01/2009 Document Revised: 03/12/2016 Document Reviewed: 11/09/2014 Elsevier Interactive Patient Education  2017 [employer].

## 2023-04-01 ENCOUNTER — Telehealth: Payer: Self-pay | Admitting: Family Medicine

## 2023-04-01 ENCOUNTER — Other Ambulatory Visit: Payer: Self-pay

## 2023-04-01 DIAGNOSIS — I10 Essential (primary) hypertension: Secondary | ICD-10-CM

## 2023-04-01 MED ORDER — AMLODIPINE BESYLATE-VALSARTAN 10-160 MG PO TABS
1.0000 | ORAL_TABLET | Freq: Every day | ORAL | 1 refills | Status: DC
Start: 2023-04-01 — End: 2023-10-21

## 2023-04-01 NOTE — Telephone Encounter (Signed)
Prescription Request  04/01/2023  LOV: 01/18/2023  What is the name of the medication or equipment?  amLODipine-valsartan (EXFORGE) 10-160 MG tablet   Have you contacted your pharmacy to request a refill? Yes   Which pharmacy would you like this sent to?  [id number] [place], [place] - [address] AT [facility] OF [address] [place] [address] Phone: [phone] Fax: [phone]    Patient notified that their request is being sent to the clinical staff for review and that they should receive a response within 2 business days.   Please advise at [facility] [phone]

## 2023-04-01 NOTE — Telephone Encounter (Signed)
Prescription sent to [facility]

## 2023-04-21 ENCOUNTER — Ambulatory Visit (INDEPENDENT_AMBULATORY_CARE_PROVIDER_SITE_OTHER): Payer: Medicare Other | Admitting: Family Medicine

## 2023-04-21 ENCOUNTER — Encounter: Payer: Self-pay | Admitting: Family Medicine

## 2023-04-21 VITALS — BP 126/76 | HR 67 | Temp 98.4°F | Ht 62.5 in | Wt 134.8 lb

## 2023-04-21 DIAGNOSIS — R0781 Pleurodynia: Secondary | ICD-10-CM | POA: Diagnosis not present

## 2023-04-21 DIAGNOSIS — I10 Essential (primary) hypertension: Secondary | ICD-10-CM | POA: Diagnosis not present

## 2023-04-21 DIAGNOSIS — E785 Hyperlipidemia, unspecified: Secondary | ICD-10-CM | POA: Diagnosis not present

## 2023-04-21 LAB — BASIC METABOLIC PANEL
BUN: 18 mg/dL (ref 6–23)
CO2: 27 mEq/L (ref 19–32)
Calcium: 9 mg/dL (ref 8.4–10.5)
Chloride: 103 mEq/L (ref 96–112)
Creatinine, Ser: 0.92 mg/dL (ref 0.40–1.20)
GFR: 57.44 mL/min — ABNORMAL LOW (ref >60.00)
Glucose, Bld: 94 mg/dL (ref 70–99)
Potassium: 4 mEq/L (ref 3.5–5.1)
Sodium: 137 mEq/L (ref 135–145)

## 2023-04-21 NOTE — Assessment & Plan Note (Signed)
Chronic issue.  She will continue to work on diet and remain active.

## 2023-04-21 NOTE — Progress Notes (Signed)
Marikay Alar, MD Phone: [phone]  Jane Willis is a 82 y.o. female who presents today for f/u.  HYPERTENSION Disease Monitoring Home BP Monitoring 135-142/75-82 Chest pain- no    Dyspnea- no Medications Compliance-  taking amlodipine-valsartan.  Edema- no BMET    Component Value Date/Time   NA 135 12/14/2022 0947   K 4.3 12/14/2022 0947   CL 100 12/14/2022 0947   CO2 26 12/14/2022 0947   GLUCOSE 82 12/14/2022 0947   BUN 18 12/14/2022 0947   CREATININE 0.88 12/14/2022 0947   CREATININE 0.80 12/04/2022 1434   CALCIUM 9.4 12/14/2022 0947   GFRNONAA 58 (L) 12/10/2021 1415   Rib soreness: Patient notes some rib soreness over the last few days.  She was doing some stretching and reaching above her head and notes it started after that.  Notes if she moves in certain ways will bring the discomfort on.  Hyperlipidemia: Patient is eating diet with plenty of lean meats.  Eats lots of vegetables.  Tries to avoid heavy foods.  Limits sugary drinks.  She stays active though does not do any formal exercise.  Patient is concerned about whether or not her blood pressure medicine could contribute to a brain bleed in the future.  She has 2 friends that have been placed on Eliquis and then subsequently 1 on warfarin and she notes they were advised that if they had falls there could be issues with brain bleeds.  This made her think to ask about her blood pressure medication.  Social History   Tobacco Use  Smoking Status Never  Smokeless Tobacco Never    Current Outpatient Medications on File Prior to Visit  Medication Sig Dispense Refill   acetaminophen (TYLENOL) 500 MG tablet Take 1,000 mg by mouth as needed for moderate pain.     amLODipine-valsartan (EXFORGE) 10-160 MG tablet Take 1 tablet by mouth daily. 90 tablet 1   Ascorbic Acid (VITAMIN C) 1000 MG tablet Take 1,000 mg by mouth daily.     aspirin EC 81 MG tablet Take 81 mg by mouth daily. Swallow whole.     Cholecalciferol  (VITAMIN D-3) 1000 UNITS CAPS Take 1,000 capsules by mouth 2 (two) times daily.     Polyethyl Glycol-Propyl Glycol (SYSTANE OP) Place 1 drop into both eyes daily as needed (dry eyes).     senna (SENOKOT) 8.6 MG TABS tablet Take 1 tablet (8.6 mg total) by mouth daily as needed for mild constipation. 30 tablet 0   vitamin B-12 (CYANOCOBALAMIN) 1000 MCG tablet Take 1,000 mcg by mouth 2 (two) times daily.     No current facility-administered medications on file prior to visit.     ROS see history of present illness  Objective  Physical Exam Vitals:   04/21/23 0902 04/21/23 0937  BP: 134/82 126/76  Pulse: 67   Temp: 98.4 F (36.9 C)   SpO2: 99%     BP Readings from Last 3 Encounters:  04/21/23 126/76  01/18/23 136/84  12/04/22 (!) 146/76   Wt Readings from Last 3 Encounters:  04/21/23 134 lb 12.8 oz (61.1 kg)  01/18/23 138 lb (62.6 kg)  12/04/22 139 lb 6 oz (63.2 kg)    Physical Exam Constitutional:      General: She is not in acute distress.    Appearance: She is not diaphoretic.  Cardiovascular:     Rate and Rhythm: Normal rate and regular rhythm.     Heart sounds: Normal heart sounds.  Pulmonary:  Effort: Pulmonary effort is normal.     Breath sounds: Normal breath sounds.  Musculoskeletal:     Right lower leg: No edema.     Left lower leg: No edema.     Comments: No rib tenderness noted  Skin:    General: Skin is warm and dry.  Neurological:     Mental Status: She is alert.      Assessment/Plan: Please see individual problem list.  Primary hypertension Assessment & Plan: Chronic issue.  Adequately controlled for age.  She will continue amlodipine-valsartan 10 mg - 160 mg once daily.  Discussed I am not concerned about her current blood pressure medication contributing to any risk of brain bleeds.  Orders: -     Basic metabolic panel  Hyperlipidemia, unspecified hyperlipidemia type Assessment & Plan: Chronic issue.  She will continue to work on diet  and remain active.   Rib pain Assessment & Plan: Suspect strain of muscles within her chest wall. Discussed avoiding movements that trigger the discomfort.  She will monitor.     Return in about 6 months (around 10/22/2023).   Marikay Alar, MD [facility]

## 2023-04-21 NOTE — Assessment & Plan Note (Addendum)
Chronic issue.  Adequately controlled for age.  She will continue amlodipine-valsartan 10 mg - 160 mg once daily.  Discussed I am not concerned about her current blood pressure medication contributing to any risk of brain bleeds.

## 2023-04-21 NOTE — Assessment & Plan Note (Addendum)
Suspect strain of muscles within her chest wall. Discussed avoiding movements that trigger the discomfort.  She will monitor.

## 2023-05-11 ENCOUNTER — Telehealth: Payer: Self-pay | Admitting: Family Medicine

## 2023-05-11 NOTE — Telephone Encounter (Signed)
Prescription Request  05/11/2023  LOV: 04/21/2023  What is the name of the medication or equipment? amLODipine   Have you contacted your pharmacy to request a refill? Yes   Which pharmacy would you like this sent to?  [id number] [place], [place] - [address] AT [facility] [address] [place] [address] Phone: [phone] Fax: [phone]    Patient notified that their request is being sent to the clinical staff for review and that they should receive a response within 2 business days.   Please advise at [facility] [phone]

## 2023-05-11 NOTE — Telephone Encounter (Signed)
Called Patient due to this medication last being sent in on 04/01/23 with a quantity of 90 pills with 1 refill and she takes 1 pill a day that is a 6 month supply.

## 2023-05-12 ENCOUNTER — Other Ambulatory Visit: Payer: Self-pay

## 2023-05-12 NOTE — Telephone Encounter (Signed)
Patient only got 30 pills with no refills , read off from pill on bottle ,last month. Patient went to pharmacy yesterday, 05/11/23 and pharmacy refilled for 30 days with no refills, needs doctor approval. Next month she will need a new prescription. Patient was advised to call office when she is down 5 pills to request a refill, next month.

## 2023-05-12 NOTE — Telephone Encounter (Signed)
Spoke to [employer] and they state the Patient has medication then 1 refill.

## 2023-05-22 NOTE — Discharge Instructions (Addendum)
Instructions after Total Knee Replacement   James P. Angie Fava., M.D.    Dept. of Orthopaedics & Sports Medicine [facility] [address] [place], [place]  [address]  Phone: [phone]   Fax: [phone]       [email or web address]       DIET: Drink plenty of non-alcoholic fluids. Resume your normal diet. Include foods high in fiber.  ACTIVITY:  You may use crutches or a walker with weight-bearing as tolerated, unless instructed otherwise. You may be weaned off of the walker or crutches by your Physical Therapist.  Do NOT place pillows under the knee. Anything placed under the knee could limit your ability to straighten the knee.   Continue doing gentle exercises. Exercising will reduce the pain and swelling, increase motion, and prevent muscle weakness.   Please continue to use the TED compression stockings for 6 weeks. You may remove the stockings at night, but should reapply them in the morning. Do not drive or operate any equipment until instructed.  WOUND CARE:  Continue to use the PolarCare or ice packs periodically to reduce pain and swelling. You may bathe or shower after the staples are removed at the first office visit following surgery. The Aquacel bandage remains in place for 7 days postoperatively.  It can be changed out to a honeycomb dressing after this time.  At home physical therapy can help with this.  MEDICATIONS: You may resume your regular medications. Please take the pain medication as prescribed on the medication. Do not take pain medication on an empty stomach. You have been given a prescription for a blood thinner -aspirin 81 mg.  Please take this once in the morning and once at night.  You will take this and wear your TED hose in conjunction to help prevent DVTs. Do not drive or drink alcoholic beverages when taking pain medications.  CALL THE OFFICE FOR: Temperature above 101 degrees Excessive bleeding or drainage on the  dressing. Excessive swelling, coldness, or paleness of the toes. Persistent nausea and vomiting.  FOLLOW-UP:  You should have an appointment to return to the office in 10-14 days after surgery. Arrangements have been made for continuation of Physical Therapy (either home therapy or outpatient therapy).     [facility] Department Directory         [email or web address]       [email or web address]          Cardiology  Appointments: [place] - [phone]  Endocrinology  Appointments: [place] [phone] [place] - [phone]  Gastroenterology  Appointments: [place] [phone] [place] - [phone]        General Surgery   Appointments: [facility]  Internal Medicine/Family Medicine  Appointments: [facility] [place] - [phone] [place] - [phone]  Metabolic and Weigh Loss Surgery  Appointments: [facility]        Neurology  Appointments: [place] [phone] [place] - [phone]  Neurosurgery  Appointments: [place]  Obstetrics & Gynecology  Appointments: [place] [phone] [place] - [phone]        Pediatrics  Appointments: [place] [place] [phone] [place] - [phone]  Physiatry  Appointments: [place] [phone]  Physical Therapy  Appointments: [place] - [phone]        Podiatry  Appointments: [place] [phone] [place] - [phone]  Pulmonology  Appointments: [place]  Rheumatology  Appointments: [place] [phone]        [place] Location: [facility]  [address] [place], [place]  [address]  [place] Location: [facility] [address] [place], [place]  [address]  [place] Location: [facility] [address] [place], [place]  [address]

## 2023-05-24 ENCOUNTER — Other Ambulatory Visit: Payer: Self-pay

## 2023-05-24 ENCOUNTER — Encounter
Admission: RE | Admit: 2023-05-24 | Discharge: 2023-05-24 | Disposition: A | Discharge status: Home / Self Care | Payer: Medicare Other | Source: Ambulatory Visit | Attending: Orthopedic Surgery | Admitting: Orthopedic Surgery

## 2023-05-24 VITALS — BP 104/60 | HR 67 | Resp 12 | Ht 62.0 in | Wt 135.0 lb

## 2023-05-24 DIAGNOSIS — R8271 Bacteriuria: Secondary | ICD-10-CM | POA: Diagnosis not present

## 2023-05-24 DIAGNOSIS — Z01818 Encounter for other preprocedural examination: Secondary | ICD-10-CM | POA: Insufficient documentation

## 2023-05-24 DIAGNOSIS — R7303 Prediabetes: Secondary | ICD-10-CM | POA: Diagnosis not present

## 2023-05-24 DIAGNOSIS — R8281 Pyuria: Secondary | ICD-10-CM | POA: Insufficient documentation

## 2023-05-24 DIAGNOSIS — M1712 Unilateral primary osteoarthritis, left knee: Secondary | ICD-10-CM | POA: Insufficient documentation

## 2023-05-24 DIAGNOSIS — E538 Deficiency of other specified B group vitamins: Secondary | ICD-10-CM | POA: Diagnosis not present

## 2023-05-24 DIAGNOSIS — Z01812 Encounter for preprocedural laboratory examination: Secondary | ICD-10-CM

## 2023-05-24 DIAGNOSIS — R8290 Unspecified abnormal findings in urine: Secondary | ICD-10-CM

## 2023-05-24 LAB — URINALYSIS, ROUTINE W REFLEX MICROSCOPIC
Bacteria, UA: NONE SEEN
Bilirubin Urine: NEGATIVE
Glucose, UA: NEGATIVE mg/dL
Hgb urine dipstick: NEGATIVE
Ketones, ur: NEGATIVE mg/dL
Nitrite: POSITIVE — AB
Protein, ur: NEGATIVE mg/dL
Specific Gravity, Urine: 1.014 (ref 1.005–1.030)
WBC, UA: >50 WBC/hpf (ref 0–5)
pH: 5 (ref 5.0–8.0)

## 2023-05-24 LAB — CBC
HCT: 37.3 % (ref 36.0–46.0)
Hemoglobin: 12 g/dL (ref 12.0–15.0)
MCH: 29.5 pg (ref 26.0–34.0)
MCHC: 32.2 g/dL (ref 30.0–36.0)
MCV: 91.6 fL (ref 80.0–100.0)
Platelets: 505 10*3/uL — ABNORMAL HIGH (ref 150–400)
RBC: 4.07 MIL/uL (ref 3.87–5.11)
RDW: 13.5 % (ref 11.5–15.5)
WBC: 7.6 10*3/uL (ref 4.0–10.5)
nRBC: 0 % (ref 0.0–0.2)

## 2023-05-24 LAB — COMPREHENSIVE METABOLIC PANEL
ALT: 13 U/L (ref 0–44)
AST: 14 U/L — ABNORMAL LOW (ref 15–41)
Albumin: 3.5 g/dL (ref 3.5–5.0)
Alkaline Phosphatase: 68 U/L (ref 38–126)
Anion gap: 10 (ref 5–15)
BUN: 24 mg/dL — ABNORMAL HIGH (ref 8–23)
CO2: 22 mmol/L (ref 22–32)
Calcium: 9.1 mg/dL (ref 8.9–10.3)
Chloride: 103 mmol/L (ref 98–111)
Creatinine, Ser: 0.92 mg/dL (ref 0.44–1.00)
GFR, Estimated: >60 mL/min (ref >60)
Glucose, Bld: 90 mg/dL (ref 70–99)
Potassium: 4.1 mmol/L (ref 3.5–5.1)
Sodium: 135 mmol/L (ref 135–145)
Total Bilirubin: 0.6 mg/dL (ref 0.3–1.2)
Total Protein: 7.5 g/dL (ref 6.5–8.1)

## 2023-05-24 LAB — HEMOGLOBIN A1C
Hgb A1c MFr Bld: 5.8 % — ABNORMAL HIGH (ref 4.8–5.6)
Mean Plasma Glucose: 119.76 mg/dL

## 2023-05-24 LAB — SEDIMENTATION RATE: Sed Rate: 46 mm/hr — ABNORMAL HIGH (ref 0–30)

## 2023-05-24 LAB — TYPE AND SCREEN
ABO/RH(D): A POS
Antibody Screen: NEGATIVE

## 2023-05-24 LAB — C-REACTIVE PROTEIN: CRP: 1.8 mg/dL — ABNORMAL HIGH (ref <1.0)

## 2023-05-24 LAB — SURGICAL PCR SCREEN
MRSA, PCR: NEGATIVE
Staphylococcus aureus: NEGATIVE

## 2023-05-24 NOTE — Patient Instructions (Addendum)
Your procedure is scheduled on: Friday 06/04/23 To find out your arrival time, please call [phone] between 1PM - 3PM on:   Thursday 06/03/23  Report to the Registration Desk on the 1st floor of the [facility]. FREE Valet parking is available.  If your arrival time is 6:00 am, do not arrive before that time as the [facility] entrance doors do not open until 6:00 am.  REMEMBER: Instructions that are not followed completely may result in serious medical risk, up to and including death; or upon the discretion of your surgeon and anesthesiologist your surgery may need to be rescheduled.  Do not eat food after midnight the night before surgery.  No gum chewing or hard candies.  You may however, drink CLEAR liquids up to 2 hours before you are scheduled to arrive for your surgery. Do not drink anything within 2 hours of your scheduled arrival time.  Clear liquids include: - water  - apple juice without pulp - gatorade (not RED colors) - black coffee or tea (Do NOT add milk or creamers to the coffee or tea) Do NOT drink anything that is not on this list.  Type 1 and Type 2 diabetics should only drink water.  In addition, your doctor has ordered for you to drink the provided:  Ensure Pre-Surgery Clear Carbohydrate Drink  Drinking this carbohydrate drink up to two hours before surgery helps to reduce insulin resistance and improve patient outcomes. Please complete drinking 2 hours before scheduled arrival time.  One week prior to surgery: Stop Anti-inflammatories (NSAIDS) such as Advil, Aleve, Ibuprofen, Motrin, Naproxen, Naprosyn and Aspirin based products such as Excedrin, Goody's Powder, BC Powder. You may however, continue to take Tylenol if needed for pain up until the day of surgery.  Stop ANY OVER THE COUNTER supplements and vitamins until after surgery.  Continue taking all prescribed medications until the day of surgery. Stop you aspirin on Friday 05/28/23  TAKE ONLY THESE  MEDICATIONS THE MORNING OF SURGERY WITH A SIP OF WATER:  NONE  No Alcohol for 24 hours before or after surgery.  No Smoking including e-cigarettes for 24 hours before surgery.  No chewable tobacco products for at least 6 hours before surgery.  No nicotine patches on the day of surgery.  Do not use any "recreational" drugs for at least a week (preferably 2 weeks) before your surgery.  Please be advised that the combination of cocaine and anesthesia may have negative outcomes, up to and including death. If you test positive for cocaine, your surgery will be cancelled.  On the morning of surgery brush your teeth with toothpaste and water, you may rinse your mouth with mouthwash if you wish. Do not swallow any toothpaste or mouthwash.  Use CHG Soap or wipes as directed on instruction sheet. Shower daily with CHG soap beginning Monday 05/31/23  Do not wear lotions, powders, or perfumes on the day of surgery.   Do not shave body hair from the neck down 48 hours before surgery.  Wear comfortable clothing (specific to your surgery type) to the hospital.  Do not wear jewelry, make-up, hairpins, clips or nail polish.  Contact lenses, hearing aids and dentures may not be worn into surgery.  Do not bring valuables to the hospital. [facility] is not responsible for any missing/lost belongings or valuables.   Notify your doctor if there is any change in your medical condition (cold, fever, infection).  If you are being discharged the day of surgery, you will  not be allowed to drive home. You will need a responsible individual to drive you home and stay with you for 24 hours after surgery.   If you are taking public transportation, you will need to have a responsible individual with you.  If you are being admitted to the hospital overnight, leave your suitcase in the car. After surgery it may be brought to your room.  In case of increased patient census, it may be necessary for you, the  patient, to continue your postoperative care in the Same Day Surgery department.  After surgery, you can help prevent lung complications by doing breathing exercises.  Take deep breaths and cough every 1-2 hours. Your doctor may order a device called an Incentive Spirometer to help you take deep breaths. When coughing or sneezing, hold a pillow firmly against your incision with both hands. This is called "splinting." Doing this helps protect your incision. It also decreases belly discomfort.  Surgery Visitation Policy:  Patients undergoing a surgery or procedure may have two family members or support persons with them as long as the person is not COVID-19 positive or experiencing its symptoms.   Inpatient Visitation:    Visiting hours are 7 a.m. to 8 p.m. Up to four visitors are allowed at one time in a patient room. The visitors may rotate out with other people during the day. One designated support person (adult) may remain overnight.  Please call the Pre-admissions Testing Dept. at [phone] if you have any questions about these instructions.    Pre-operative 5 CHG Bath Instructions   You can play a key role in reducing the risk of infection after surgery. Your skin needs to be as free of germs as possible. You can reduce the number of germs on your skin by washing with CHG (chlorhexidine gluconate) soap before surgery. CHG is an antiseptic soap that kills germs and continues to kill germs even after washing.   DO NOT use if you have an allergy to chlorhexidine/CHG or antibacterial soaps. If your skin becomes reddened or irritated, stop using the CHG and notify one of our RNs at [phone].   Please shower with the CHG soap starting 4 days before surgery using the following schedule:     Please keep in mind the following:  DO NOT shave, including legs and underarms, starting the day of your first shower.   You may shave your face at any point before/day of surgery.  Place  clean sheets on your bed the day you start using CHG soap. Use a clean washcloth (not used since being washed) for each shower. DO NOT sleep with pets once you start using the CHG.   CHG Shower Instructions:  If you choose to wash your hair and private area, wash first with your normal shampoo/soap.  After you use shampoo/soap, rinse your hair and body thoroughly to remove shampoo/soap residue.  Turn the water OFF and apply about 3 tablespoons (45 ml) of CHG soap to a CLEAN washcloth.  Apply CHG soap ONLY FROM YOUR NECK DOWN TO YOUR TOES (washing for 3-5 minutes)  DO NOT use CHG soap on face, private areas, open wounds, or sores.  Pay special attention to the area where your surgery is being performed.  If you are having back surgery, having someone wash your back for you may be helpful. Wait 2 minutes after CHG soap is applied, then you may rinse off the CHG soap.  Pat dry with a clean towel  Put on  clean clothes/pajamas   If you choose to wear lotion, please use ONLY the CHG-compatible lotions on the back of this paper.     Additional instructions for the day of surgery: DO NOT APPLY any lotions, deodorants, cologne, or perfumes.   Put on clean/comfortable clothes.  Brush your teeth.  Ask your nurse before applying any prescription medications to the skin.      CHG Compatible Lotions   Aveeno Moisturizing lotion  Cetaphil Moisturizing Cream  Cetaphil Moisturizing Lotion  Clairol Herbal Essence Moisturizing Lotion, Dry Skin  Clairol Herbal Essence Moisturizing Lotion, Extra Dry Skin  Clairol Herbal Essence Moisturizing Lotion, Normal Skin  Curel Age Defying Therapeutic Moisturizing Lotion with Alpha Hydroxy  Curel Extreme Care Body Lotion  Curel Soothing Hands Moisturizing Hand Lotion  Curel Therapeutic Moisturizing Cream, Fragrance-Free  Curel Therapeutic Moisturizing Lotion, Fragrance-Free  Curel Therapeutic Moisturizing Lotion, Original Formula  Eucerin Daily Replenishing  Lotion  Eucerin Dry Skin Therapy Plus Alpha Hydroxy Crme  Eucerin Dry Skin Therapy Plus Alpha Hydroxy Lotion  Eucerin Original Crme  Eucerin Original Lotion  Eucerin Plus Crme Eucerin Plus Lotion  Eucerin TriLipid Replenishing Lotion  Keri Anti-Bacterial Hand Lotion  Keri Deep Conditioning Original Lotion Dry Skin Formula Softly Scented  Keri Deep Conditioning Original Lotion, Fragrance Free Sensitive Skin Formula  Keri Lotion Fast Absorbing Fragrance Free Sensitive Skin Formula  Keri Lotion Fast Absorbing Softly Scented Dry Skin Formula  Keri Original Lotion  Keri Skin Renewal Lotion Keri Silky Smooth Lotion  Keri Silky Smooth Sensitive Skin Lotion  Nivea Body Creamy Conditioning Oil  Nivea Body Extra Enriched Lotion  Nivea Body Original Lotion  Nivea Body Sheer Moisturizing Lotion Nivea Crme  Nivea Skin Firming Lotion  NutraDerm 30 Skin Lotion  NutraDerm Skin Lotion  NutraDerm Therapeutic Skin Cream  NutraDerm Therapeutic Skin Lotion  ProShield Protective Hand Cream  Provon moisturizing lotion  How to Use an Incentive Spirometer  An incentive spirometer is a tool that measures how well you are filling your lungs with each breath. Learning to take long, deep breaths using this tool can help you keep your lungs clear and active. This may help to reverse or lessen your chance of developing breathing (pulmonary) problems, especially infection. You may be asked to use a spirometer: After a surgery. If you have a lung problem or a history of smoking. After a long period of time when you have been unable to move or be active. If the spirometer includes an indicator to show the highest number that you have reached, your health care provider or respiratory therapist will help you set a goal. Keep a log of your progress as told by your health care provider. What are the risks? Breathing too quickly may cause dizziness or cause you to pass out. Take your time so you do not get dizzy or  light-headed. If you are in pain, you may need to take pain medicine before doing incentive spirometry. It is harder to take a deep breath if you are having pain. How to use your incentive spirometer  Sit up on the edge of your bed or on a chair. Hold the incentive spirometer so that it is in an upright position. Before you use the spirometer, breathe out normally. Place the mouthpiece in your mouth. Make sure your lips are closed tightly around it. Breathe in slowly and as deeply as you can through your mouth, causing the piston or the ball to rise toward the top of the chamber. Hold your breath  for 3-5 seconds, or for as long as possible. If the spirometer includes a coach indicator, use this to guide you in breathing. Slow down your breathing if the indicator goes above the marked areas. Remove the mouthpiece from your mouth and breathe out normally. The piston or ball will return to the bottom of the chamber. Rest for a few seconds, then repeat the steps 10 or more times. Take your time and take a few normal breaths between deep breaths so that you do not get dizzy or light-headed. Do this every 1-2 hours when you are awake. If the spirometer includes a goal marker to show the highest number you have reached (best effort), use this as a goal to work toward during each repetition. After each set of 10 deep breaths, cough a few times. This will help to make sure that your lungs are clear. If you have an incision on your chest or abdomen from surgery, place a pillow or a rolled-up towel firmly against the incision when you cough. This can help to reduce pain while taking deep breaths and coughing. General tips When you are able to get out of bed: Walk around often. Continue to take deep breaths and cough in order to clear your lungs. Keep using the incentive spirometer until your health care provider says it is okay to stop using it. If you have been in the hospital, you may be told to keep  using the spirometer at home. Contact a health care provider if: You are having difficulty using the spirometer. You have trouble using the spirometer as often as instructed. Your pain medicine is not giving enough relief for you to use the spirometer as told. You have a fever. Get help right away if: You develop shortness of breath. You develop a cough with bloody mucus from the lungs. You have fluid or blood coming from an incision site after you cough. Summary An incentive spirometer is a tool that can help you learn to take long, deep breaths to keep your lungs clear and active. You may be asked to use a spirometer after a surgery, if you have a lung problem or a history of smoking, or if you have been inactive for a long period of time. Use your incentive spirometer as instructed every 1-2 hours while you are awake. If you have an incision on your chest or abdomen, place a pillow or a rolled-up towel firmly against your incision when you cough. This will help to reduce pain. Get help right away if you have shortness of breath, you cough up bloody mucus, or blood comes from your incision when you cough. This information is not intended to replace advice given to you by your health care provider. Make sure you discuss any questions you have with your health care provider. Document Revised: 12/25/2019 Document Reviewed: 12/25/2019 Elsevier Patient Education  2023 Elsevier Inc.    Preoperative Educational Videos for Total Hip, Knee and Shoulder Replacements  To better prepare for surgery, please view our videos that explain the physical activity and discharge planning required to have the best surgical recovery at [facility].  [email or web address]  Questions? Call [phone] or email [email or web address]

## 2023-05-24 NOTE — Progress Notes (Signed)
  [facility] Perioperative Services: Pre-Admission/Anesthesia Testing  Abnormal Lab Notification   Date: 05/24/23  Name: Jane Willis MRN:   [id number]  Re: Abnormal labs noted during PAT appointment   Notified:    Provider Name Provider Role Notification Mode  Hooten, Illene Labrador, MD Orthopedics [facility]) Routed and/or faxed via [facility]   ABNORMAL LAB VALUE(S):   Lab Results  Component Value Date   COLORURINE YELLOW (A) 05/24/2023   APPEARANCEUR CLOUDY (A) 05/24/2023   LABSPEC 1.014 05/24/2023   PHURINE 5.0 05/24/2023   GLUCOSEU NEGATIVE 05/24/2023   HGBUR NEGATIVE 05/24/2023   BILIRUBINUR NEGATIVE 05/24/2023   KETONESUR NEGATIVE 05/24/2023   PROTEINUR NEGATIVE 05/24/2023   NITRITE POSITIVE (A) 05/24/2023   LEUKOCYTESUR LARGE (A) 05/24/2023   EPIU 0-5 05/24/2023   WBCU >50 05/24/2023   RBCU 6-10 05/24/2023   BACTERIA NONE SEEN 05/24/2023   Clinical Information and Notes:  Jane Willis is scheduled for a COMPUTER ASSISTED TOTAL KNEE ARTHROPLASTY - RNFA (Left: Knee) on 06/04/2023.   [facility] Perioperative Services: Pre-Admission/Anesthesia Testing  Abnormal Lab Notification   Date: 05/24/23  Name: Jane Willis MRN:   [id number]  Re: Abnormal labs noted during PAT appointment   Notified:  Provider Name Provider Role Notification Mode  Francesco Sor, MD Orthopedics (Surgeon) Routed and/or faxed via [facility]   Abnormal Lab Value(s):   Lab Results  Component Value Date   COLORURINE YELLOW (A) 05/24/2023   APPEARANCEUR CLOUDY (A) 05/24/2023   LABSPEC 1.014 05/24/2023   PHURINE 5.0 05/24/2023   GLUCOSEU NEGATIVE 05/24/2023   HGBUR NEGATIVE 05/24/2023   BILIRUBINUR NEGATIVE 05/24/2023   KETONESUR NEGATIVE 05/24/2023   PROTEINUR NEGATIVE 05/24/2023   NITRITE POSITIVE (A) 05/24/2023   LEUKOCYTESUR LARGE (A) 05/24/2023   EPIU 0-5 05/24/2023   WBCU >50 05/24/2023   RBCU 6-10 05/24/2023   BACTERIA NONE SEEN  05/24/2023   Impression and Plan:  Jane Willis with a UA that was (+) for infection. Reflex culture sent and is currently pending. Will plan on forwarding final culture results to MD as they become available to me. Sending results for review and consideration of preoperative treatment as deemed appropriate by Dr. Ernest Pine.   Quentin Mulling, MSN, APRN, FNP-C, CEN [facility]  Peri-operative Services Nurse Practitioner Phone: [phone] Fax: [phone] 05/24/23 3:54 PM  NOTE: This note has been prepared using Dragon dictation software. Despite my best ability to proofread, there is always the potential that unintentional transcriptional errors may still occur from this process.

## 2023-05-28 NOTE — H&P (Signed)
ORTHOPAEDIC HISTORY & PHYSICAL Jane Willis, Adelina Mings., MD - 04/29/2023 9:45 AM EDT Formatting of this note is different from the original. Images from the original note were not included. Chief Complaint: Chief Complaint Patient presents with Left knee degenerative arthrosis  Reason for Visit: The patient is a 82 y.o. female who presents today for reevaluation of her left knee. She has a long history of left knee pain. She localizes most of the pain along the lateral and anterior aspect of the knee. She reports some swelling, no locking, and some giving way of the knee. The pain is aggravated by standing and walking. The knee pain limits the patient's ability to ambulate long distances.The patient has not appreciated any significant improvement despite Tylenol, intraarticular corticosteroid injections, and activity modification. She is not using any ambulatory aids. She had been tentatively scheduled for surgery last year but became apprehensive and canceled the procedure.The patient states that the knee pain has significantly progressed to the point that it is significantly interfering with her activities of daily living.  Medications: Current Outpatient Medications Medication Sig Dispense Refill acetaminophen (TYLENOL) 500 MG tablet Take 500 mg by mouth once as needed for Pain amLODIPine (NORVASC) 10 MG tablet Take 10 mg by mouth once daily ascorbic acid, vitamin C, (VITAMIN C) 500 MG tablet Take 500 mg by mouth once daily aspirin 81 MG EC tablet Take 81 mg by mouth once daily cholecalciferol (VITAMIN D3) 1,000 unit capsule Take 1,000 Units by mouth once daily cyanocobalamin (VITAMIN B12) 1000 MCG tablet Take 1,000 mcg by mouth once daily omega 3-dha-epa-fish oil 100-160-1,000 mg Cap Take 1,000 mg by mouth once daily  No current facility-administered medications for this visit.  Allergies: Allergies Allergen Reactions Gabapentin Other (See Comments) Balance issues  Past Medical  History: Past Medical History: Diagnosis Date History of chicken pox Hypertension Osteoporosis Pre-diabetes  Past Surgical History: Past Surgical History: Procedure Laterality Date HYSTERECTOMY 1986 L4-5 lateral interbody fusion (XLIF) and posterior spinal fusion 12/10/2021 Dr Venetia Night, [facility], [place] ANKLE SURGERY Left COLONOSCOPY 03/09/2012, 12/21/2006 [facility] (Brother): CBF 02/2017; Recall Ltr mailed 01/21/2017 (dw) TONSILLECTOMY  Social History: Social History  Socioeconomic History Marital status: Widowed Number of children: 3 Years of education: 14 Occupational History Occupation: RetiredTherapist, nutritional Tobacco Use Smoking status: Never Smokeless tobacco: Never Vaping Use Vaping status: Never Used Substance and Sexual Activity Alcohol use: No Drug use: No Sexual activity: Not Currently Partners: Male  Social Determinants of Health  Financial Resource Strain: Low Risk (03/11/2023) Received from [facility] Overall Financial Resource Strain (CARDIA) Difficulty of Paying Living Expenses: Not hard at all Food Insecurity: No Food Insecurity (03/11/2023) Received from [facility] Hunger Vital Sign Worried About Running Out of Food in the Last Year: Never true Ran Out of Food in the Last Year: Never true Transportation Needs: No Transportation Needs (03/11/2023) Received from [facility] - Transportation Lack of Transportation (Medical): No Lack of Transportation (Non-Medical): No Physical Activity: Inactive (03/11/2023) Received from [facility] Exercise Vital Sign Days of Exercise per Week: 0 days Minutes of Exercise per Session: 0 min Stress: No Stress Concern Present (03/11/2023) Received from [facility] - Occupational Stress Questionnaire Feeling of Stress : Not at all Social Connections: Moderately Integrated (03/11/2023) Received from [facility] Social Connection and Isolation Panel [NHANES] Frequency  of Communication with Friends and Family: More than three times a week Frequency of Social Gatherings with Friends and Family: Twice a week Attends Religious Services: More than 4  times per year Active Member of Clubs or Organizations: Yes Attends Banker Meetings: More than 4 times per year Marital Status: Widowed  Family History: Family History Problem Relation Name Age of Onset High blood pressure (Hypertension) Mother High blood pressure (Hypertension) Father Colon cancer Brother Tremor Brother Myocardial Infarction (Heart attack) Brother No Known Problems Brother Breast cancer Maternal Aunt  Review of Systems: A comprehensive 14 point ROS was performed, reviewed, and the pertinent orthopaedic findings are documented in the HPI.  Exam BP 128/74  Ht 160 cm (5' 2.99")  Wt 62.1 kg (137 lb)  BMI 24.28 kg/m  General: Well-developed, well-nourished female seen in no acute distress. Antalgic gait. Valgus thrust to the left knee.  HEENT: Atraumatic, normocephalic. Pupils are equal and reactive to light. Extraocular motion is intact. Sclera are clear. Oropharynx is clear with moist mucosa.  Neck: Supple, nontender, and with good ROM. No thyromegaly, adenopathy, JVD, or carotid bruits.  Lungs: Clear to auscultation bilaterally.  Cardiovascular: Regular rate and rhythm. Normal S1, S2. No murmur. No appreciable gallops or rubs. Peripheral pulses are palpable. No lower extremity edema. Homan`s test is negative.  Abdomen: Soft, nontender, nondistended. Bowel sounds are present.  Extremities: Good strength, stability, and range of motion of the upper extremities. Good range of motion of the hips and ankles.  Left Knee: Soft tissue swelling: mild Effusion: minimal Erythema: none Crepitance: mild Tenderness: lateral Alignment: relative valgus Mediolateral laxity: lateral pseudolaxity Posterior sag: negative Patellar tracking: Good tracking without evidence  of subluxation or tilt Atrophy: No significant atrophy. Quadriceps tone was fair to good. Range of motion: 0/3/121 degrees  Neurologic: Awake, alert, and oriented. Sensory function is intact to pinprick and light touch. Motor strength is judged to be 5/5. Motor coordination is within normal limits. No apparent clonus. No tremor.  X-rays: I ordered and interpreted standing AP, lateral, and sunrise radiographs of the left knee that were obtained in the office today. There is significant narrowing of the lateral cartilage space with bone-on-bone articulation and associated valgus alignment. Osteophyte formation is noted. Subchondral sclerosis is noted. No evidence of fracture or dislocation.  Impression: Degenerative arthrosis of the left knee  Plan: The findings were discussed in detail with the patient. The patient was given informational material on total knee replacement. Conservative treatment options were reviewed with the patient. We discussed the risks and benefits of surgical intervention. The usual perioperative course was also discussed in detail. The patient expressed understanding of the risks and benefits of surgical intervention and would like to proceed with plans for left total knee arthroplasty.  Hemoglobin A1c is an indication of glucose control. Uncontrolled diabetes has been associated with perioperative complications including poor wound healing and surgical site infections. To decrease the risk of perioperative complications, hemoglobin A1c must be less than 8.0 prior to surgery. The patient is encouraged to work with her primary care physician and/or endocrinologist to optimize glucose management.  Hemoglobin A1c was 5.6 on 12/04/2022.  I spent a total of 45 minutes in both face-to-face and non-face-to-face activities, excluding procedures performed, for this visit on the date of this encounter.  MEDICAL CLEARANCE: Per anesthesiology. ACTIVITY: As tolerated. WORK  STATUS: Not applicable. THERAPY: Preoperative physical therapy evaluation. MEDICATIONS: Requested Prescriptions  No prescriptions requested or ordered in this encounter  FOLLOW-UP: Return for preop History & Physical pending surgery date.  Jemmie Rhinehart P. Angie Fava., M.D.  This note was generated in part with voice recognition software and I apologize for any typographical errors  that were not detected and corrected.  Electronically signed by Shari Heritage., MD at 05/02/2023 11:35 PM EDT

## 2023-06-03 MED ORDER — ORAL CARE MOUTH RINSE: 15.0000 mL | Freq: Once | OROMUCOSAL | Status: AC

## 2023-06-03 MED ORDER — DEXAMETHASONE SODIUM PHOSPHATE 10 MG/ML IJ SOLN
8.0000 mg | Freq: Once | INTRAMUSCULAR | Status: AC
  Administered 2023-06-04: 8 mg via INTRAVENOUS

## 2023-06-03 MED ORDER — CHLORHEXIDINE GLUCONATE 4 % EX SOLN: 60.0000 mL | Freq: Once | CUTANEOUS | Status: DC

## 2023-06-03 MED ORDER — CEFAZOLIN SODIUM-DEXTROSE 2-4 GM/100ML-% IV SOLN
2.0000 g | INTRAVENOUS | Status: AC
  Administered 2023-06-04: 2 g via INTRAVENOUS

## 2023-06-03 MED ORDER — LACTATED RINGERS IV SOLN: INTRAVENOUS | Status: DC

## 2023-06-03 MED ORDER — CELECOXIB 200 MG PO CAPS
400.0000 mg | ORAL_CAPSULE | Freq: Once | ORAL | Status: AC
  Administered 2023-06-04: 400 mg via ORAL

## 2023-06-03 MED ORDER — CHLORHEXIDINE GLUCONATE 0.12 % MT SOLN
15.0000 mL | Freq: Once | OROMUCOSAL | Status: AC
  Administered 2023-06-04: 15 mL via OROMUCOSAL

## 2023-06-03 MED ORDER — FAMOTIDINE 20 MG PO TABS
20.0000 mg | ORAL_TABLET | Freq: Once | ORAL | Status: AC
  Administered 2023-06-04: 20 mg via ORAL

## 2023-06-04 ENCOUNTER — Ambulatory Visit: Payer: Medicare Other | Admitting: Urgent Care

## 2023-06-04 ENCOUNTER — Encounter
Admission: RE | Disposition: A | Discharge status: Home / Self Care | Payer: Self-pay | Source: Ambulatory Visit | Attending: Orthopedic Surgery

## 2023-06-04 ENCOUNTER — Observation Stay
Admission: RE | Admit: 2023-06-04 | Discharge: 2023-06-05 | Disposition: A | Discharge status: Home / Self Care | Payer: Medicare Other | Source: Ambulatory Visit | Attending: Orthopedic Surgery | Admitting: Orthopedic Surgery

## 2023-06-04 ENCOUNTER — Other Ambulatory Visit: Payer: Self-pay

## 2023-06-04 ENCOUNTER — Encounter: Payer: Self-pay | Admitting: Orthopedic Surgery

## 2023-06-04 ENCOUNTER — Observation Stay: Payer: Medicare Other

## 2023-06-04 ENCOUNTER — Ambulatory Visit: Payer: Medicare Other | Admitting: Certified Registered Nurse Anesthetist

## 2023-06-04 DIAGNOSIS — I10 Essential (primary) hypertension: Secondary | ICD-10-CM | POA: Insufficient documentation

## 2023-06-04 DIAGNOSIS — M1712 Unilateral primary osteoarthritis, left knee: Principal | ICD-10-CM | POA: Insufficient documentation

## 2023-06-04 DIAGNOSIS — E538 Deficiency of other specified B group vitamins: Secondary | ICD-10-CM

## 2023-06-04 DIAGNOSIS — Z79899 Other long term (current) drug therapy: Secondary | ICD-10-CM | POA: Diagnosis not present

## 2023-06-04 DIAGNOSIS — R7303 Prediabetes: Secondary | ICD-10-CM

## 2023-06-04 DIAGNOSIS — Z7982 Long term (current) use of aspirin: Secondary | ICD-10-CM | POA: Diagnosis not present

## 2023-06-04 DIAGNOSIS — Z96659 Presence of unspecified artificial knee joint: Secondary | ICD-10-CM

## 2023-06-04 HISTORY — PX: KNEE ARTHROPLASTY: SHX992

## 2023-06-04 SURGERY — ARTHROPLASTY, KNEE, TOTAL, USING IMAGELESS COMPUTER-ASSISTED NAVIGATION
Anesthesia: Spinal | Site: Knee | Laterality: Left

## 2023-06-04 MED ORDER — DROPERIDOL 2.5 MG/ML IJ SOLN
INTRAMUSCULAR | Status: AC
  Filled 2023-06-04: qty 2

## 2023-06-04 MED ORDER — FENTANYL CITRATE (PF) 100 MCG/2ML IJ SOLN
INTRAMUSCULAR | Status: DC | PRN
  Administered 2023-06-04 (×3): 25 ug via INTRAVENOUS

## 2023-06-04 MED ORDER — PANTOPRAZOLE SODIUM 40 MG PO TBEC
40.0000 mg | DELAYED_RELEASE_TABLET | Freq: Two times a day (BID) | ORAL | Status: DC
  Administered 2023-06-04 – 2023-06-05 (×3): 40 mg via ORAL
  Filled 2023-06-04 (×3): qty 1

## 2023-06-04 MED ORDER — ASPIRIN 81 MG PO CHEW
81.0000 mg | CHEWABLE_TABLET | Freq: Two times a day (BID) | ORAL | Status: DC
  Administered 2023-06-04 – 2023-06-05 (×2): 81 mg via ORAL
  Filled 2023-06-04: qty 1

## 2023-06-04 MED ORDER — CELECOXIB 200 MG PO CAPS
ORAL_CAPSULE | ORAL | Status: AC
  Filled 2023-06-04: qty 1

## 2023-06-04 MED ORDER — HYDROMORPHONE HCL 1 MG/ML IJ SOLN: 0.5000 mg | INTRAMUSCULAR | Status: DC | PRN

## 2023-06-04 MED ORDER — METOCLOPRAMIDE HCL 5 MG PO TABS
10.0000 mg | ORAL_TABLET | Freq: Three times a day (TID) | ORAL | Status: DC
  Administered 2023-06-04 – 2023-06-05 (×3): 10 mg via ORAL
  Filled 2023-06-04 (×3): qty 2

## 2023-06-04 MED ORDER — OXYCODONE HCL 5 MG PO TABS: 5.0000 mg | ORAL_TABLET | ORAL | Status: DC | PRN

## 2023-06-04 MED ORDER — TRANEXAMIC ACID-NACL 1000-0.7 MG/100ML-% IV SOLN
INTRAVENOUS | Status: AC
  Filled 2023-06-04: qty 100

## 2023-06-04 MED ORDER — PROPOFOL 500 MG/50ML IV EMUL
INTRAVENOUS | Status: DC | PRN
  Administered 2023-06-04: 50 ug/kg/min via INTRAVENOUS

## 2023-06-04 MED ORDER — AMLODIPINE BESYLATE 10 MG PO TABS
10.0000 mg | ORAL_TABLET | Freq: Every day | ORAL | Status: DC
  Administered 2023-06-04 – 2023-06-05 (×2): 10 mg via ORAL
  Filled 2023-06-04 (×2): qty 1

## 2023-06-04 MED ORDER — EPHEDRINE SULFATE (PRESSORS) 50 MG/ML IJ SOLN
INTRAMUSCULAR | Status: DC | PRN
  Administered 2023-06-04: 5 mg via INTRAVENOUS

## 2023-06-04 MED ORDER — DIPHENHYDRAMINE HCL 12.5 MG/5ML PO ELIX: 12.5000 mg | ORAL_SOLUTION | ORAL | Status: DC | PRN

## 2023-06-04 MED ORDER — OXYCODONE HCL 5 MG PO TABS
10.0000 mg | ORAL_TABLET | ORAL | Status: DC | PRN
  Administered 2023-06-04: 10 mg via ORAL
  Filled 2023-06-04: qty 2

## 2023-06-04 MED ORDER — ONDANSETRON HCL 4 MG/2ML IJ SOLN
INTRAMUSCULAR | Status: DC | PRN
  Administered 2023-06-04: 4 mg via INTRAVENOUS

## 2023-06-04 MED ORDER — TRANEXAMIC ACID-NACL 1000-0.7 MG/100ML-% IV SOLN
1000.0000 mg | INTRAVENOUS | Status: AC
  Administered 2023-06-04: 1000 mg via INTRAVENOUS

## 2023-06-04 MED ORDER — DEXMEDETOMIDINE HCL IN NACL 80 MCG/20ML IV SOLN
INTRAVENOUS | Status: AC
  Filled 2023-06-04: qty 20

## 2023-06-04 MED ORDER — ONDANSETRON HCL 4 MG PO TABS
4.0000 mg | ORAL_TABLET | Freq: Four times a day (QID) | ORAL | Status: DC | PRN
  Administered 2023-06-04: 4 mg via ORAL

## 2023-06-04 MED ORDER — OXYCODONE HCL 5 MG/5ML PO SOLN: 5.0000 mg | Freq: Once | ORAL | Status: DC | PRN

## 2023-06-04 MED ORDER — GLYCOPYRROLATE 0.2 MG/ML IJ SOLN
INTRAMUSCULAR | Status: AC
  Filled 2023-06-04: qty 1

## 2023-06-04 MED ORDER — PROPOFOL 1000 MG/100ML IV EMUL
INTRAVENOUS | Status: AC
  Filled 2023-06-04: qty 100

## 2023-06-04 MED ORDER — PHENOL 1.4 % MT LIQD: 1.0000 | OROMUCOSAL | Status: DC | PRN

## 2023-06-04 MED ORDER — SENNOSIDES-DOCUSATE SODIUM 8.6-50 MG PO TABS
1.0000 | ORAL_TABLET | Freq: Two times a day (BID) | ORAL | Status: DC
  Administered 2023-06-05: 1 via ORAL
  Filled 2023-06-04 (×2): qty 1

## 2023-06-04 MED ORDER — ACETAMINOPHEN 325 MG PO TABS: 325.0000 mg | ORAL_TABLET | Freq: Four times a day (QID) | ORAL | Status: DC | PRN

## 2023-06-04 MED ORDER — POLYVINYL ALCOHOL 1.4 % OP SOLN: 1.0000 [drp] | Freq: Every day | OPHTHALMIC | Status: DC | PRN

## 2023-06-04 MED ORDER — SURGIPHOR WOUND IRRIGATION SYSTEM - OPTIME: TOPICAL | Status: DC | PRN

## 2023-06-04 MED ORDER — BISACODYL 10 MG RE SUPP: 10.0000 mg | Freq: Every day | RECTAL | Status: DC | PRN

## 2023-06-04 MED ORDER — TRANEXAMIC ACID-NACL 1000-0.7 MG/100ML-% IV SOLN
1000.0000 mg | Freq: Once | INTRAVENOUS | Status: AC
  Administered 2023-06-04: 1000 mg via INTRAVENOUS

## 2023-06-04 MED ORDER — DEXAMETHASONE SODIUM PHOSPHATE 10 MG/ML IJ SOLN
INTRAMUSCULAR | Status: AC
  Filled 2023-06-04: qty 1

## 2023-06-04 MED ORDER — MENTHOL 3 MG MT LOZG: 1.0000 | LOZENGE | OROMUCOSAL | Status: DC | PRN

## 2023-06-04 MED ORDER — ONDANSETRON HCL 4 MG/2ML IJ SOLN
INTRAMUSCULAR | Status: AC
  Filled 2023-06-04: qty 2

## 2023-06-04 MED ORDER — AMLODIPINE BESYLATE-VALSARTAN 10-160 MG PO TABS: 1.0000 | ORAL_TABLET | Freq: Every day | ORAL | Status: DC

## 2023-06-04 MED ORDER — ACETAMINOPHEN 10 MG/ML IV SOLN
1000.0000 mg | Freq: Four times a day (QID) | INTRAVENOUS | Status: AC
  Administered 2023-06-04 – 2023-06-05 (×3): 1000 mg via INTRAVENOUS
  Filled 2023-06-04 (×3): qty 100

## 2023-06-04 MED ORDER — IRBESARTAN 150 MG PO TABS
150.0000 mg | ORAL_TABLET | Freq: Every day | ORAL | Status: DC
  Administered 2023-06-04 – 2023-06-05 (×2): 150 mg via ORAL
  Filled 2023-06-04 (×2): qty 1

## 2023-06-04 MED ORDER — PHENYLEPHRINE HCL-NACL 20-0.9 MG/250ML-% IV SOLN
INTRAVENOUS | Status: DC | PRN
Start: 2023-06-04 — End: 2023-06-04
  Administered 2023-06-04: 25 ug/min via INTRAVENOUS

## 2023-06-04 MED ORDER — FENTANYL CITRATE (PF) 100 MCG/2ML IJ SOLN
INTRAMUSCULAR | Status: AC
  Filled 2023-06-04: qty 2

## 2023-06-04 MED ORDER — ALUM & MAG HYDROXIDE-SIMETH 200-200-20 MG/5ML PO SUSP: 30.0000 mL | ORAL | Status: DC | PRN

## 2023-06-04 MED ORDER — BUPIVACAINE HCL (PF) 0.25 % IJ SOLN
INTRAMUSCULAR | Status: AC
  Filled 2023-06-04: qty 60

## 2023-06-04 MED ORDER — TRAMADOL HCL 50 MG PO TABS: 50.0000 mg | ORAL_TABLET | ORAL | Status: DC | PRN

## 2023-06-04 MED ORDER — FAMOTIDINE 20 MG PO TABS
ORAL_TABLET | ORAL | Status: AC
  Filled 2023-06-04: qty 1

## 2023-06-04 MED ORDER — PHENYLEPHRINE HCL-NACL 20-0.9 MG/250ML-% IV SOLN
INTRAVENOUS | Status: AC
  Filled 2023-06-04: qty 250

## 2023-06-04 MED ORDER — DEXMEDETOMIDINE HCL IN NACL 80 MCG/20ML IV SOLN
INTRAVENOUS | Status: DC | PRN
  Administered 2023-06-04: 4 ug via INTRAVENOUS

## 2023-06-04 MED ORDER — FENTANYL CITRATE (PF) 100 MCG/2ML IJ SOLN
25.0000 ug | INTRAMUSCULAR | Status: DC | PRN
  Administered 2023-06-04: 25 ug via INTRAVENOUS
  Administered 2023-06-04: 50 ug via INTRAVENOUS
  Administered 2023-06-04: 25 ug via INTRAVENOUS

## 2023-06-04 MED ORDER — BUPIVACAINE HCL (PF) 0.5 % IJ SOLN
INTRAMUSCULAR | Status: DC | PRN
  Administered 2023-06-04: 2.8 mL

## 2023-06-04 MED ORDER — ENSURE PRE-SURGERY PO LIQD
296.0000 mL | Freq: Once | ORAL | Status: AC
  Administered 2023-06-04: 296 mL via ORAL
  Filled 2023-06-04: qty 296

## 2023-06-04 MED ORDER — SODIUM CHLORIDE (PF) 0.9 % IJ SOLN
INTRAMUSCULAR | Status: DC | PRN
  Administered 2023-06-04: 120 mL via INTRAMUSCULAR

## 2023-06-04 MED ORDER — FERROUS SULFATE 325 (65 FE) MG PO TABS
325.0000 mg | ORAL_TABLET | Freq: Two times a day (BID) | ORAL | Status: DC
  Administered 2023-06-05: 325 mg via ORAL
  Filled 2023-06-04: qty 1

## 2023-06-04 MED ORDER — PSYLLIUM 95 % PO PACK
1.0000 | PACK | Freq: Every day | ORAL | Status: DC
  Administered 2023-06-05: 1 via ORAL
  Filled 2023-06-04 (×2): qty 1

## 2023-06-04 MED ORDER — OXYCODONE HCL 5 MG PO TABS
ORAL_TABLET | ORAL | Status: AC
  Filled 2023-06-04: qty 1

## 2023-06-04 MED ORDER — SODIUM CHLORIDE 0.9 % IR SOLN
Status: DC | PRN
  Administered 2023-06-04: 3000 mL

## 2023-06-04 MED ORDER — FLEET ENEMA RE ENEM: 1.0000 | ENEMA | Freq: Once | RECTAL | Status: DC | PRN

## 2023-06-04 MED ORDER — MAGNESIUM HYDROXIDE 400 MG/5ML PO SUSP
30.0000 mL | Freq: Every day | ORAL | Status: DC
  Administered 2023-06-05: 30 mL via ORAL
  Filled 2023-06-04: qty 30

## 2023-06-04 MED ORDER — ACETAMINOPHEN 10 MG/ML IV SOLN
INTRAVENOUS | Status: DC | PRN
  Administered 2023-06-04: 1000 mg via INTRAVENOUS

## 2023-06-04 MED ORDER — SODIUM CHLORIDE FLUSH 0.9 % IV SOLN
INTRAVENOUS | Status: AC
  Filled 2023-06-04: qty 20

## 2023-06-04 MED ORDER — CEFAZOLIN SODIUM-DEXTROSE 2-4 GM/100ML-% IV SOLN
2.0000 g | Freq: Four times a day (QID) | INTRAVENOUS | Status: AC
  Administered 2023-06-04 (×2): 2 g via INTRAVENOUS
  Filled 2023-06-04 (×2): qty 100

## 2023-06-04 MED ORDER — CEFAZOLIN SODIUM-DEXTROSE 2-4 GM/100ML-% IV SOLN
INTRAVENOUS | Status: AC
  Filled 2023-06-04: qty 100

## 2023-06-04 MED ORDER — OXYCODONE HCL 5 MG PO TABS: 5.0000 mg | ORAL_TABLET | Freq: Once | ORAL | Status: DC | PRN

## 2023-06-04 MED ORDER — PROPOFOL 10 MG/ML IV BOLUS
INTRAVENOUS | Status: DC | PRN
  Administered 2023-06-04: 20 mg via INTRAVENOUS
  Administered 2023-06-04 (×2): 10 mg via INTRAVENOUS

## 2023-06-04 MED ORDER — BUPIVACAINE HCL (PF) 0.5 % IJ SOLN
INTRAMUSCULAR | Status: AC
  Filled 2023-06-04: qty 10

## 2023-06-04 MED ORDER — CHLORHEXIDINE GLUCONATE 0.12 % MT SOLN
OROMUCOSAL | Status: AC
  Filled 2023-06-04: qty 15

## 2023-06-04 MED ORDER — DROPERIDOL 2.5 MG/ML IJ SOLN
0.6250 mg | Freq: Once | INTRAMUSCULAR | Status: AC
  Administered 2023-06-04: 0.625 mg via INTRAVENOUS

## 2023-06-04 MED ORDER — CELECOXIB 200 MG PO CAPS
200.0000 mg | ORAL_CAPSULE | Freq: Two times a day (BID) | ORAL | Status: DC
  Administered 2023-06-04 – 2023-06-05 (×3): 200 mg via ORAL
  Filled 2023-06-04 (×3): qty 1

## 2023-06-04 MED ORDER — ONDANSETRON HCL 4 MG/2ML IJ SOLN
4.0000 mg | Freq: Four times a day (QID) | INTRAMUSCULAR | Status: DC | PRN
  Administered 2023-06-04: 4 mg via INTRAVENOUS
  Filled 2023-06-04 (×2): qty 2

## 2023-06-04 MED ORDER — SODIUM CHLORIDE 0.9 % IV SOLN: INTRAVENOUS | Status: DC

## 2023-06-04 MED ORDER — BUPIVACAINE LIPOSOME 1.3 % IJ SUSP
INTRAMUSCULAR | Status: AC
  Filled 2023-06-04: qty 20

## 2023-06-04 SURGICAL SUPPLY — 79 items
ATTUNE MED DOME PAT 38 KNEE (Knees) IMPLANT
ATTUNE PS FEM LT SZ 5 CEM KNEE (Femur) IMPLANT
ATTUNE PSRP INSE SZ5 7 KNEE (Insert) IMPLANT
BASE TIBIAL ROT PLAT SZ 5 KNEE (Knees) IMPLANT
BATTERY INSTRU NAVIGATION (MISCELLANEOUS) ×4 IMPLANT
BIT DRILL QUICK REL 1/8 2PK SL (BIT) ×3 IMPLANT
BLADE CLIPPER SURG (BLADE) IMPLANT
BLADE SAW 70X12.5 (BLADE) ×1 IMPLANT
BLADE SAW 90X13X1.19 OSCILLAT (BLADE) ×1 IMPLANT
BLADE SAW 90X25X1.19 OSCILLAT (BLADE) ×1 IMPLANT
BONE CEMENT GENTAMICIN (Cement) ×2 IMPLANT
BRUSH SCRUB EZ PLAIN DRY (MISCELLANEOUS) ×1 IMPLANT
BSPLAT TIB 5 CMNT ROT PLAT STR (Knees) ×1 IMPLANT
BTRY SRG DRVR LF (MISCELLANEOUS) ×4
CEMENT BONE GENTAMICIN 40 (Cement) IMPLANT
CEMENT HV SMART SET (Cement) IMPLANT
COOLER POLAR GLACIER W/PUMP (MISCELLANEOUS) ×1 IMPLANT
CUFF TOURN SGL QUICK 24 (TOURNIQUET CUFF) ×1
CUFF TOURN SGL QUICK 30 (TOURNIQUET CUFF)
CUFF TRNQT CYL 24X4X16.5-23 (TOURNIQUET CUFF) IMPLANT
CUFF TRNQT CYL 30X4X21-28X (TOURNIQUET CUFF) IMPLANT
DRAPE INCISE IOBAN 66X45 STRL (DRAPES) IMPLANT
DRAPE SHEET LG 3/4 BI-LAMINATE (DRAPES) ×1 IMPLANT
DRSG AQUACEL AG ADV 3.5X14 (GAUZE/BANDAGES/DRESSINGS) ×1 IMPLANT
DRSG DERMACEA NONADH 3X8 (GAUZE/BANDAGES/DRESSINGS) ×1 IMPLANT
DRSG MEPILEX SACRM 8.7X9.8 (GAUZE/BANDAGES/DRESSINGS) ×1 IMPLANT
DRSG TEGADERM 4X4.75 (GAUZE/BANDAGES/DRESSINGS) ×1 IMPLANT
DURAPREP 26ML APPLICATOR (WOUND CARE) ×2 IMPLANT
ELECT CAUTERY BLADE 6.4 (BLADE) ×1 IMPLANT
ELECT REM PT RETURN 9FT ADLT (ELECTROSURGICAL) ×1
ELECTRODE REM PT RTRN 9FT ADLT (ELECTROSURGICAL) ×1 IMPLANT
EX-PIN ORTHOLOCK NAV 4X150 (PIN) ×2 IMPLANT
GLOVE BIOGEL M STRL SZ7.5 (GLOVE) ×4 IMPLANT
GLOVE SRG 8 PF TXTR STRL LF DI (GLOVE) ×2 IMPLANT
GLOVE SURG UNDER POLY LF SZ8 (GLOVE) ×2
GOWN STRL REUS W/ TWL LRG LVL3 (GOWN DISPOSABLE) ×1 IMPLANT
GOWN STRL REUS W/ TWL XL LVL3 (GOWN DISPOSABLE) ×1 IMPLANT
GOWN STRL REUS W/TWL LRG LVL3 (GOWN DISPOSABLE) ×1
GOWN STRL REUS W/TWL XL LVL3 (GOWN DISPOSABLE) ×1
GOWN TOGA ZIPPER T7+ PEEL AWAY (MISCELLANEOUS) ×1 IMPLANT
HANDLE YANKAUER SUCT OPEN TIP (MISCELLANEOUS) ×1 IMPLANT
HEMOVAC 400CC 10FR (MISCELLANEOUS) ×1 IMPLANT
HOLDER FOLEY CATH W/STRAP (MISCELLANEOUS) ×1 IMPLANT
HOOD PEEL AWAY T7 (MISCELLANEOUS) ×1 IMPLANT
IV NS IRRIG 3000ML ARTHROMATIC (IV SOLUTION) ×1 IMPLANT
KIT TURNOVER KIT A (KITS) ×1 IMPLANT
KNIFE SCULPS 14X20 (INSTRUMENTS) ×1 IMPLANT
MANIFOLD NEPTUNE II (INSTRUMENTS) ×2 IMPLANT
NDL SPNL 20GX3.5 QUINCKE YW (NEEDLE) ×2 IMPLANT
NEEDLE SPNL 20GX3.5 QUINCKE YW (NEEDLE) ×2 IMPLANT
PACK TOTAL KNEE (MISCELLANEOUS) ×1 IMPLANT
PAD ABD DERMACEA PRESS 5X9 (GAUZE/BANDAGES/DRESSINGS) ×2 IMPLANT
PAD ARMBOARD 7.5X6 YLW CONV (MISCELLANEOUS) ×3 IMPLANT
PAD WRAPON POLAR KNEE (MISCELLANEOUS) ×1 IMPLANT
PENCIL SMOKE EVACUATOR COATED (MISCELLANEOUS) ×1 IMPLANT
PIN DRILL FIX HALF THREAD (BIT) ×2 IMPLANT
PIN FIXATION 1/8DIA X 3INL (PIN) ×1 IMPLANT
PULSAVAC PLUS IRRIG FAN TIP (DISPOSABLE) ×1
SOL PREP PVP 2OZ (MISCELLANEOUS) ×1
SOLUTION IRRIG SURGIPHOR (IV SOLUTION) ×1 IMPLANT
SOLUTION PREP PVP 2OZ (MISCELLANEOUS) ×1 IMPLANT
SPONGE DRAIN TRACH 4X4 STRL 2S (GAUZE/BANDAGES/DRESSINGS) ×1 IMPLANT
STAPLER SKIN PROX 35W (STAPLE) ×1 IMPLANT
STOCKINETTE IMPERV 14X48 (MISCELLANEOUS) ×1 IMPLANT
STRAP TIBIA SHORT (MISCELLANEOUS) ×1 IMPLANT
SUCTION TUBE FRAZIER 10FR DISP (SUCTIONS) ×1 IMPLANT
SUT VIC AB 0 CT1 36 (SUTURE) ×1 IMPLANT
SUT VIC AB 1 CT1 36 (SUTURE) ×2 IMPLANT
SUT VIC AB 2-0 CT2 27 (SUTURE) ×1 IMPLANT
SYR 30ML LL (SYRINGE) ×2 IMPLANT
TIBIAL BASE ROT PLAT SZ 5 KNEE (Knees) ×1 IMPLANT
TIP FAN IRRIG PULSAVAC PLUS (DISPOSABLE) ×1 IMPLANT
TOWEL OR 17X26 4PK STRL BLUE (TOWEL DISPOSABLE) IMPLANT
TOWER CARTRIDGE SMART MIX (DISPOSABLE) ×1 IMPLANT
TRAP FLUID SMOKE EVACUATOR (MISCELLANEOUS) ×1 IMPLANT
TRAY FOLEY MTR SLVR 16FR STAT (SET/KITS/TRAYS/PACK) ×1 IMPLANT
TUBING CONNECTING 10 (TUBING) ×2 IMPLANT
WATER STERILE IRR 1000ML POUR (IV SOLUTION) ×1 IMPLANT
WRAPON POLAR PAD KNEE (MISCELLANEOUS) ×1

## 2023-06-04 NOTE — Plan of Care (Signed)
Pt denied nausea and vomiting for first few hours of the shift. Her fluids were therefor discontinued around 10 pm. Pt had clear emesis after her 10 pm med and then again after she ambulated to restroom (both emesis were clear with no blood noted, small amount). Zofran was given and fluids were restarted. Pt verbalized understanding to contact RN if she has another episode of emesis.  Problem: Education: Goal: Knowledge of the prescribed therapeutic regimen will improve Outcome: Progressing   Problem: Activity: Goal: Ability to avoid complications of mobility impairment will improve Outcome: Progressing   Problem: Pain Management: Goal: Pain level will decrease with appropriate interventions Outcome: Progressing   Problem: Skin Integrity: Goal: Will show signs of wound healing Outcome: Progressing   Problem: Health Behavior/Discharge Planning: Goal: Ability to manage health-related needs will improve Outcome: Progressing   Problem: Skin Integrity: Goal: Risk for impaired skin integrity will decrease Outcome: Progressing   Problem: Nutrition: Goal: Adequate nutrition will be maintained Outcome: Not Progressing

## 2023-06-04 NOTE — Evaluation (Signed)
Occupational Therapy Evaluation Patient Details Name: Jane Willis MRN: [id number] DOB: Jun 09, 1941 Today's Date: 06/04/2023   History of Present Illness Pt is an 82 y.o. female s/p elective L TKA 06/04/23.   Clinical Impression   Pt seen for OT evaluation this date, POD#0 from above surgery. Pt was independent in all ADL and IADL prior to surgery, active in her community including volunteering and church. Pt is eager to return to PLOF with less pain and improved safety and independence. Pt currently requires minimal assist for LB dressing while in seated position due to pain and limited AROM of L knee, CGA for toilet transfers, CGA for short distance mobility with RW and PRN VC for RW mgt, and MAX A for compression stocking mgt and polar care mgt. Pt educated in role of OT in acute setting, falls prevention, home/routines modifications, RW mgt, AE/DME for LB ADL tasks, compression stocking mgt, polar care mgt, and LLE positioning to maximize knee extension while at rest. Pt verbalized understanding. Handout provided to support recall and carryover. Pt would benefit from skilled OT services while hospitalized including additional instruction in dressing techniques with or without assistive devices for dressing and bathing skills to support recall and carryover prior to discharge and ultimately to maximize safety, independence, and minimize falls risk and caregiver burden.     If plan is discharge home, recommend the following: A little help with walking and/or transfers;A little help with bathing/dressing/bathroom;Assistance with cooking/housework;Assist for transportation;Help with stairs or ramp for entrance    Functional Status Assessment  Patient has had a recent decline in their functional status and demonstrates the ability to make significant improvements in function in a reasonable and predictable amount of time.  Equipment Recommendations  BSC/3in1;Other (comment) (2WW)     Recommendations for Other Services       Precautions / Restrictions Precautions Precautions: Knee;Fall Precaution Booklet Issued: Yes (comment) Restrictions Weight Bearing Restrictions: Yes LLE Weight Bearing: Weight bearing as tolerated      Mobility Bed Mobility               General bed mobility comments: NT, in recliner at start and end of session    Transfers Overall transfer level: Needs assistance Equipment used: Rolling walker (2 wheels) Transfers: Sit to/from Stand Sit to Stand: Contact guard assist           General transfer comment: VC's for safe hand placement      Balance Overall balance assessment: Needs assistance Sitting-balance support: No upper extremity supported, Feet supported Sitting balance-Leahy Scale: Fair     Standing balance support: Single extremity supported, No upper extremity supported, During functional activity Standing balance-Leahy Scale: Fair Standing balance comment: static standing without UE support for clothing mgt before and after toileting without overt LOB                           ADL either performed or assessed with clinical judgement   ADL Overall ADL's : Needs assistance/impaired                         Toilet Transfer: Ambulation;Comfort height toilet;Rolling walker (2 wheels);Contact guard assist   Toileting- Clothing Manipulation and Hygiene: Supervision/safety;Sitting/lateral lean       Functional mobility during ADLs: Contact guard assist;Rolling walker (2 wheels);Cueing for sequencing       Vision         Perception  Praxis         Pertinent Vitals/Pain Pain Assessment Pain Assessment: 0-10 Pain Score: 6  Pain Location: L knee Pain Descriptors / Indicators: Aching, Discomfort Pain Intervention(s): Limited activity within patient's tolerance, Monitored during session, Premedicated before session, Repositioned, Ice applied     Extremity/Trunk Assessment  Upper Extremity Assessment Upper Extremity Assessment: Overall WFL for tasks assessed   Lower Extremity Assessment Lower Extremity Assessment: Defer to PT evaluation;Generalized weakness;LLE deficits/detail LLE Deficits / Details: L TKA LLE Sensation: WNL       Communication Communication Communication: No apparent difficulties Cueing Techniques: Verbal cues;Tactile cues   Cognition Arousal: Alert Behavior During Therapy: WFL for tasks assessed/performed Overall Cognitive Status: Within Functional Limits for tasks assessed                                       General Comments       Exercises Other Exercises Other Exercises: Pt educated in role of OT in acute setting, falls prevention, home/routines modifications, RW mgt, AE/DME for LB ADL tasks, compression stocking mgt, polar care mgt, and LLE positioning to maximize knee extension while at rest   Shoulder Instructions      Home Living Family/patient expects to be discharged to:: Private residence Living Arrangements: Alone Available Help at Discharge: Family;Available 24 hours/day Type of Home: House Home Access: Stairs to enter [employer] of Steps: 5 Entrance Stairs-Rails: Right;Left;Can reach both Home Layout: One level;Laundry or work area in basement     [employer] Shower/Tub: Chief Strategy Officer: Handicapped height     Home Equipment: Mudlogger: Educational psychologist Comments: Pt has a bidet      Prior Functioning/Environment Prior Level of Function : Independent/Modified Independent;Driving               ADLs Comments: independent and active, volunteers, involved in church        OT Problem List: Decreased strength;Decreased range of motion;Pain;Impaired balance (sitting and/or standing);Decreased knowledge of use of DME or AE      OT Treatment/Interventions: Self-care/ADL training;Therapeutic exercise;Therapeutic activities;DME  and/or AE instruction;Patient/family education;Balance training    OT Goals(Current goals can be found in the care plan section) Acute Rehab OT Goals Patient Stated Goal: be as independent as possible OT Goal Formulation: With patient Time For Goal Achievement: 06/18/23 Potential to Achieve Goals: Good ADL Goals Pt Will Perform Lower Body Dressing: with adaptive equipment;sit to/from stand;with contact guard assist Pt Will Transfer to Toilet: with modified independence;ambulating (LRAD) Pt Will Perform Toileting - Clothing Manipulation and hygiene: with modified independence Additional ADL Goal #1: Pt will independently instruct family/caregiver in polar care mgt. Additional ADL Goal #2: Pt will independently instruct family/caregiver in compression stocking mgt.  OT Frequency: Min 1X/week    Co-evaluation              AM-PAC OT "6 Clicks" Daily Activity     Outcome Measure Help from another person eating meals?: None Help from another person taking care of personal grooming?: None Help from another person toileting, which includes using toliet, bedpan, or urinal?: A Little Help from another person bathing (including washing, rinsing, drying)?: A Little Help from another person to put on and taking off regular upper body clothing?: None Help from another person to put on and taking off regular lower body clothing?: A Little 6 Click Score: 21   End of Session  Equipment Utilized During Treatment: Rolling walker (2 wheels)  Activity Tolerance: Patient tolerated treatment well Patient left: in chair;with call bell/phone within reach;Other (comment) (polar care in place, rolled towel under L ankle)  OT Visit Diagnosis: Other abnormalities of gait and mobility (R26.89);Pain Pain - Right/Left: Left Pain - part of body: Knee                Time: 1610-9604 OT Time Calculation (min): 32 min Charges:  OT General Charges $OT Visit: 1 Visit OT Evaluation $OT Eval Low Complexity: 1  Low OT Treatments $Self Care/Home Management : 23-37 mins  Arman Filter., MPH, MS, OTR/L ascom [phone] 06/04/23, 4:55 PM

## 2023-06-04 NOTE — Anesthesia Procedure Notes (Signed)
Spinal  Patient location during procedure: OR Start time: 06/04/2023 7:15 AM End time: 06/04/2023 7:30 AM Reason for block: surgical anesthesia Staffing Performed: resident/CRNA  Resident/CRNA: Malva Cogan, CRNA Performed by: Malva Cogan, CRNA Authorized by: Louie Boston, MD   Preanesthetic Checklist Completed: patient identified, IV checked, site marked, risks and benefits discussed, surgical consent, monitors and equipment checked, pre-op evaluation and timeout performed Spinal Block Patient position: sitting Prep: ChloraPrep Patient monitoring: heart rate, continuous pulse ox, blood pressure and cardiac monitor Approach: midline Location: L4-5 Injection technique: single-shot Needle Needle type: Whitacre and Introducer  Needle gauge: 25 G Needle length: 9 cm Assessment Sensory level: T10 Events: CSF return Additional Notes Sterile aseptic technique used throughout the procedure.  Negative paresthesia. Negative blood return. Positive free-flowing CSF. Expiration date of kit checked and confirmed. Patient tolerated procedure well, without complications.

## 2023-06-04 NOTE — Anesthesia Preprocedure Evaluation (Addendum)
Anesthesia Evaluation  Patient identified by MRN, date of birth, ID band Patient awake    Reviewed: Allergy & Precautions, NPO status , Patient's Chart, lab work & pertinent test results  History of Anesthesia Complications (+) PONV and history of anesthetic complications  Airway Mallampati: III  TM Distance: >3 FB Neck ROM: full    Dental no notable dental hx.    Pulmonary neg pulmonary ROS   Pulmonary exam normal        Cardiovascular hypertension, Normal cardiovascular exam     Neuro/Psych  Headaches  negative psych ROS   GI/Hepatic Neg liver ROS,GERD  Controlled,,  Endo/Other  negative endocrine ROS    Renal/GU      Musculoskeletal  (+) Arthritis ,    Abdominal   Peds  Hematology negative hematology ROS (+)   Anesthesia Other Findings Past Medical History: No date: Allergy     Comment:  Seasonal No date: Arthritis No date: Chicken pox No date: Hyperlipidemia No date: Hypertension No date: PONV (postoperative nausea and vomiting) No date: Urinary incontinence  Past Surgical History: 1986: ABDOMINAL HYSTERECTOMY 12/10/2021: ANTERIOR LATERAL LUMBAR FUSION WITH PERCUTANEOUS SCREW 1  LEVEL; N/A     Comment:  Procedure: L4-5 LATERAL INTERBODY FUSION WITH POSTERIOR               PERCUTANEOUS FIXATION;  Surgeon: Venetia Night, MD;               Location: [facility] ORS;  Service: Neurosurgery;  Laterality:               N/A; 12/10/2021: APPLICATION OF INTRAOPERATIVE CT SCAN; N/A     Comment:  Procedure: APPLICATION OF INTRAOPERATIVE CT SCAN;                Surgeon: Venetia Night, MD;  Location: [facility] ORS;                Service: Neurosurgery;  Laterality: N/A; 1970's: BREAST EXCISIONAL BIOPSY; Left     Comment:  benign 1968: BREAST SURGERY 2014/2015: CATARACT EXTRACTION     Comment:  Both eyes No date: EYE SURGERY 01/2009: FRACTURE SURGERY     Comment:  Fractured Left ankle, plate on outside of ankle  2 rods               through/across ankle from inside of ankle No date: OOPHORECTOMY 1960: TONSILLECTOMY AND ADENOIDECTOMY  BMI    Body Mass Index: 24.69 kg/m      Reproductive/Obstetrics negative OB ROS                              Anesthesia Physical Anesthesia Plan  ASA: 2  Anesthesia Plan: Spinal   Post-op Pain Management: Regional block*, Toradol IV (intra-op)* and Ofirmev IV (intra-op)*   Induction:   PONV Risk Score and Plan: 3 and Propofol infusion, TIVA and Treatment may vary due to age or medical condition  Airway Management Planned: Natural Airway and Nasal Cannula  Additional Equipment:   Intra-op Plan:   Post-operative Plan:   Informed Consent: I have reviewed the patients History and Physical, chart, labs and discussed the procedure including the risks, benefits and alternatives for the proposed anesthesia with the patient or authorized representative who has indicated his/her understanding and acceptance.     Dental Advisory Given  Plan Discussed with: Anesthesiologist, CRNA and Surgeon  Anesthesia Plan Comments: (Patient reports no bleeding problems and no anticoagulant use.  Plan  for spinal with backup GA  Patient consented for risks of anesthesia including but not limited to:  - adverse reactions to medications - damage to eyes, teeth, lips or other oral mucosa - nerve damage due to positioning  - risk of bleeding, infection and or nerve damage from spinal that could lead to paralysis - risk of headache or failed spinal - damage to teeth, lips or other oral mucosa - sore throat or hoarseness - damage to heart, brain, nerves, lungs, other parts of body or loss of life  Patient voiced understanding.)         Anesthesia Quick Evaluation

## 2023-06-04 NOTE — Anesthesia Procedure Notes (Signed)
Date/Time: 06/04/2023 7:42 AM  Performed by: Malva Cogan, CRNAPre-anesthesia Checklist: Patient identified, Emergency Drugs available, Suction available, Patient being monitored and Timeout performed Patient Re-evaluated:Patient Re-evaluated prior to induction Oxygen Delivery Method: Simple face mask Induction Type: IV induction Placement Confirmation: CO2 detector and positive ETCO2

## 2023-06-04 NOTE — Progress Notes (Signed)
Patient is not able to walk the distance required to go the bathroom, or he/she is unable to safely negotiate stairs required to access the bathroom.  A 3in1 BSC will alleviate this problem   James P. Hooten, Jr. M.D.  

## 2023-06-04 NOTE — Evaluation (Signed)
Physical Therapy Evaluation Patient Details Name: Jane Willis MRN: [id number] DOB: 1941/08/04 Today's Date: 06/04/2023  History of Present Illness  Pt is an 82 y.o. female s/p elective L TKA 06/04/23.  Clinical Impression  Pt admitted with above diagnosis. Pt currently with functional limitations due to the deficits listed below (see PT Problem List). Pt received upright in bed agreeable to PT eval. Pt reports PTA she was independent with gait and ADL's/IADL's. Has excellent 24/7 family support in homes to d/c home after admission.   To date, pt required supervision for bed mobility without need for UE use on bed rails. Upon sitting pt with N/V with mild dizziness upon sitting. After N/V dizziness subsides. Pt is able to stand and ambulate to bathroom 20' using RW with VC's needed for hand placement throughout for safe hand placement but completes urination independently. Pt able to ambulate with antalgic gait with limited knee flexion in stance phase on LLE despite VC's. Pt safely in recliner with LE's elevated and all needs in place. Educated pt on Hep exercises and reviewed safe LE placement to prevent flexion contracture. Anticipate pending gait and stair training and progression, pt will be safe to d/c home with [facility] PT services and needed DME.       If plan is discharge home, recommend the following: A little help with walking and/or transfers;Assistance with cooking/housework;Help with stairs or ramp for entrance;Assist for transportation   Can travel by private vehicle        Equipment Recommendations Rolling walker (2 wheels);BSC/3in1  Recommendations for Other Services       Functional Status Assessment Patient has had a recent decline in their functional status and demonstrates the ability to make significant improvements in function in a reasonable and predictable amount of time.     Precautions / Restrictions Precautions Precautions: Knee;Fall Precaution Booklet Issued:  Yes (comment) Restrictions Weight Bearing Restrictions: Yes LLE Weight Bearing: Weight bearing as tolerated      Mobility  Bed Mobility Overal bed mobility: Modified Independent               Patient Response: Cooperative  Transfers Overall transfer level: Needs assistance Equipment used: Rolling walker (2 wheels) Transfers: Sit to/from Stand Sit to Stand: Contact guard assist           General transfer comment: VC's for safe hand placement    Ambulation/Gait Ambulation/Gait assistance: Contact guard assist Gait Distance (Feet): 20 Feet Assistive device: Rolling walker (2 wheels) Gait Pattern/deviations: Step-to pattern, Decreased stance time - left, Decreased dorsiflexion - left, Decreased weight shift to left       General Gait Details: Generally abducted LLE gait with RW. Fair foot clearance but limited knee flexion in stance.  Stairs            Wheelchair Mobility     Tilt Bed Tilt Bed Patient Response: Cooperative  Modified Rankin (Stroke Patients Only)       Balance Overall balance assessment: Needs assistance Sitting-balance support: Bilateral upper extremity supported, Feet supported Sitting balance-Leahy Scale: Fair     Standing balance support: Bilateral upper extremity supported, Single extremity supported Standing balance-Leahy Scale: Fair Standing balance comment: B/SUE support on RW                             Pertinent Vitals/Pain Pain Assessment Pain Assessment: Faces Faces Pain Scale: Hurts a little bit Pain Location: L knee Pain Descriptors / Indicators: Aching,  Discomfort Pain Intervention(s): Limited activity within patient's tolerance, Monitored during session, Ice applied    Home Living Family/patient expects to be discharged to:: Private residence Living Arrangements: Alone Available Help at Discharge: Family;Available 24 hours/day Type of Home: House Home Access: Stairs to enter Entrance  Stairs-Rails: Right;Left;Can reach both Entrance Stairs-Number of Steps: 5   Home Layout: One level;Laundry or work area in [employer]: None Additional Comments: Pt has a bidet    Prior Function Prior Level of Function : Independent/Modified Independent                     Extremity/Trunk Assessment   Upper Extremity Assessment Upper Extremity Assessment: Defer to OT evaluation    Lower Extremity Assessment Lower Extremity Assessment: Generalized weakness;LLE deficits/detail LLE Deficits / Details: L TKA LLE Sensation: WNL       Communication   Communication Communication: No apparent difficulties Cueing Techniques: Verbal cues;Tactile cues  Cognition Arousal: Alert Behavior During Therapy: WFL for tasks assessed/performed Overall Cognitive Status: Within Functional Limits for tasks assessed                                          General Comments      Exercises Total Joint Exercises Ankle Circles/Pumps: AROM, Strengthening, Both, 10 reps, Supine Quad Sets: AROM, Strengthening, Left, 10 reps, Supine Heel Slides: AROM, Strengthening, Supine, Left, 10 reps Hip ABduction/ADduction: AROM, Strengthening, Supine, Left, 10 reps Other Exercises Other Exercises: Role of PT in acute setting, HEP (reps/sets/frequnecy), safe use of DME, WB status, appropriate knee placment to prevent flexion contracture   Assessment/Plan    PT Assessment Patient needs continued PT services  PT Problem List Decreased strength;Decreased mobility;Decreased range of motion;Decreased balance;Pain       PT Treatment Interventions DME instruction;Therapeutic exercise;Gait training;Balance training;Stair training;Neuromuscular re-education;Functional mobility training;Therapeutic activities;Patient/family education    PT Goals (Current goals can be found in the Care Plan section)  Acute Rehab PT Goals Patient Stated Goal: to return home PT Goal Formulation:  With patient Time For Goal Achievement: 06/18/23 Potential to Achieve Goals: Good    Frequency BID     Co-evaluation               AM-PAC PT "6 Clicks" Mobility  Outcome Measure Help needed turning from your back to your side while in a flat bed without using bedrails?: A Little Help needed moving from lying on your back to sitting on the side of a flat bed without using bedrails?: A Little Help needed moving to and from a bed to a chair (including a wheelchair)?: A Little Help needed standing up from a chair using your arms (e.g., wheelchair or bedside chair)?: A Little Help needed to walk in hospital room?: A Little Help needed climbing 3-5 steps with a railing? : A Lot 6 Click Score: 17    End of Session Equipment Utilized During Treatment: Gait belt Activity Tolerance: Patient tolerated treatment well;Other (comment) (N/V) Patient left: in chair;with call bell/phone within reach;with chair alarm set;with family/visitor present;with SCD's reapplied Nurse Communication: Mobility status PT Visit Diagnosis: Other abnormalities of gait and mobility (R26.89);Muscle weakness (generalized) (M62.81);Difficulty in walking, not elsewhere classified (R26.2)    Time: 1610-9604 PT Time Calculation (min) (ACUTE ONLY): 41 min   Charges:   PT Evaluation $PT Eval Low Complexity: 1 Low PT Treatments $Therapeutic Exercise: 8-22 mins $Therapeutic Activity: 8-22  mins PT General Charges $$ ACUTE PT VISIT: 1 Visit        Delphia Grates. Fairly IV, PT, DPT Physical Therapist- [place]  [facility]  06/04/2023, 3:25 PM

## 2023-06-04 NOTE — Interval H&P Note (Signed)
History and Physical Interval Note:  06/04/2023 6:05 AM  Jane Willis  has presented today for surgery, with the diagnosis of PRIMARY OSTEOARTHRITIS OF LEFT KNEE..  The various methods of treatment have been discussed with the patient and family. After consideration of risks, benefits and other options for treatment, the patient has consented to  Procedure(s): COMPUTER ASSISTED TOTAL KNEE ARTHROPLASTY - RNFA (Left) as a surgical intervention.  The patient's history has been reviewed, patient examined, no change in status, stable for surgery.  I have reviewed the patient's chart and labs.  Questions were answered to the patient's satisfaction.     Ramzey Petrovic P Lloyd Cullinan

## 2023-06-04 NOTE — Op Note (Signed)
OPERATIVE NOTE  DATE OF SURGERY:  06/04/2023  PATIENT NAME:  DAYLAN NAEF   DOB: 1941/02/15  MRN: [id number]  PRE-OPERATIVE DIAGNOSIS: Degenerative arthrosis of the left knee, primary  POST-OPERATIVE DIAGNOSIS:  Same  PROCEDURE:  Left total knee arthroplasty using computer-assisted navigation  SURGEON:  Jena Gauss. M.D.  ASSISTANT:  Gean Birchwood, PA-C (present and scrubbed throughout the case, critical for assistance with exposure, retraction, instrumentation, and closure)  ANESTHESIA: spinal  ESTIMATED BLOOD LOSS: 50 mL  FLUIDS REPLACED: 1200 mL of crystalloid  TOURNIQUET TIME: 105 minutes  DRAINS: 2 medium Hemovac drains  SOFT TISSUE RELEASES: Anterior cruciate ligament, posterior cruciate ligament, deep medial collateral ligament, patellofemoral ligament, and posterolateral corner  IMPLANTS UTILIZED: DePuy Attune size 5 posterior stabilized femoral component (cemented), size 5 rotating platform tibial component (cemented), 38 mm medialized dome patella (cemented), and a 7 mm stabilized rotating platform polyethylene insert.  INDICATIONS FOR SURGERY: CATHRINA MOOREHEAD is a 82 y.o. year old female with a long history of progressive knee pain. X-rays demonstrated severe degenerative changes in tricompartmental fashion. The patient had not seen any significant improvement despite conservative nonsurgical intervention. After discussion of the risks and benefits of surgical intervention, the patient expressed understanding of the risks benefits and agree with plans for total knee arthroplasty.   The risks, benefits, and alternatives were discussed at length including but not limited to the risks of infection, bleeding, nerve injury, stiffness, blood clots, the need for revision surgery, cardiopulmonary complications, among others, and they were willing to proceed.  PROCEDURE IN DETAIL: The patient was brought into the operating room and, after adequate spinal anesthesia  was achieved, a tourniquet was placed on the patient's upper thigh. The patient's knee and leg were cleaned and prepped with alcohol and DuraPrep and draped in the usual sterile fashion. A "timeout" was performed as per usual protocol. The lower extremity was exsanguinated using an Esmarch, and the tourniquet was inflated to 300 mmHg. An anterior longitudinal incision was made followed by a standard mid vastus approach. The deep fibers of the medial collateral ligament were elevated in a subperiosteal fashion off of the medial flare of the tibia so as to maintain a continuous soft tissue sleeve. The patella was subluxed laterally and the patellofemoral ligament was incised. Inspection of the knee demonstrated severe degenerative changes with full-thickness loss of articular cartilage. Osteophytes were debrided using a rongeur. Anterior and posterior cruciate ligaments were excised. Two 4.0 mm Schanz pins were inserted in the femur and into the tibia for attachment of the array of trackers used for computer-assisted navigation. Hip center was identified using a circumduction technique. Distal landmarks were mapped using the computer. The distal femur and proximal tibia were mapped using the computer. The distal femoral cutting guide was positioned using computer-assisted navigation so as to achieve a 5 distal valgus cut. The femur was sized and it was felt that a size 5 femoral component was appropriate. A size 5 femoral cutting guide was positioned and the anterior cut was performed and verified using the computer. This was followed by completion of the posterior and chamfer cuts. Femoral cutting guide for the central box was then positioned in the center box cut was performed.  Attention was then directed to the proximal tibia. Medial and lateral menisci were excised. The extramedullary tibial cutting guide was positioned using computer-assisted navigation so as to achieve a 0 varus-valgus alignment and 3  posterior slope. The cut was performed and verified using the computer.  The proximal tibia was sized and it was felt that a size 5 tibial tray was appropriate. Tibial and femoral trials were inserted followed by insertion of a 5 mm polyethylene insert. The knee was felt to be tight laterally.  The trial components were removed and the knee was brought into full extension and distracted using the Moreland retractors.  The posterolateral corner was carefully released using a combination of electrocautery and Metzenbaum scissors.  Trial components were reinserted followed by placement of a 7 mm polyethylene trial.  This allowed for excellent mediolateral soft tissue balancing both in flexion and in full extension. Finally, the patella was cut and prepared so as to accommodate a 38 mm medialized dome patella. A patella trial was placed and the knee was placed through a range of motion with excellent patellar tracking appreciated. The femoral trial was removed after debridement of posterior osteophytes. The central post-hole for the tibial component was reamed followed by insertion of a keel punch. Tibial trials were then removed. Cut surfaces of bone were irrigated with copious amounts of normal saline using pulsatile lavage and then suctioned dry. Polymethylmethacrylate cement with gentamicin was prepared in the usual fashion using a vacuum mixer. Cement was applied to the cut surface of the proximal tibia as well as along the undersurface of a size 5 rotating platform tibial component. Tibial component was positioned and impacted into place. Excess cement was removed using Personal assistant. Cement was then applied to the cut surfaces of the femur as well as along the posterior flanges of the size 5 femoral component. The femoral component was positioned and impacted into place. Excess cement was removed using Personal assistant. A 7 mm polyethylene trial was inserted and the knee was brought into full extension with  steady axial compression applied. Finally, cement was applied to the backside of a 38 mm medialized dome patella and the patellar component was positioned and patellar clamp applied. Excess cement was removed using Personal assistant. After adequate curing of the cement, the tourniquet was deflated after a total tourniquet time of 105 minutes. Hemostasis was achieved using electrocautery. The knee was irrigated with copious amounts of normal saline using pulsatile lavage followed by 450 ml of Surgiphor and then suctioned dry. 20 mL of 1.3% Exparel and 60 mL of 0.25% Marcaine in 40 mL of normal saline was injected along the posterior capsule, medial and lateral gutters, and along the arthrotomy site. A 7 mm stabilized rotating platform polyethylene insert was inserted and the knee was placed through a range of motion with excellent mediolateral soft tissue balancing appreciated and excellent patellar tracking noted. 2 medium drains were placed in the wound bed and brought out through separate stab incisions. The medial parapatellar portion of the incision was reapproximated using interrupted sutures of #1 Vicryl. Subcutaneous tissue was approximated in layers using first #0 Vicryl followed #2-0 Vicryl. The skin was approximated with skin staples. A sterile dressing was applied.  The patient tolerated the procedure well and was transported to the recovery room in stable condition.    Raed Schalk P. Angie Fava., M.D.

## 2023-06-04 NOTE — Anesthesia Postprocedure Evaluation (Signed)
Anesthesia Post Note  Patient: Jane Willis  Procedure(s) Performed: COMPUTER ASSISTED TOTAL KNEE ARTHROPLASTY (Left: Knee)  Patient location during evaluation: PACU Anesthesia Type: Spinal Level of consciousness: awake and alert Pain management: pain level controlled Vital Signs Assessment: post-procedure vital signs reviewed and stable Respiratory status: spontaneous breathing, nonlabored ventilation, respiratory function stable and patient connected to nasal cannula oxygen Cardiovascular status: blood pressure returned to baseline and stable Postop Assessment: no apparent nausea or vomiting Anesthetic complications: no   No notable events documented.   Last Vitals:  Vitals:   06/04/23 1125 06/04/23 1145  BP: 106/65 116/71  Pulse: 65 72  Resp: 13 19  Temp: 36.5 C   SpO2: 99% 96%    Last Pain:  Vitals:   06/04/23 0630  TempSrc: Oral                 Louie Boston

## 2023-06-04 NOTE — Transfer of Care (Signed)
Immediate Anesthesia Transfer of Care Note  Patient: Jane Willis  Procedure(s) Performed: COMPUTER ASSISTED TOTAL KNEE ARTHROPLASTY (Left: Knee)  Patient Location: PACU  Anesthesia Type:Spinal  Level of Consciousness: awake and patient cooperative  Airway & Oxygen Therapy: Patient Spontanous Breathing  Post-op Assessment: Report given to RN and Post -op Vital signs reviewed and stable  Post vital signs: Reviewed and stable  Last Vitals:  Vitals Value Taken Time  BP 106/65 06/04/23 1125  Temp 36.5 C 06/04/23 1125  Pulse 70 06/04/23 1128  Resp 20 06/04/23 1129  SpO2 94 % 06/04/23 1128  Vitals shown include unfiled device data.  Last Pain:  Vitals:   06/04/23 0630  TempSrc: Oral         Complications: No notable events documented.

## 2023-06-05 DIAGNOSIS — M1712 Unilateral primary osteoarthritis, left knee: Secondary | ICD-10-CM | POA: Diagnosis not present

## 2023-06-05 MED ORDER — ONDANSETRON HCL 4 MG PO TABS: 4.0000 mg | ORAL_TABLET | Freq: Three times a day (TID) | ORAL | 0 refills | Status: DC | PRN

## 2023-06-05 MED ORDER — TRAMADOL HCL 50 MG PO TABS: 50.0000 mg | ORAL_TABLET | ORAL | 0 refills | Status: DC | PRN

## 2023-06-05 MED ORDER — OXYCODONE HCL 5 MG PO TABS: 5.0000 mg | ORAL_TABLET | ORAL | 0 refills | Status: DC | PRN

## 2023-06-05 MED ORDER — CELECOXIB 200 MG PO CAPS: 200.0000 mg | ORAL_CAPSULE | Freq: Two times a day (BID) | ORAL | 1 refills | Status: DC

## 2023-06-05 MED ORDER — ASPIRIN 81 MG PO TBEC: 81.0000 mg | DELAYED_RELEASE_TABLET | Freq: Two times a day (BID) | ORAL | Status: DC

## 2023-06-05 NOTE — Progress Notes (Signed)
Occupational Therapy Treatment Patient Details Name: Jane Willis MRN: [id number] DOB: 1941/07/08 Today's Date: 06/05/2023   History of present illness Pt is an 82 y.o. female s/p elective L TKA 06/04/23.   OT comments  Pt received seated with LE elevated in recliner (polar ice on L knee and L towel roll under ankle). Appearing alert; willing to work with OT on grooming and toileting and LB dressing instruction with reacher demonstration. T/f CGA/SBA with RW; gait belt used for safety, but not needed; pt with no loss of balance; able to walk to sink in room for grooming then to bathroom for toileting. See flowsheet below for further details of session. Left seated in recliner, LE elevated; two sons in room; with all needs in reach.        If plan is discharge home, recommend the following:  A little help with walking and/or transfers;A little help with bathing/dressing/bathroom;Assistance with cooking/housework;Assist for transportation;Help with stairs or ramp for entrance   Equipment Recommendations  BSC/3in1;Other (comment) (RW)    Recommendations for Other Services      Precautions / Restrictions Precautions Precautions: Knee;Fall Restrictions Weight Bearing Restrictions: Yes LLE Weight Bearing: Weight bearing as tolerated       Mobility Bed Mobility               General bed mobility comments: NT, in recliner at start and end of session    Transfers Overall transfer level: Needs assistance Equipment used: Rolling walker (2 wheels) Transfers: Sit to/from Stand Sit to Stand: Contact guard assist           General transfer comment: pt placed hands on chair without OT assist     Balance Overall balance assessment: Mild deficits observed, not formally tested                                         ADL either performed or assessed with clinical judgement   ADL Overall ADL's : Needs assistance/impaired Eating/Feeding: Set  up Eating/Feeding Details (indicate cue type and reason): seated in recliner with LE elevated for breakfast Grooming: Oral care;Wash/dry hands;Supervision/safety;Standing Grooming Details (indicate cue type and reason): at sink; cues for how to manage walker while approaching sink; pt performed well               Lower Body Dressing Details (indicate cue type and reason): education provided on use of reacher for LB dressing and example of reacher brought to pt Toilet Transfer: Ambulation;Comfort height toilet;Rolling walker (2 wheels);Supervision/safety   Toileting- Architect and Hygiene: Supervision/safety;Sitting/lateral lean Toileting - Clothing Manipulation Details (indicate cue type and reason): pt with underwear on; managing without OT assistance     Functional mobility during ADLs: Contact guard assist;Supervision/safety;Rolling walker (2 wheels) General ADL Comments: two family members in room (2 sons) at end of session and OT provided education on type of reacher to purchase for pt (hers at home is "bulky" per pt) for assist with LB dressing. Sons will also help with IADLs and set up for meals so pt will not have to carry food while walking with RW.    Extremity/Trunk Assessment Upper Extremity Assessment Upper Extremity Assessment: Overall WFL for tasks assessed   Lower Extremity Assessment Lower Extremity Assessment: Defer to PT evaluation;LLE deficits/detail LLE Deficits / Details: L TKA        Vision       Perception  Praxis      Cognition Arousal: Alert Behavior During Therapy: WFL for tasks assessed/performed Overall Cognitive Status: Within Functional Limits for tasks assessed                                 General Comments: Pleasant, follows all cues        Exercises      Shoulder Instructions       General Comments Pt on room air. IV in pt's L arm; OT managing IV pole throughout session.    Pertinent Vitals/  Pain       Pain Assessment Pain Assessment: 0-10 Pain Score: 5  Pain Location: L knee Pain Descriptors / Indicators: Aching, Discomfort Pain Intervention(s): Limited activity within patient's tolerance, Monitored during session  Home Living                                          Prior Functioning/Environment              Frequency  Min 1X/week        Progress Toward Goals  OT Goals(current goals can now be found in the care plan section)  Progress towards OT goals: Progressing toward goals  Acute Rehab OT Goals Patient Stated Goal: be independent OT Goal Formulation: With patient Time For Goal Achievement: 06/18/23 Potential to Achieve Goals: Good ADL Goals Pt Will Perform Lower Body Dressing: with adaptive equipment;sit to/from stand;with contact guard assist Pt Will Transfer to Toilet: with modified independence;ambulating Pt Will Perform Toileting - Clothing Manipulation and hygiene: with modified independence Additional ADL Goal #1: Pt will independently instruct family/caregiver in polar care mgt. Additional ADL Goal #2: Pt will independently instruct family/caregiver in compression stocking mgt.  Plan      Co-evaluation                 AM-PAC OT "6 Clicks" Daily Activity     Outcome Measure   Help from another person eating meals?: None Help from another person taking care of personal grooming?: None Help from another person toileting, which includes using toliet, bedpan, or urinal?: None Help from another person bathing (including washing, rinsing, drying)?: A Little Help from another person to put on and taking off regular upper body clothing?: None Help from another person to put on and taking off regular lower body clothing?: A Little 6 Click Score: 22    End of Session Equipment Utilized During Treatment: Rolling walker (2 wheels);Gait belt  OT Visit Diagnosis: Other abnormalities of gait and mobility (R26.89);Pain Pain  - Right/Left: Left Pain - part of body: Knee   Activity Tolerance Patient tolerated treatment well   Patient Left in chair;with call bell/phone within reach;Other (comment) (polar ice in place; towel roll under pt's L knee)   Nurse Communication Mobility status        Time: 6962-9528 OT Time Calculation (min): 28 min  Charges: OT General Charges $OT Visit: 1 Visit OT Treatments $Self Care/Home Management : 23-37 mins  Linward Foster, MS, OTR/L  Alvester Morin 06/05/2023, 9:46 AM

## 2023-06-05 NOTE — Plan of Care (Signed)
PT goals entered as a late entry.  Aleda Grana, PT, DPT 06/05/23, 7:46 AM  Problem: Acute Rehab PT Goals(only PT should resolve) Goal: Pt Will Transfer Bed To Chair/Chair To Bed Flowsheets (Taken 06/05/2023 0744) Pt will Transfer Bed to Chair/Chair to Bed: with modified independence Note: With LRAD Goal: Pt Will Ambulate Flowsheets (Taken 06/05/2023 0744) Pt will Ambulate:  > 125 feet  with modified independence  with least restrictive assistive device Goal: Pt Will Go Up/Down Stairs Flowsheets (Taken 06/05/2023 0744) Pt will Go Up / Down Stairs:  3-5 stairs  with rail(s)  with supervision

## 2023-06-05 NOTE — Discharge Summary (Signed)
Physician Discharge Summary  Subjective: 1 Day Post-Op Procedure(s) (LRB): COMPUTER ASSISTED TOTAL KNEE ARTHROPLASTY (Left) Patient reports pain as mild.   Patient seen in rounds with Dr. Ernest Pine. Patient is well, and has had no acute complaints or problems.  Denies any shortness of breath, CP, fevers or chills.  States that she did have some nausea and emesis yesterday after surgery, but has improved today. Patient has done well with PT, and passed all of her PT protocols. Patient is ready to go home  Physician Discharge Summary  Patient ID: Jane Willis MRN: [id number] DOB/AGE: [birth year] 82 y.o.  Admit date: 06/04/2023 Discharge date: 06/05/2023  Admission Diagnoses:  Discharge Diagnoses:  Principal Problem:   Total knee replacement status   Discharged Condition: good  Hospital Course: Patient presented on the hospital on 06/04/2023 for an elective left total knee arthroplasty performed by Dr. Ernest Pine.  The patient was given 1 g of TXA and 2 g of Ancef perioperatively.  The patient was noted to have 1 short course of SVT at the beginning of the case, which resolved without any medical intervention, without any other problems during the case.  She tolerated the surgery well.  Lateral and posterior lateral releases were performed.  See operative details below.  Postoperatively, the patient was having some moderate nausea and an episode of emesis.  She was able to work with PT and OT on the same day and ambulate around her room.  Postoperatively day 1, her nausea had subsided, her pain was well-controlled and she was able to pass her PT protocols.  Her JP drain was removed without any difficulty, intact.  Vital signs are stable.  Patient has not had a bowel movement, but admits to passing flatus.  Denies any chest pain, shortness of breath, fevers, chills or nausea/vomiting.  Patient is stable for discharge  PROCEDURE:  Left total knee arthroplasty using computer-assisted navigation    SURGEON:  Jena Gauss. M.D.   ASSISTANT:  Gean Birchwood, PA-C (present and scrubbed throughout the case, critical for assistance with exposure, retraction, instrumentation, and closure)   ANESTHESIA: spinal   ESTIMATED BLOOD LOSS: 50 mL   FLUIDS REPLACED: 1200 mL of crystalloid   TOURNIQUET TIME: 105 minutes   DRAINS: 2 medium Hemovac drains   SOFT TISSUE RELEASES: Anterior cruciate ligament, posterior cruciate ligament, deep medial collateral ligament, patellofemoral ligament, and posterolateral corner   IMPLANTS UTILIZED: DePuy Attune size 5 posterior stabilized femoral component (cemented), size 5 rotating platform tibial component (cemented), 38 mm medialized dome   Treatments: None  Discharge Exam: Blood pressure (!) 156/68, pulse 62, temperature 98.3 F (36.8 C), resp. rate 17, height 5\' 2"  (1.575 m), weight 61.2 kg, SpO2 99%.   Disposition: Home   Allergies as of 06/05/2023       Reactions   Gabapentin    Balance issues   Ibuprofen Other (See Comments)   hypertension        Medication List     TAKE these medications    acetaminophen 500 MG tablet Commonly known as: TYLENOL Take 1,000 mg by mouth as needed for moderate pain.   amLODipine-valsartan 10-160 MG tablet Commonly known as: EXFORGE Take 1 tablet by mouth daily.   aspirin EC 81 MG tablet Take 1 tablet (81 mg total) by mouth in the morning and at bedtime. Swallow whole. What changed: when to take this   celecoxib 200 MG capsule Commonly known as: CELEBREX Take 1 capsule (200 mg total) by mouth 2 (  two) times daily.   cyanocobalamin 1000 MCG tablet Commonly known as: VITAMIN B12 Take 2,000 mcg by mouth daily.   ondansetron 4 MG tablet Commonly known as: Zofran Take 1 tablet (4 mg total) by mouth every 8 (eight) hours as needed for nausea or vomiting.   oxyCODONE 5 MG immediate release tablet Commonly known as: Oxy IR/ROXICODONE Take 1 tablet (5 mg total) by mouth every 4 (four)  hours as needed for moderate pain (pain score 4-6).   Psyllium Husk Powd Take 5.8 g by mouth daily. 1 rounded tsp in water daily   senna 8.6 MG Tabs tablet Commonly known as: SENOKOT Take 1 tablet (8.6 mg total) by mouth daily as needed for mild constipation.   SYSTANE OP Place 1 drop into both eyes daily as needed (dry eyes).   traMADol 50 MG tablet Commonly known as: ULTRAM Take 1-2 tablets (50-100 mg total) by mouth every 4 (four) hours as needed for moderate pain.   vitamin C 1000 MG tablet Take 1,000 mg by mouth daily.   Vitamin D-3 25 MCG (1000 UT) Caps Take 2,000 capsules by mouth daily.               Durable Medical Equipment  (From admission, onward)           Start     Ordered   06/04/23 1249  DME Walker rolling  Once       Question:  Patient needs a walker to treat with the following condition  Answer:  Total knee replacement status   06/04/23 1248   06/04/23 1249  DME Bedside commode  Once       Comments: Patient is not able to walk the distance required to go the bathroom, or he/she is unable to safely negotiate stairs required to access the bathroom.  A 3in1 BSC will alleviate this problem  Question:  Patient needs a bedside commode to treat with the following condition  Answer:  Total knee replacement status   06/04/23 1248            Follow-up Information     Tera Partridge, PA Follow up on 06/22/2023.   Specialty: Physician Assistant Why: at 1:15pm Contact information: [address] [place] [address] [phone]         Donato Heinz, MD Follow up on 07/20/2023.   Specialty: Orthopedic Surgery Why: at 3:00pm Contact information: [address] [facility] [place] [address] [phone]                 Signed: Gean Birchwood 06/05/2023, 11:29 AM   Objective: Vital signs in last 24 hours: Temp:  [97.5 F (36.4 C)-98.7 F (37.1 C)] 98.3 F (36.8 C) (08/17 0749) Pulse Rate:  [60-72] 62  (08/17 0749) Resp:  [15-19] 17 (08/17 0749) BP: (116-156)/(68-92) 156/68 (08/17 0749) SpO2:  [96 %-100 %] 99 % (08/17 0749)  Intake/Output from previous day:  Intake/Output Summary (Last 24 hours) at 06/05/2023 1129 Last data filed at 06/05/2023 1031 Gross per 24 hour  Intake 2376.66 ml  Output 270 ml  Net 2106.66 ml    Intake/Output this shift: Total I/O In: 240 [P.O.:240] Out: -   Labs: No results for input(s): "HGB" in the last 72 hours. No results for input(s): "WBC", "RBC", "HCT", "PLT" in the last 72 hours. No results for input(s): "NA", "K", "CL", "CO2", "BUN", "CREATININE", "GLUCOSE", "CALCIUM" in the last 72 hours. No results for input(s): "LABPT", "INR" in the  last 72 hours.  EXAM: General - Patient is Alert, Appropriate, and Oriented Extremity - Neurologically intact ABD soft Neurovascular intact Sensation intact distally Intact pulses distally Dorsiflexion/Plantar flexion intact No cellulitis present Compartment soft Dressing - dressing C/D/I and scant drainage Motor Function - intact, moving foot and toes well on exam.  Patient able to plantar and dorsiflex with good strength and range of motion.  Patient is also able to straight leg raise without much difficulty or assistance.  Patient is neurovascularly intact down her left lower extremity to all dermatomes.  Posterior tibial pulses appreciated 2+, cap refill less than 3 seconds. JP Drain pulled without difficulty. Intact  Assessment/Plan: 1 Day Post-Op Procedure(s) (LRB): COMPUTER ASSISTED TOTAL KNEE ARTHROPLASTY (Left) Procedure(s) (LRB): COMPUTER ASSISTED TOTAL KNEE ARTHROPLASTY (Left) Past Medical History:  Diagnosis Date   Allergy    Seasonal   Arthritis    Chicken pox    Hyperlipidemia    Hypertension    PONV (postoperative nausea and vomiting)    Urinary incontinence    Principal Problem:   Total knee replacement status  Estimated body mass index is 24.69 kg/m as calculated from the  following:   Height as of this encounter: 5\' 2"  (1.575 m).   Weight as of this encounter: 61.2 kg.  Patient has passed all of her PT protocols and is stable for discharge.  Look to transition to working with home health physical therapy.  Continue to work on gait, ambulation, strength and range of motion of this left knee.  Will look to transition to outpatient physical therapy at 2 weeks  Discussed with the patient continuing to utilize Polar Care   Patient will use bone foam in 20-30 minute intervals   Patient will wear TED hose bilaterally to help prevent DVT and clot formation   Discussed the Aquacel bandage.  This bandage will stay in place 7 days postoperatively.  Can be replaced with honeycomb bandages that will be sent home with the patient   Discussed sending the patient home with tramadol and oxycodone for as needed pain management.  Patient will also be sent home with Celebrex to help with swelling and inflammation.  Patient will take an 81 mg aspirin twice daily for DVT prophylaxis.  Patient has been sent home with a short course of Zofran to help with any postoperative nausea   JP drain removed without difficulty, intact   Weight-Bearing as tolerated to left leg   Patient will follow-up with [facility] clinic orthopedics in 2 weeks for staple removal and reevaluation   Diet - Regular diet Follow up - in 2 weeks Activity - WBAT Disposition - Home Condition Upon Discharge - Good DVT Prophylaxis - Aspirin and TED hose  Danise Edge, PA-C Orthopaedic Surgery 06/05/2023, 11:29 AM

## 2023-06-05 NOTE — TOC CM/SW Note (Signed)
Patient was prearranged with Surgeon's Office with [facility].  Patient needs RW and 3in1 for home use. Referral made to [facility] with [employer] for DME.  Alfonso Ramus, LCSW Transitions of Care Department [phone]

## 2023-06-05 NOTE — Progress Notes (Addendum)
Physical Therapy Treatment Patient Details Name: Jane Willis MRN: [id number] DOB: 1941-07-22 Today's Date: 06/05/2023   History of Present Illness Pt is an 82 y.o. female s/p elective L TKA 06/04/23.    PT Comments  Pt was sitting in recliner upon arrival. She is A and O x 4. Agrees to session and remains cooperative and pleasant throughout. Overall demonstrated safe abilities to stand and ambulate without LOB. Safely perform stairs without difficulty. Pt is cleared form an acute PT standpoint for safe DC home with HHPT to follow. Ortho PA in room at conclusion of session.    If plan is discharge home, recommend the following: A little help with walking and/or transfers;Assistance with cooking/housework;Help with stairs or ramp for entrance;Assist for transportation     Equipment Recommendations  Rolling walker (2 wheels);BSC/3in1       Precautions / Restrictions Precautions Precautions: Knee;Fall Precaution Booklet Issued: Yes (comment) Restrictions Weight Bearing Restrictions: Yes LLE Weight Bearing: Weight bearing as tolerated     Mobility  Bed Mobility  General bed mobility comments: In recliner pre/post session    Transfers Overall transfer level: Needs assistance Equipment used: Rolling walker (2 wheels) Transfers: Sit to/from Stand Sit to Stand: Supervision   Ambulation/Gait Ambulation/Gait assistance: Supervision Gait Distance (Feet): 200 Feet Assistive device: Rolling walker (2 wheels) Gait Pattern/deviations: Step-to pattern, Decreased stance time - left, Decreased dorsiflexion - left, Decreased weight shift to left  General Gait Details: Pt was able to ambulate ~ 120 ft and perform stairs without safety concerns   Stairs Stairs: Yes Stairs assistance: Supervision Stair Management: Two rails, Step to pattern, Forwards Number of Stairs: 4 General stair comments: Pt was able to safely perform stairs without difficulty    Balance Overall balance  assessment: Needs assistance Sitting-balance support: No upper extremity supported, Feet supported Sitting balance-Leahy Scale: Good     Standing balance support: Bilateral upper extremity supported, During functional activity, Reliant on assistive device for balance Standing balance-Leahy Scale: Fair       Cognition Arousal: Alert Behavior During Therapy: WFL for tasks assessed/performed Overall Cognitive Status: Within Functional Limits for tasks assessed      General Comments: Pt is A and O x 4. PLeasant and agreeable. Has great home support at DC        Exercises Total Joint Exercises Goniometric ROM: ~2-88degrees    General Comments General comments (skin integrity, edema, etc.): reviewed polar care use, poisitioning, car transfers, and importnace of ROM/strengthening. pt states understanding and will have HHPT start tomorrow      Pertinent Vitals/Pain Pain Assessment Pain Assessment: 0-10 Pain Score: 3  Faces Pain Scale: Hurts a little bit Pain Location: L knee Pain Descriptors / Indicators: Aching, Discomfort Pain Intervention(s): Limited activity within patient's tolerance, Monitored during session, Premedicated before session, Repositioned, Ice applied     PT Goals (current goals can now be found in the care plan section) Acute Rehab PT Goals Patient Stated Goal: to return home Progress towards PT goals: Progressing toward goals    Frequency    BID       AM-PAC PT "6 Clicks" Mobility   Outcome Measure  Help needed turning from your back to your side while in a flat bed without using bedrails?: A Little Help needed moving from lying on your back to sitting on the side of a flat bed without using bedrails?: A Little Help needed moving to and from a bed to a chair (including a wheelchair)?: A Little Help needed standing  up from a chair using your arms (e.g., wheelchair or bedside chair)?: A Little Help needed to walk in hospital room?: A Little Help  needed climbing 3-5 steps with a railing? : A Little 6 Click Score: 18    End of Session   Activity Tolerance: Patient tolerated treatment well Patient left: in chair;with call bell/phone within reach;with chair alarm set;with family/visitor present;with SCD's reapplied Nurse Communication: Mobility status PT Visit Diagnosis: Other abnormalities of gait and mobility (R26.89);Muscle weakness (generalized) (M62.81);Difficulty in walking, not elsewhere classified (R26.2)     Time: 1040-1101 PT Time Calculation (min) (ACUTE ONLY): 21 min  Charges:    $Gait Training: 8-22 mins PT General Charges $$ ACUTE PT VISIT: 1 Visit                     Jetta Lout PTA 06/05/23, 12:56 PM

## 2023-06-05 NOTE — Progress Notes (Signed)
Subjective: 1 Day Post-Op Procedure(s) (LRB): COMPUTER ASSISTED TOTAL KNEE ARTHROPLASTY (Left) Patient reports pain as mild.   Patient seen in rounds with Dr. Ernest Pine. Patient is well, and has had no acute complaints or problems.  Denies any shortness of breath, CP, fevers or chills.  States that she did have some nausea and emesis yesterday after surgery, but has improved today. Patient has done well with PT, and passed all of her PT protocols. Plan is to go Home after hospital stay.  Objective: Vital signs in last 24 hours: Temp:  [97.5 F (36.4 C)-98.7 F (37.1 C)] 98.3 F (36.8 C) (08/17 0749) Pulse Rate:  [60-72] 62 (08/17 0749) Resp:  [13-19] 17 (08/17 0749) BP: (106-156)/(65-92) 156/68 (08/17 0749) SpO2:  [96 %-100 %] 99 % (08/17 0749)  Intake/Output from previous day:  Intake/Output Summary (Last 24 hours) at 06/05/2023 1049 Last data filed at 06/05/2023 1031 Gross per 24 hour  Intake 2376.66 ml  Output 270 ml  Net 2106.66 ml    Intake/Output this shift: Total I/O In: 240 [P.O.:240] Out: -   Labs: No results for input(s): "HGB" in the last 72 hours. No results for input(s): "WBC", "RBC", "HCT", "PLT" in the last 72 hours. No results for input(s): "NA", "K", "CL", "CO2", "BUN", "CREATININE", "GLUCOSE", "CALCIUM" in the last 72 hours. No results for input(s): "LABPT", "INR" in the last 72 hours.  EXAM General - Patient is Alert, Appropriate, and Oriented Extremity - Neurologically intact ABD soft Neurovascular intact Sensation intact distally Intact pulses distally Dorsiflexion/Plantar flexion intact No cellulitis present Compartment soft Dressing - dressing C/D/I and scant drainage Motor Function - intact, moving foot and toes well on exam.  Patient able to plantar and dorsiflex with good strength and range of motion.  Patient is also able to straight leg raise without much difficulty or assistance.  Patient is neurovascularly intact down her left lower  extremity to all dermatomes.  Posterior tibial pulses appreciated 2+, cap refill less than 3 seconds. JP Drain pulled without difficulty. Intact  Past Medical History:  Diagnosis Date   Allergy    Seasonal   Arthritis    Chicken pox    Hyperlipidemia    Hypertension    PONV (postoperative nausea and vomiting)    Urinary incontinence     Assessment/Plan: 1 Day Post-Op Procedure(s) (LRB): COMPUTER ASSISTED TOTAL KNEE ARTHROPLASTY (Left) Principal Problem:   Total knee replacement status  Estimated body mass index is 24.69 kg/m as calculated from the following:   Height as of this encounter: 5\' 2"  (1.575 m).   Weight as of this encounter: 61.2 kg. Advance diet Up with therapy  Patient will continue to work with physical therapy to pass postoperative PT protocols, ROM and strengthening  Discussed with the patient continuing to utilize Polar Care  Patient will use bone foam in 20-30 minute intervals  Patient will wear TED hose bilaterally to help prevent DVT and clot formation  Discussed the Aquacel bandage.  This bandage will stay in place 7 days postoperatively.  Can be replaced with honeycomb bandages that will be sent home with the patient  Discussed sending the patient home with tramadol and oxycodone for as needed pain management.  Patient will also be sent home with Celebrex to help with swelling and inflammation.  Patient will take an 81 mg aspirin twice daily for DVT prophylaxis  JP drain removed without difficulty, intact  Weight-Bearing as tolerated to left leg  Patient will follow-up with [facility] orthopedics in  2 weeks for staple removal and reevaluation  Rayburn Go, PA-C [facility] 06/05/2023, 10:49 AM

## 2023-10-22 ENCOUNTER — Encounter: Payer: Self-pay | Admitting: Family Medicine

## 2023-10-22 ENCOUNTER — Ambulatory Visit: Payer: Medicare Other | Admitting: Family Medicine

## 2023-10-22 VITALS — BP 128/82 | HR 74 | Temp 98.6°F | Ht 62.0 in | Wt 134.0 lb

## 2023-10-22 DIAGNOSIS — E538 Deficiency of other specified B group vitamins: Secondary | ICD-10-CM | POA: Diagnosis not present

## 2023-10-22 DIAGNOSIS — R7303 Prediabetes: Secondary | ICD-10-CM

## 2023-10-22 DIAGNOSIS — R519 Headache, unspecified: Secondary | ICD-10-CM

## 2023-10-22 DIAGNOSIS — I10 Essential (primary) hypertension: Secondary | ICD-10-CM

## 2023-10-22 DIAGNOSIS — M25572 Pain in left ankle and joints of left foot: Secondary | ICD-10-CM | POA: Insufficient documentation

## 2023-10-22 DIAGNOSIS — E785 Hyperlipidemia, unspecified: Secondary | ICD-10-CM | POA: Diagnosis not present

## 2023-10-22 DIAGNOSIS — M1712 Unilateral primary osteoarthritis, left knee: Secondary | ICD-10-CM

## 2023-10-22 LAB — COMPREHENSIVE METABOLIC PANEL
ALT: 10 U/L (ref 0–35)
AST: 18 U/L (ref 0–37)
Albumin: 4 g/dL (ref 3.5–5.2)
Alkaline Phosphatase: 75 U/L (ref 39–117)
BUN: 19 mg/dL (ref 6–23)
CO2: 24 meq/L (ref 19–32)
Calcium: 9.1 mg/dL (ref 8.4–10.5)
Chloride: 102 meq/L (ref 96–112)
Creatinine, Ser: 0.82 mg/dL (ref 0.40–1.20)
GFR: 65.71 mL/min (ref >60.00)
Glucose, Bld: 84 mg/dL (ref 70–99)
Potassium: 4.3 meq/L (ref 3.5–5.1)
Sodium: 137 meq/L (ref 135–145)
Total Bilirubin: 0.5 mg/dL (ref 0.2–1.2)
Total Protein: 6.8 g/dL (ref 6.0–8.3)

## 2023-10-22 LAB — LIPID PANEL
Cholesterol: 250 mg/dL — ABNORMAL HIGH (ref 0–200)
HDL: 60.6 mg/dL (ref >39.00)
LDL Cholesterol: 160 mg/dL — ABNORMAL HIGH (ref 0–99)
NonHDL: 188.98
Total CHOL/HDL Ratio: 4
Triglycerides: 145 mg/dL (ref 0.0–149.0)
VLDL: 29 mg/dL (ref 0.0–40.0)

## 2023-10-22 LAB — HEMOGLOBIN A1C: Hgb A1c MFr Bld: 6.2 % (ref 4.6–6.5)

## 2023-10-22 LAB — VITAMIN B12: Vitamin B-12: 1348 pg/mL — ABNORMAL HIGH (ref 211–911)

## 2023-10-22 MED ORDER — AMLODIPINE BESYLATE-VALSARTAN 10-160 MG PO TABS
1.0000 | ORAL_TABLET | Freq: Every day | ORAL | 1 refills | Status: DC
Start: 2023-10-22 — End: 2023-12-23

## 2023-10-22 NOTE — Assessment & Plan Note (Signed)
 Chronic issue.  Status post left knee replacement.  Doing much better.  She will monitor.  She will continue to follow with orthopedics.

## 2023-10-22 NOTE — Assessment & Plan Note (Signed)
 Chronic issue.  Check A1c.

## 2023-10-22 NOTE — Assessment & Plan Note (Signed)
 Possible related to arthritis.  Patient can continue Aspercreme.  Encouraged her to follow-up with orthopedics if pain is persisting.

## 2023-10-22 NOTE — Assessment & Plan Note (Signed)
 Chronic issue.  Check B12.

## 2023-10-22 NOTE — Assessment & Plan Note (Signed)
 Chronic issue.  Adequately controlled for age.  Patient will continue amlodipine-valsartan 10 mg - 160 mg daily.  Check labs as outlined.

## 2023-10-22 NOTE — Assessment & Plan Note (Signed)
Chronic issue.  Check lipid panel.

## 2023-10-22 NOTE — Progress Notes (Signed)
 Camellia Her, MD Phone: [phone]  Jane Willis is a 83 y.o. female who presents today for follow-up.  Hypertension: Typically 120s-145/70s.  Taking Exforge .  No chest pain, shortness breath, or edema.  Status post left knee replacement: Patient notes this went well.  She has been doing well in recovery.  Left ankle pain: This has been going on for a while now.  She went to the walk-in clinic and they got x-rays that revealed degenerative changes.  Worsens throughout the day.  When she wakes up in the morning there is no swelling.  She has noticed a little bit of swelling in her right ankle as well.  Social History   Tobacco Use  Smoking Status Never  Smokeless Tobacco Never    Current Outpatient Medications on File Prior to Visit  Medication Sig Dispense Refill   acetaminophen  (TYLENOL ) 500 MG tablet Take 1,000 mg by mouth as needed for moderate pain.     Ascorbic Acid  (VITAMIN C) 1000 MG tablet Take 1,000 mg by mouth daily.     aspirin  EC 81 MG tablet Take 1 tablet (81 mg total) by mouth in the morning and at bedtime. Swallow whole.     Cholecalciferol  (VITAMIN D -3) 1000 UNITS CAPS Take 2,000 capsules by mouth daily.     ondansetron  (ZOFRAN ) 4 MG tablet Take 1 tablet (4 mg total) by mouth every 8 (eight) hours as needed for nausea or vomiting. 30 tablet 0   Polyethyl Glycol-Propyl Glycol (SYSTANE OP) Place 1 drop into both eyes daily as needed (dry eyes).     Psyllium Husk POWD Take 5.8 g by mouth daily. 1 rounded tsp in water daily     senna (SENOKOT) 8.6 MG TABS tablet Take 1 tablet (8.6 mg total) by mouth daily as needed for mild constipation. 30 tablet 0   traMADol  (ULTRAM ) 50 MG tablet Take 1-2 tablets (50-100 mg total) by mouth every 4 (four) hours as needed for moderate pain. 30 tablet 0   celecoxib  (CELEBREX ) 200 MG capsule Take 1 capsule (200 mg total) by mouth 2 (two) times daily. (Patient not taking: Reported on 10/22/2023) 60 capsule 1   diclofenac Sodium  (VOLTAREN) 1 % GEL Apply 2 g topically 3 (three) times daily as needed. (Patient not taking: Reported on 10/22/2023)     oxyCODONE  (OXY IR/ROXICODONE ) 5 MG immediate release tablet Take 1 tablet (5 mg total) by mouth every 4 (four) hours as needed for moderate pain (pain score 4-6). (Patient not taking: Reported on 10/22/2023) 30 tablet 0   vitamin B-12 (CYANOCOBALAMIN ) 1000 MCG tablet Take 2,000 mcg by mouth daily. (Patient not taking: Reported on 10/22/2023)     No current facility-administered medications on file prior to visit.     ROS see history of present illness  Objective  Physical Exam Vitals:   10/22/23 0903  BP: 128/82  Pulse: 74  Temp: 98.6 F (37 C)  SpO2: 99%    BP Readings from Last 3 Encounters:  10/22/23 128/82  06/05/23 (!) 156/68  05/24/23 104/60   Wt Readings from Last 3 Encounters:  10/22/23 134 lb (60.8 kg)  06/04/23 134 lb 15.8 oz (61.2 kg)  05/24/23 135 lb (61.2 kg)    Physical Exam Constitutional:      General: She is not in acute distress.    Appearance: She is not diaphoretic.  Cardiovascular:     Rate and Rhythm: Normal rate and regular rhythm.     Heart sounds: Normal heart sounds.  Pulmonary:  Effort: Pulmonary effort is normal.     Breath sounds: Normal breath sounds.  Musculoskeletal:     Comments: Left ankle with postsurgical changes along the lateral malleolus, no tenderness of the left ankle, there is 1+ pitting edema left ankle and lower shin and trace pitting edema right lower shin  Skin:    General: Skin is warm and dry.  Neurological:     Mental Status: She is alert.      Assessment/Plan: Please see individual problem list.  Primary hypertension Assessment & Plan: Chronic issue.  Adequately controlled for age.  Patient will continue amlodipine -valsartan  10 mg - 160 mg daily.  Check labs as outlined.  Orders: -     amLODIPine  Besylate-Valsartan ; Take 1 tablet by mouth daily.  Dispense: 90 tablet; Refill: 1 -      Comprehensive metabolic panel  Hyperlipidemia, unspecified hyperlipidemia type Assessment & Plan: Chronic issue.  Check lipid panel.  Orders: -     Lipid panel  B12 deficiency Assessment & Plan: Chronic issue.  Check B12.  Orders: -     Vitamin B12  Prediabetes Assessment & Plan: Chronic issue.  Check A1c.  Orders: -     Hemoglobin A1c  Left ankle pain, unspecified chronicity Assessment & Plan: Possible related to arthritis.  Patient can continue Aspercreme.  Encouraged her to follow-up with orthopedics if pain is persisting.   Primary osteoarthritis of left knee Assessment & Plan: Chronic issue.  Status post left knee replacement.  Doing much better.  She will monitor.  She will continue to follow with orthopedics.      Return in about 6 months (around 04/20/2024) for Transfer of care.   Camellia Her, MD [facility]

## 2023-12-23 ENCOUNTER — Other Ambulatory Visit: Payer: Self-pay | Admitting: Family Medicine

## 2023-12-23 DIAGNOSIS — I10 Essential (primary) hypertension: Secondary | ICD-10-CM

## 2023-12-23 MED ORDER — AMLODIPINE BESYLATE-VALSARTAN 10-160 MG PO TABS
1.0000 | ORAL_TABLET | Freq: Every day | ORAL | 1 refills | Status: DC
Start: 2023-12-23 — End: 2023-12-27

## 2023-12-23 NOTE — Telephone Encounter (Signed)
 Copied from CRM [id number]. Topic: Clinical - Medication Refill >> Dec 23, 2023 12:12 PM [place] E wrote: Most Recent Primary Care Visit:  Provider: Birdie Sons, ERIC G  Department: [facility]  Visit Type: OFFICE VISIT  Date: 10/22/2023  Medication: amLODipine-valsartan (EXFORGE) 10-160 MG tablet  Has the patient contacted their pharmacy? Yes (Agent: If no, request that the patient contact the pharmacy for the refill. If patient does not wish to contact the pharmacy document the reason why and proceed with request.) (Agent: If yes, when and what did the pharmacy advise?)  Is this the correct pharmacy for this prescription? Yes If no, delete pharmacy and type the correct one.  This is the patient's preferred pharmacy:  [id number] [place], [place] - [address] AT [facility] OF [address] [place] [address] Phone: [phone] Fax: [phone]   Has the prescription been filled recently? No  Is the patient out of the medication? No, patient has 6 tablets left.  Has the patient been seen for an appointment in the last year OR does the patient have an upcoming appointment? Yes  Can we respond through MyChart? Yes  Agent: Please be advised that Rx refills may take up to 3 business days. We ask that you follow-up with your pharmacy.

## 2023-12-27 ENCOUNTER — Telehealth: Payer: Self-pay | Admitting: Family Medicine

## 2023-12-27 DIAGNOSIS — I10 Essential (primary) hypertension: Secondary | ICD-10-CM

## 2023-12-27 MED ORDER — AMLODIPINE BESYLATE-VALSARTAN 10-160 MG PO TABS
1.0000 | ORAL_TABLET | Freq: Every day | ORAL | 0 refills | Status: DC
Start: 2023-12-27 — End: 2024-03-03

## 2023-12-27 NOTE — Telephone Encounter (Signed)
 Prescription Request  12/27/2023  LOV: 10/22/2023  What is the name of the medication or equipment?   amLODipine-valsartan (EXFORGE) 10-160 MG tablet   Have you contacted your pharmacy to request a refill? No   Which pharmacy would you like this sent to?  [facility] #[id number] [place], [place] - [address] AT [facility] OF [address] [place] [address] Phone: [phone] Fax: [phone]    Patient notified that their request is being sent to the clinical staff for review and that they should receive a response within 2 business days.   Please advise at [facility] [phone]

## 2023-12-27 NOTE — Telephone Encounter (Signed)
 Meds sent to pharmacy.

## 2023-12-29 ENCOUNTER — Telehealth: Payer: Self-pay | Admitting: Family Medicine

## 2023-12-29 NOTE — Telephone Encounter (Signed)
 Copied from CRM [id number]. Topic: Medicare AWV >> Dec 29, 2023 11:27 AM Payton Doughty wrote: Reason for CRM: Called LVM 12/29/2023 to schedule AWV. Please schedule office or virtual visits.  Verlee Rossetti; Care Guide Ambulatory Clinical Support [facility] Direct Dial: [phone]

## 2024-02-28 ENCOUNTER — Other Ambulatory Visit: Payer: Self-pay

## 2024-02-28 ENCOUNTER — Emergency Department

## 2024-02-28 ENCOUNTER — Inpatient Hospital Stay
Admission: EM | Admit: 2024-02-28 | Discharge: 2024-03-03 | DRG: 291 | Disposition: A | Discharge status: Home / Self Care | Source: Ambulatory Visit | Attending: Internal Medicine | Admitting: Internal Medicine

## 2024-02-28 ENCOUNTER — Encounter: Payer: Self-pay | Admitting: Emergency Medicine

## 2024-02-28 DIAGNOSIS — Z96659 Presence of unspecified artificial knee joint: Secondary | ICD-10-CM

## 2024-02-28 DIAGNOSIS — I509 Heart failure, unspecified: Secondary | ICD-10-CM | POA: Diagnosis not present

## 2024-02-28 DIAGNOSIS — I513 Intracardiac thrombosis, not elsewhere classified: Secondary | ICD-10-CM | POA: Diagnosis present

## 2024-02-28 DIAGNOSIS — R0609 Other forms of dyspnea: Secondary | ICD-10-CM | POA: Diagnosis not present

## 2024-02-28 DIAGNOSIS — I4891 Unspecified atrial fibrillation: Secondary | ICD-10-CM | POA: Diagnosis present

## 2024-02-28 DIAGNOSIS — Z8249 Family history of ischemic heart disease and other diseases of the circulatory system: Secondary | ICD-10-CM

## 2024-02-28 DIAGNOSIS — R7303 Prediabetes: Secondary | ICD-10-CM | POA: Diagnosis present

## 2024-02-28 DIAGNOSIS — Z90710 Acquired absence of both cervix and uterus: Secondary | ICD-10-CM | POA: Diagnosis not present

## 2024-02-28 DIAGNOSIS — I5021 Acute systolic (congestive) heart failure: Secondary | ICD-10-CM | POA: Diagnosis present

## 2024-02-28 DIAGNOSIS — Z96652 Presence of left artificial knee joint: Secondary | ICD-10-CM | POA: Diagnosis present

## 2024-02-28 DIAGNOSIS — Z79899 Other long term (current) drug therapy: Secondary | ICD-10-CM | POA: Diagnosis not present

## 2024-02-28 DIAGNOSIS — I081 Rheumatic disorders of both mitral and tricuspid valves: Secondary | ICD-10-CM | POA: Diagnosis present

## 2024-02-28 DIAGNOSIS — I110 Hypertensive heart disease with heart failure: Principal | ICD-10-CM | POA: Diagnosis present

## 2024-02-28 DIAGNOSIS — E785 Hyperlipidemia, unspecified: Secondary | ICD-10-CM | POA: Diagnosis present

## 2024-02-28 DIAGNOSIS — I10 Essential (primary) hypertension: Secondary | ICD-10-CM | POA: Diagnosis present

## 2024-02-28 DIAGNOSIS — Z981 Arthrodesis status: Secondary | ICD-10-CM

## 2024-02-28 DIAGNOSIS — Z7901 Long term (current) use of anticoagulants: Secondary | ICD-10-CM | POA: Diagnosis not present

## 2024-02-28 DIAGNOSIS — R0789 Other chest pain: Secondary | ICD-10-CM | POA: Insufficient documentation

## 2024-02-28 LAB — BASIC METABOLIC PANEL WITH GFR
Anion gap: 10 (ref 5–15)
BUN: 20 mg/dL (ref 8–23)
CO2: 22 mmol/L (ref 22–32)
Calcium: 8.8 mg/dL — ABNORMAL LOW (ref 8.9–10.3)
Chloride: 103 mmol/L (ref 98–111)
Creatinine, Ser: 0.87 mg/dL (ref 0.44–1.00)
GFR, Estimated: >60 mL/min (ref >60)
Glucose, Bld: 103 mg/dL — ABNORMAL HIGH (ref 70–99)
Potassium: 4.2 mmol/L (ref 3.5–5.1)
Sodium: 135 mmol/L (ref 135–145)

## 2024-02-28 LAB — CBC
HCT: 37.6 % (ref 36.0–46.0)
Hemoglobin: 12 g/dL (ref 12.0–15.0)
MCH: 28.6 pg (ref 26.0–34.0)
MCHC: 31.9 g/dL (ref 30.0–36.0)
MCV: 89.7 fL (ref 80.0–100.0)
Platelets: 268 10*3/uL (ref 150–400)
RBC: 4.19 MIL/uL (ref 3.87–5.11)
RDW: 14.5 % (ref 11.5–15.5)
WBC: 5.9 10*3/uL (ref 4.0–10.5)
nRBC: 0 % (ref 0.0–0.2)

## 2024-02-28 LAB — PROTIME-INR
INR: 1.1 (ref 0.8–1.2)
Prothrombin Time: 14.3 s (ref 11.4–15.2)

## 2024-02-28 LAB — MAGNESIUM: Magnesium: 2.1 mg/dL (ref 1.7–2.4)

## 2024-02-28 LAB — BRAIN NATRIURETIC PEPTIDE: B Natriuretic Peptide: 659.4 pg/mL — ABNORMAL HIGH (ref 0.0–100.0)

## 2024-02-28 LAB — TROPONIN I (HIGH SENSITIVITY)
Troponin I (High Sensitivity): 8 ng/L (ref <18)
Troponin I (High Sensitivity): 8 ng/L (ref <18)

## 2024-02-28 LAB — APTT: aPTT: 30 s (ref 24–36)

## 2024-02-28 MED ORDER — FUROSEMIDE 10 MG/ML IJ SOLN
40.0000 mg | Freq: Once | INTRAMUSCULAR | Status: AC
  Administered 2024-02-28: 40 mg via INTRAVENOUS
  Filled 2024-02-28: qty 4

## 2024-02-28 MED ORDER — HEPARIN (PORCINE) 25000 UT/250ML-% IV SOLN
800.0000 [IU]/h | INTRAVENOUS | Status: DC
  Administered 2024-02-28: 900 [IU]/h via INTRAVENOUS
  Filled 2024-02-28: qty 250

## 2024-02-28 MED ORDER — ONDANSETRON HCL 4 MG/2ML IJ SOLN: 4.0000 mg | Freq: Four times a day (QID) | INTRAMUSCULAR | Status: DC | PRN

## 2024-02-28 MED ORDER — ACETAMINOPHEN 325 MG PO TABS: 650.0000 mg | ORAL_TABLET | Freq: Four times a day (QID) | ORAL | Status: DC | PRN

## 2024-02-28 MED ORDER — FUROSEMIDE 10 MG/ML IJ SOLN
40.0000 mg | Freq: Two times a day (BID) | INTRAMUSCULAR | Status: AC
  Administered 2024-02-29 (×2): 40 mg via INTRAVENOUS
  Filled 2024-02-28 (×2): qty 4

## 2024-02-28 MED ORDER — ACETAMINOPHEN 650 MG RE SUPP: 650.0000 mg | Freq: Four times a day (QID) | RECTAL | Status: DC | PRN

## 2024-02-28 MED ORDER — HEPARIN BOLUS VIA INFUSION
3300.0000 [IU] | Freq: Once | INTRAVENOUS | Status: AC
  Administered 2024-02-28: 3300 [IU] via INTRAVENOUS
  Filled 2024-02-28: qty 3300

## 2024-02-28 MED ORDER — SENNOSIDES-DOCUSATE SODIUM 8.6-50 MG PO TABS: 1.0000 | ORAL_TABLET | Freq: Every evening | ORAL | Status: DC | PRN

## 2024-02-28 MED ORDER — ONDANSETRON HCL 4 MG PO TABS: 4.0000 mg | ORAL_TABLET | Freq: Four times a day (QID) | ORAL | Status: DC | PRN

## 2024-02-28 MED ORDER — HEPARIN SODIUM (PORCINE) 5000 UNIT/ML IJ SOLN: 5000.0000 [IU] | Freq: Three times a day (TID) | INTRAMUSCULAR | Status: DC

## 2024-02-28 MED ORDER — ASPIRIN 81 MG PO TBEC
81.0000 mg | DELAYED_RELEASE_TABLET | Freq: Every day | ORAL | Status: DC
  Administered 2024-02-29 – 2024-03-03 (×4): 81 mg via ORAL
  Filled 2024-02-28 (×4): qty 1

## 2024-02-28 MED ORDER — METOPROLOL TARTRATE 5 MG/5ML IV SOLN
5.0000 mg | INTRAVENOUS | Status: AC | PRN
  Administered 2024-02-29 – 2024-03-01 (×3): 5 mg via INTRAVENOUS
  Filled 2024-02-28 (×3): qty 5

## 2024-02-28 MED ORDER — METOPROLOL TARTRATE 5 MG/5ML IV SOLN
5.0000 mg | Freq: Once | INTRAVENOUS | Status: AC
  Administered 2024-02-28: 5 mg via INTRAVENOUS
  Filled 2024-02-28: qty 5

## 2024-02-28 MED ORDER — LABETALOL HCL 5 MG/ML IV SOLN: 5.0000 mg | INTRAVENOUS | Status: DC | PRN

## 2024-02-28 MED ORDER — NITROGLYCERIN 0.4 MG SL SUBL: 0.4000 mg | SUBLINGUAL_TABLET | SUBLINGUAL | Status: DC | PRN

## 2024-02-28 NOTE — Hospital Course (Addendum)
 [name] is a 83 year old female with history of hypertension, history of left total knee arthroplasty, who presents emergency department for chief concerns of shortness of breath from [facility].  Vitals in the ED showed T of 98, rr 17, hr 103, blood pressure 117/95, SpO2 of 93% on room air.  Serum sodium is 135, potassium 4.2, chloride 103, bicarb 22, BUN of 20, serum creatinine of 2.87, EGFR greater than 60, nonfasting blood glucose 103, WBC 5.9, hemoglobin 12, platelets of 268.  BNP was elevated at 659.4.  ED treatment: Furosemide 40 mg IV one-time dose, metoprolol 5 mg IV one-time dose.  She came in to the ED with a productive cough and shortness of breath yesterday. While she was there she was found to be in afib with RVR. When she was admitted she stated she had orthopnea, weight gain, and BLE edema. She denied chest pain to me. She was started on doses of IV metoprolol and lasix. I am consulting you all for assistance managing her a fib with RVR and possible new onset HF.

## 2024-02-28 NOTE — Progress Notes (Signed)
 PHARMACY - ANTICOAGULATION CONSULT NOTE  Pharmacy Consult for Heparin Infusion Indication: atrial fibrillation  Allergies  Allergen Reactions   Gabapentin     Balance issues   Ibuprofen Other (See Comments)    hypertension    Patient Measurements: Height: 5\' 3"  (160 cm) Weight: 68 kg (150 lb) IBW/kg (Calculated) : 52.4 HEPARIN DW (KG): 66.3  Vital Signs: Temp: 98.3 F (36.8 C) (05/12 1453) Temp Source: Oral (05/12 1453) BP: 118/89 (05/12 1809) Pulse Rate: 105 (05/12 1759)  Labs: Recent Labs    02/28/24 1458  HGB 12.0  HCT 37.6  PLT 268  CREATININE 0.87  TROPONINIHS 8    Estimated Creatinine Clearance: 44.5 mL/min (by C-G formula based on SCr of 0.87 mg/dL).   Medical History: Past Medical History:  Diagnosis Date   Allergy    Seasonal   Arthritis    Chicken pox    Hyperlipidemia    Hypertension    PONV (postoperative nausea and vomiting)    Urinary incontinence     Medications:  Not on anticoagulation per chart review  Assessment: Patient presented to ED for SOB that has been ongoing for 3 weeks. In the ED, patient was tachycardic with HRs in the 140-160s and noted to be in Afib. Pharmacy has been consulted to initiate patient on a heparin infusion.   Baseline INR and aPTT ordered.  No signs/symptoms of bleeding per chart review. Hgb 12. PLT 268.  Goal of Therapy:  Heparin level 0.3-0.7 units/ml Monitor platelets by anticoagulation protocol: Yes   Plan:  - Give 3300 unit bolus x 1 - Start heparin infusion at a rate of 900 units/hr - Check heparin level in 8 hours after start of infusion - Monitor CBC daily while on heparin  Alice Innocent, PharmD, BCPS 02/28/2024,6:51 PM

## 2024-02-28 NOTE — Assessment & Plan Note (Addendum)
 CHA2DS2VASc = 5 Heparin gtt. initiated

## 2024-02-28 NOTE — Assessment & Plan Note (Signed)
 Pending complete echo, patient may benefit from exercise stress testing evaluation A.m. team to consult cardiology as appropriate

## 2024-02-28 NOTE — H&P (Signed)
 History and Physical   Jane Willis ZOX:[id number] DOB: [birth year] DOA: 02/28/2024  PCP: Bair, Kalpana, MD  Outpatient Specialists: Dr. Aubry Blase, orthopedic specialist Patient coming from: [facility] clinic via POV  I have personally briefly reviewed patient's old medical records in [facility] EMR.  Chief Concern: Shortness of breath  HPI: [name] is a 83 year old female with history of hypertension, history of left total knee arthroplasty, who presents emergency department for chief concerns of shortness of breath from [facility].  Vitals in the ED showed T of 98, rr 17, hr 103, blood pressure 117/95, SpO2 of 93% on room air.  Serum sodium is 135, potassium 4.2, chloride 103, bicarb 22, BUN of 20, serum creatinine of 2.87, EGFR greater than 60, nonfasting blood glucose 103, WBC 5.9, hemoglobin 12, platelets of 268.  BNP was elevated at 659.4.  ED treatment: Furosemide 40 mg IV one-time dose, metoprolol 5 mg IV one-time dose. ------------------------------------- At bedside, patient was able to tell me her first and last name, age, location, current calendar year.  She reports that over the last 3-3 and half weeks, she has been having increasing congestion and runny nose.  She initially thought it was allergies.  She endorses associated shortness of breath and cough that was initially productive of white sputum however now it is white sputum.  She denies fever, chills, current chest pain, abdominal pain, dysuria, hematuria, diarrhea.  She endorses swelling of her lower extremities, for the past 2 to 3 weeks.  She endorses chest pressure especially with exertion over the last 2 to 3 weeks.  She denies current chest pain, chest pressure at this time.  Social history: She lives at home on her own.  She denies tobacco, EtOH, recreational drug use.  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  ROS: Constitutional: no weight change, no fever ENT/Mouth: no sore  throat, no rhinorrhea Eyes: no eye pain, no vision changes Cardiovascular: no chest pain, + dyspnea,  + edema, no palpitations Respiratory: + cough, no sputum, no wheezing Gastrointestinal: no nausea, no vomiting, no diarrhea, no constipation Genitourinary: no urinary incontinence, no dysuria, no hematuria Musculoskeletal: no arthralgias, no myalgias Skin: no skin lesions, no pruritus, Neuro: + weakness, no loss of consciousness, no syncope Psych: no anxiety, no depression, + decrease appetite Heme/Lymph: no bruising, no bleeding  ED Course: Discussed with EDP, patient requiring hospitalization for chief concerns of atrial fibrillation with RVR concerning for new onset heart failure.  Assessment/Plan  Principal Problem:   New onset of congestive heart failure (HCC) Active Problems:   Hypertension   Prediabetes   Total knee replacement status   Atrial fibrillation with RVR (HCC)   New onset atrial fibrillation (HCC)   Chest pressure   Assessment and Plan:  * New onset of congestive heart failure (HCC) Complete echo ordered Strict I's and O's AM team to consult cardiology pending complete echo if needed  Chest pressure Pending complete echo, patient may benefit from exercise stress testing evaluation A.m. team to consult cardiology as appropriate  New onset atrial fibrillation (HCC) CHA2DS2VASc = 5 Heparin gtt. initiated  Atrial fibrillation with RVR (HCC) No prior history of atrial fibrillation Unclear how long patient has been in atrial fibrillation at this time Suspect secondary to new onset heart failure Metoprolol 5 mg IV every 4 hours as needed for heart rate greater than 120; will avoid cardioversion on admission pending complete echo Complete echo ordered  Hypertension Home amlodipine -valsartan  10-160 milligrams daily not resumed on  admission Antihypertensive choice may change pending complete echo Labetalol  5 mg IV every 3 hours as needed for SBP greater than  165, 4 doses ordered  Chart reviewed.   DVT prophylaxis: Heparin GGT Code Status: Full code Diet: Heart healthy Family Communication: Updated son, Jane Willis at [phone] at patient's request Disposition Plan: Pending clinical course; pending complete echo Consults called: Pharmacy Admission status: Telemetry cardiac, inpatient  Past Medical History:  Diagnosis Date   Allergy    Seasonal   Arthritis    Chicken pox    Hyperlipidemia    Hypertension    PONV (postoperative nausea and vomiting)    Urinary incontinence    Past Surgical History:  Procedure Laterality Date   ABDOMINAL HYSTERECTOMY  1986   ANTERIOR LATERAL LUMBAR FUSION WITH PERCUTANEOUS SCREW 1 LEVEL N/A 12/10/2021   Procedure: L4-5 LATERAL INTERBODY FUSION WITH POSTERIOR PERCUTANEOUS FIXATION;  Surgeon: Jodeen Munch, MD;  Location: [facility] ORS;  Service: Neurosurgery;  Laterality: N/A;   APPLICATION OF INTRAOPERATIVE CT SCAN N/A 12/10/2021   Procedure: APPLICATION OF INTRAOPERATIVE CT SCAN;  Surgeon: Jodeen Munch, MD;  Location: [facility] ORS;  Service: Neurosurgery;  Laterality: N/A;   BREAST EXCISIONAL BIOPSY Left 1970's   benign   BREAST SURGERY  1968   CATARACT EXTRACTION  2014/2015   Both eyes   EYE SURGERY     FRACTURE SURGERY  01/2009   Fractured Left ankle, plate on outside of ankle 2 rods through/across ankle from inside of ankle   KNEE ARTHROPLASTY Left 06/04/2023   Procedure: COMPUTER ASSISTED TOTAL KNEE ARTHROPLASTY;  Surgeon: Arlyne Lame, MD;  Location: [facility] ORS;  Service: Orthopedics;  Laterality: Left;   OOPHORECTOMY     TONSILLECTOMY AND ADENOIDECTOMY  1960   Social History:  reports that she has never smoked. She has never used smokeless tobacco. She reports that she does not drink alcohol  and does not use drugs.  Allergies  Allergen Reactions   Gabapentin     Balance issues   Ibuprofen Other (See Comments)    hypertension   Family History  Problem Relation Age of Onset   Arthritis  Mother    Hypertension Mother    Arthritis Father    Hypertension Father    Arthritis Maternal Grandmother    Cancer Brother        Colon   Cancer Maternal Aunt        Breast Cancer   Breast cancer Maternal Aunt    Breast cancer Cousin    Breast cancer Cousin    Hyperlipidemia Son    Hyperlipidemia Son    Family history: Family history reviewed and not pertinent.  Prior to Admission medications   Medication Sig Start Date End Date Taking? Authorizing Provider  acetaminophen  (TYLENOL ) 500 MG tablet Take 1,000 mg by mouth as needed for moderate pain.    [provider]  amLODipine -valsartan  (EXFORGE ) 10-160 MG tablet Take 1 tablet by mouth daily. 12/27/23   Thersia Flax, MD  Ascorbic Acid  (VITAMIN C) 1000 MG tablet Take 1,000 mg by mouth daily.    [provider]  aspirin  EC 81 MG tablet Take 1 tablet (81 mg total) by mouth in the morning and at bedtime. Swallow whole. 06/05/23   Wadie Guile, PA-C  celecoxib  (CELEBREX ) 200 MG capsule Take 1 capsule (200 mg total) by mouth 2 (two) times daily. Patient not taking: Reported on 10/22/2023 06/05/23   Wadie Guile, PA-C  Cholecalciferol  (VITAMIN D -3) 1000 UNITS CAPS Take 2,000 capsules by mouth daily.  [provider]  diclofenac Sodium (VOLTAREN) 1 % GEL Apply 2 g topically 3 (three) times daily as needed. Patient not taking: Reported on 10/22/2023 10/04/23   [provider]  ondansetron  (ZOFRAN ) 4 MG tablet Take 1 tablet (4 mg total) by mouth every 8 (eight) hours as needed for nausea or vomiting. 06/05/23   Wadie Guile, PA-C  oxyCODONE  (OXY IR/ROXICODONE ) 5 MG immediate release tablet Take 1 tablet (5 mg total) by mouth every 4 (four) hours as needed for moderate pain (pain score 4-6). Patient not taking: Reported on 10/22/2023 06/05/23   Wadie Guile, PA-C  Polyethyl Glycol-Propyl Glycol (SYSTANE OP) Place 1 drop into both eyes daily as needed (dry eyes).    [provider]   Psyllium Husk POWD Take 5.8 g by mouth daily. 1 rounded tsp in water daily    [provider]  senna (SENOKOT) 8.6 MG TABS tablet Take 1 tablet (8.6 mg total) by mouth daily as needed for mild constipation. 12/12/21   Noble Bateman, PA  traMADol  (ULTRAM ) 50 MG tablet Take 1-2 tablets (50-100 mg total) by mouth every 4 (four) hours as needed for moderate pain. 06/05/23   Wadie Guile, PA-C  vitamin B-12 (CYANOCOBALAMIN ) 1000 MCG tablet Take 2,000 mcg by mouth daily. Patient not taking: Reported on 10/22/2023 10/23/19   [provider]   Physical Exam: Vitals:   02/28/24 1733 02/28/24 1759 02/28/24 1809 02/28/24 1925  BP:  (!) 108/90 118/89   Pulse: (!) 120 (!) 105    Resp:  20    Temp:    98.3 F (36.8 C)  TempSrc:    Oral  SpO2:  95%    Weight:      Height:       Constitutional: appears age-appropriate, anxious Eyes: PERRL, lids and conjunctivae normal ENMT: Mucous membranes are moist. Posterior pharynx clear of any exudate or lesions. Age-appropriate dentition. Hearing appropriate Neck: normal, supple, no masses, no thyromegaly Respiratory: clear to auscultation bilaterally, no wheezing, no crackles. Normal respiratory effort. No accessory muscle use.  Cardiovascular: Irregular rate and irregular rhythm, no murmurs / rubs / gallops.  Bilateral lower extremity pitting 2+ edema. 2+ pedal pulses. No carotid bruits.  Abdomen: no tenderness, no masses palpated, no hepatosplenomegaly. Bowel sounds positive.  Musculoskeletal: no clubbing / cyanosis. No joint deformity upper and lower extremities. Good ROM, no contractures, no atrophy. Normal muscle tone.  Skin: no rashes, lesions, ulcers. No induration Neurologic: Sensation intact. Strength 5/5 in all 4.  Psychiatric: Normal judgment and insight. Alert and oriented x 3.  Anxious mood.   EKG: independently reviewed, showing atrial fibrillation with rate of 140, rapid ventricular response pattern, QTc 421  Chest x-ray  on Admission: I personally reviewed and I agree with radiologist reading as below.  DG Chest 2 View Result Date: 02/28/2024 CLINICAL DATA:  Shortness of breath for 3 weeks EXAM: CHEST - 2 VIEW COMPARISON:  None Available. FINDINGS: Normal cardiopericardial silhouette. Calcified aorta. Small pleural effusions. No pneumothorax. No consolidation. There is some interstitial changes seen lungs which could be chronic. Please correlate with any prior. Degenerative changes of the spine. IMPRESSION: Underinflation.  Small pleural effusions. Mild interstitial prominence. These very well could be chronic. Please correlate with any prior. Electronically Signed   By: Adrianna Horde M.D.   On: 02/28/2024 15:25   Labs on Admission: I have personally reviewed following labs  CBC: Recent Labs  Lab 02/28/24 1458  WBC 5.9  HGB 12.0  HCT 37.6  MCV 89.7  PLT 268   Basic Metabolic Panel: Recent Labs  Lab 02/28/24 1458  NA 135  K 4.2  CL 103  CO2 22  GLUCOSE 103*  BUN 20  CREATININE 0.87  CALCIUM 8.8*  MG 2.1   GFR: Estimated Creatinine Clearance: 44.5 mL/min (by C-G formula based on SCr of 0.87 mg/dL).  Urine analysis:    Component Value Date/Time   COLORURINE YELLOW (A) 05/24/2023 1400   APPEARANCEUR CLOUDY (A) 05/24/2023 1400   LABSPEC 1.014 05/24/2023 1400   PHURINE 5.0 05/24/2023 1400   GLUCOSEU NEGATIVE 05/24/2023 1400   HGBUR NEGATIVE 05/24/2023 1400   BILIRUBINUR NEGATIVE 05/24/2023 1400   KETONESUR NEGATIVE 05/24/2023 1400   PROTEINUR NEGATIVE 05/24/2023 1400   NITRITE POSITIVE (A) 05/24/2023 1400   LEUKOCYTESUR LARGE (A) 05/24/2023 1400   CRITICAL CARE Performed by: Dr. Reinhold Carbine  Total critical care time: 32 minutes  Critical care time was exclusive of separately billable procedures and treating other patients.  Critical care was necessary to treat or prevent imminent or life-threatening deterioration.  Critical care was time spent personally by me on the following activities:  development of treatment plan with patient as well as nursing, discussions with consultants, evaluation of patient's response to treatment, examination of patient, obtaining history from patient or surrogate, ordering and performing treatments and interventions, ordering and review of laboratory studies, ordering and review of radiographic studies, pulse oximetry and re-evaluation of patient's condition.  This document was prepared using Dragon Voice Recognition software and may include unintentional dictation errors.  Dr. Reinhold Carbine [facility]  If 7PM-7AM, please contact overnight-coverage provider If 7AM-7PM, please contact day attending provider [email or web address]  02/28/2024, 9:00 PM

## 2024-02-28 NOTE — ED Triage Notes (Signed)
 Patient to ED via POV from [facility] for SOB. Ongoing x3 weeks. Also having bilateral leg swelling. Reports a 15 lb weight gain since January. Pt states more SOB when walking.

## 2024-02-28 NOTE — ED Provider Notes (Addendum)
 [facility] Provider Note    Event Date/Time   First MD Initiated Contact with Patient 02/28/24 1614     (approximate)   History   Shortness of Breath   HPI  Jane Willis is a 83 year old female presenting to the emergency department for evaluation of shortness of breath.  About 3 weeks ago patient began to notice some cough and congestion which she initially thought was related to allergies.  Has been ongoing since that time.  Also notes bilateral leg swelling with 15 pound weight gain and exertional shortness of breath.  No known history of heart or lung problems.  Denies history of similar episodes in the past.     Physical Exam   Triage Vital Signs: ED Triage Vitals  Encounter Vitals Group     BP 02/28/24 1453 (!) 117/95     Systolic BP Percentile --      Diastolic BP Percentile --      Pulse Rate 02/28/24 1453 (!) 103     Resp 02/28/24 1453 17     Temp 02/28/24 1453 98.3 F (36.8 C)     Temp Source 02/28/24 1453 Oral     SpO2 02/28/24 1453 93 %     Weight 02/28/24 1451 150 lb (68 kg)     Height 02/28/24 1451 5\' 3"  (1.6 m)     Head Circumference --      Peak Flow --      Pain Score 02/28/24 1451 0     Pain Loc --      Pain Education --      Exclude from Growth Chart --     Most recent vital signs: Vitals:   02/28/24 1733 02/28/24 1759  BP:  (!) 108/90  Pulse: (!) 120 (!) 105  Resp:  20  Temp:    SpO2:  95%     General: Awake, interactive  CV:  Tachycardic with irregularly irregular rhythm, normal peripheral perfusion Resp:  Unlabored respirations, lung sounds diminished with bibasilar crackles Abd:  Nondistended, soft, nontender Neuro:  Symmetric facial movement, fluid speech MSK:  Bilateral lower extremity pitting edema   ED Results / Procedures / Treatments   Labs (all labs ordered are listed, but only abnormal results are displayed) Labs Reviewed  BASIC METABOLIC PANEL WITH GFR - Abnormal; Notable for the  following components:      Result Value   Glucose, Bld 103 (*)    Calcium 8.8 (*)    All other components within normal limits  BRAIN NATRIURETIC PEPTIDE - Abnormal; Notable for the following components:   B Natriuretic Peptide 659.4 (*)    All other components within normal limits  CBC  MAGNESIUM   TROPONIN I (HIGH SENSITIVITY)     EKG EKG independently reviewed interpreted by myself (ER attending) demonstrates:    RADIOLOGY Imaging independently reviewed and interpreted by myself demonstrates:  CXR with interstitial prominence that I suspect is likely related to pulmonary edema  Formal Radiology Read:  DG Chest 2 View Result Date: 02/28/2024 CLINICAL DATA:  Shortness of breath for 3 weeks EXAM: CHEST - 2 VIEW COMPARISON:  None Available. FINDINGS: Normal cardiopericardial silhouette. Calcified aorta. Small pleural effusions. No pneumothorax. No consolidation. There is some interstitial changes seen lungs which could be chronic. Please correlate with any prior. Degenerative changes of the spine. IMPRESSION: Underinflation.  Small pleural effusions. Mild interstitial prominence. These very well could be chronic. Please correlate with any prior. Electronically Signed   By: Alice Innocent  Venice Gillis M.D.   On: 02/28/2024 15:25    PROCEDURES:  Critical Care performed: Yes, see critical care procedure note(s)  CRITICAL CARE Performed by: Claria Crofts   Total critical care time: 31 minutes  Critical care time was exclusive of separately billable procedures and treating other patients.  Critical care was necessary to treat or prevent imminent or life-threatening deterioration.  Critical care was time spent personally by me on the following activities: development of treatment plan with patient and/or surrogate as well as nursing, discussions with consultants, evaluation of patient's response to treatment, examination of patient, obtaining history from patient or surrogate, ordering and performing  treatments and interventions, ordering and review of laboratory studies, ordering and review of radiographic studies, pulse oximetry and re-evaluation of patient's condition.   Procedures   MEDICATIONS ORDERED IN ED: Medications  furosemide (LASIX) injection 40 mg (40 mg Intravenous Given 02/28/24 1644)  metoprolol tartrate (LOPRESSOR) injection 5 mg (5 mg Intravenous Given 02/28/24 1640)     IMPRESSION / MDM / ASSESSMENT AND PLAN / ED COURSE  I reviewed the triage vital signs and the nursing notes.  Differential diagnosis includes, but is not limited to, new onset CHF precipitating new onset A-fib, anemia, electrolyte abnormality, pneumonia, ACS  Patient's presentation is most consistent with acute presentation with potential threat to life or bodily function.  83 year old female presenting to the emergency department for evaluation of shortness of breath.  Tachycardic with heart rates in the 140s-160s on presentation noted to be in A-fib.  Patient denies known history of this.  Clinical exam concerning for CHF exacerbation.  BNP resulted elevated at 659.  Chest x-Mersadie Kavanaugh with interstitial prominence.  In the setting of CHF exacerbation, will avoid calcium channel blockers.  Will trial IV metoprolol.  Clinical Course as of 02/28/24 1801  Mon Feb 28, 2024  1755 Patient reassessed.  Heart rate improved to the low 100s.  BP stable most recently 108/85.  Patient does report she has gone to the bathroom multiple times since getting Lasix.  Will reach out to hospitalist team to discuss admission. [NR]    Clinical Course User Index [NR] Claria Crofts, MD   Case discussed with Dr. Reinhold Carbine.  She will evaluate for anticipated admission.  FINAL CLINICAL IMPRESSION(S) / ED DIAGNOSES   Final diagnoses:  Acute on chronic congestive heart failure, unspecified heart failure type (HCC)  Atrial fibrillation with rapid ventricular response (HCC)     Rx / DC Orders   ED Discharge Orders     None         Note:  This document was prepared using Dragon voice recognition software and may include unintentional dictation errors.   Claria Crofts, MD 02/28/24 Mervyn Ace, MD 02/28/24 [phone]

## 2024-02-28 NOTE — Assessment & Plan Note (Addendum)
 Home amlodipine -valsartan  10-160 milligrams daily not resumed on admission Antihypertensive choice may change pending complete echo Labetalol  5 mg IV every 3 hours as needed for SBP greater than 165, 4 doses ordered

## 2024-02-28 NOTE — Assessment & Plan Note (Signed)
 Complete echo ordered Strict I's and O's AM team to consult cardiology pending complete echo if needed

## 2024-02-28 NOTE — Assessment & Plan Note (Addendum)
 No prior history of atrial fibrillation Unclear how long patient has been in atrial fibrillation at this time Suspect secondary to new onset heart failure Metoprolol 5 mg IV every 4 hours as needed for heart rate greater than 120; will avoid cardioversion on admission pending complete echo Complete echo ordered

## 2024-02-28 NOTE — ED Notes (Signed)
 Lab notified of add-on magnesium  and troponin

## 2024-02-29 ENCOUNTER — Telehealth (HOSPITAL_COMMUNITY): Payer: Self-pay | Admitting: Pharmacy Technician

## 2024-02-29 ENCOUNTER — Other Ambulatory Visit (HOSPITAL_COMMUNITY): Payer: Self-pay

## 2024-02-29 DIAGNOSIS — I4891 Unspecified atrial fibrillation: Secondary | ICD-10-CM

## 2024-02-29 LAB — BASIC METABOLIC PANEL WITH GFR
Anion gap: 9 (ref 5–15)
BUN: 20 mg/dL (ref 8–23)
CO2: 24 mmol/L (ref 22–32)
Calcium: 8.7 mg/dL — ABNORMAL LOW (ref 8.9–10.3)
Chloride: 105 mmol/L (ref 98–111)
Creatinine, Ser: 1.01 mg/dL — ABNORMAL HIGH (ref 0.44–1.00)
GFR, Estimated: 55 mL/min — ABNORMAL LOW (ref >60)
Glucose, Bld: 96 mg/dL (ref 70–99)
Potassium: 3.8 mmol/L (ref 3.5–5.1)
Sodium: 138 mmol/L (ref 135–145)

## 2024-02-29 LAB — CBC
HCT: 34.5 % — ABNORMAL LOW (ref 36.0–46.0)
Hemoglobin: 11 g/dL — ABNORMAL LOW (ref 12.0–15.0)
MCH: 28.1 pg (ref 26.0–34.0)
MCHC: 31.9 g/dL (ref 30.0–36.0)
MCV: 88.2 fL (ref 80.0–100.0)
Platelets: 241 10*3/uL (ref 150–400)
RBC: 3.91 MIL/uL (ref 3.87–5.11)
RDW: 14.4 % (ref 11.5–15.5)
WBC: 6.3 10*3/uL (ref 4.0–10.5)
nRBC: 0 % (ref 0.0–0.2)

## 2024-02-29 LAB — HEPARIN LEVEL (UNFRACTIONATED): Heparin Unfractionated: 0.77 [IU]/mL — ABNORMAL HIGH (ref 0.30–0.70)

## 2024-02-29 LAB — TSH: TSH: 3.701 u[IU]/mL (ref 0.350–4.500)

## 2024-02-29 LAB — MAGNESIUM: Magnesium: 2.1 mg/dL (ref 1.7–2.4)

## 2024-02-29 MED ORDER — IPRATROPIUM BROMIDE 0.02 % IN SOLN
0.5000 mg | Freq: Once | RESPIRATORY_TRACT | Status: AC
  Administered 2024-02-29: 0.5 mg via RESPIRATORY_TRACT
  Filled 2024-02-29: qty 2.5

## 2024-02-29 MED ORDER — DM-GUAIFENESIN ER 30-600 MG PO TB12
1.0000 | ORAL_TABLET | Freq: Two times a day (BID) | ORAL | Status: DC
  Administered 2024-02-29 – 2024-03-03 (×7): 1 via ORAL
  Filled 2024-02-29 (×7): qty 1

## 2024-02-29 MED ORDER — METOPROLOL TARTRATE 25 MG PO TABS
25.0000 mg | ORAL_TABLET | Freq: Two times a day (BID) | ORAL | Status: DC
Start: 2024-02-29 — End: 2024-03-01
  Administered 2024-02-29 (×2): 25 mg via ORAL
  Filled 2024-02-29 (×2): qty 1

## 2024-02-29 MED ORDER — APIXABAN 5 MG PO TABS
5.0000 mg | ORAL_TABLET | Freq: Two times a day (BID) | ORAL | Status: DC
  Administered 2024-02-29 – 2024-03-03 (×7): 5 mg via ORAL
  Filled 2024-02-29 (×2): qty 1
  Filled 2024-02-29: qty 2
  Filled 2024-02-29 (×4): qty 1

## 2024-02-29 MED ORDER — HYDROCOD POLI-CHLORPHE POLI ER 10-8 MG/5ML PO SUER: 5.0000 mL | Freq: Every evening | ORAL | Status: DC | PRN

## 2024-02-29 NOTE — Progress Notes (Signed)
 Progress Note   Patient: Jane Willis XBJ:[id number] DOB: [birth year] DOA: 02/28/2024     1 DOS: the patient was seen and examined on 02/29/2024   Brief hospital course: [name] is a 83 year old female with history of hypertension, history of left total knee arthroplasty, who presents emergency department for chief concerns of shortness of breath from [place] [facility].  Vitals in the ED showed T of 98, rr 17, hr 103, blood pressure 117/95, SpO2 of 93% on room air.  Serum sodium is 135, potassium 4.2, chloride 103, bicarb 22, BUN of 20, serum creatinine of 2.87, EGFR greater than 60, nonfasting blood glucose 103, WBC 5.9, hemoglobin 12, platelets of 268.  BNP was elevated at 659.4.  ED treatment: Furosemide 40 mg IV one-time dose, metoprolol 5 mg IV one-time dose.  Assessment and Plan: * New onset of congestive heart failure (HCC) No prior history of CHF. Presenting with symptoms c/f CHF and with elevated BNP. Likely secondary to arrhythmia.  F/u AM TTE Continue IV lasix  Strict I's and O's      Atrial fibrillation with RVR c/b new onset CHF Unclear what precipitated patient's atrial fibrillation. She did recently have some cold like symptoms. No other infectious symptoms. ACS not likely without symptoms and troponin trend flat and negative. Possibly CHF lead to afib.  CHA2DS2VASc = 5 Heparin gtt. Initiated Beta blocker to help with rate control  C/s cardiology for further assistance with rate management and new onset HF   Chest pressure Resolved.  Patient denied any chest discomfort this AM. Low suspicion for ACS. Likely related to arrhythmia. F/u TTE Risk modification for ACS  Hypertension Home amlodipine -valsartan  10-160 milligrams daily not resumed on admission Continue metoprolol only given mostly normal blood pressure   HLD Patient not currently on statin. Given age most likely.        Subjective:   Patient reports for the past 3 weeks shortsness  of breath and cold like symptoms, noting increased coughing and coughing up yellowish sputum that eventually turned whitish.   Since that time, she has had to sleep in a recliner because she couldn't lie flat. She states this has never happened before. She is usually able to walk around her house and around grocery store without difficulty.  She notes she now has mild shortness of breath walking to restroom which improves with rest.  She denies chest pain.   Finally over this 3 week period patient noticed increased swelling in legs. And when she went to the ED she noticed her weight was up 15lbs.  Not aware of palpitations.   HTN amldopine, recently added valsartan .  No MI or CVA.   HLD: Not currently on medications.   No smoking ever No T2DM.  Constipation.  Small infrequent stools.  Physical Exam: Vitals:   02/29/24 0507 02/29/24 0530 02/29/24 0830 02/29/24 0853  BP: 112/78 114/78 127/85   Pulse: (!) 109 (!) 104 (!) 151   Resp: (!) 21 20 (!) 26   Temp:    98.1 F (36.7 C)  TempSrc:    Oral  SpO2: 93% 93% 97%   Weight:      Height:       Physical Exam  Constitutional: In no distress.  Cardiovascular: Irregularly irregular 2+ bilateral lower extremity edema. Elevated JVP to angle of jaw Pulmonary: Non labored breathing on room air, NO rales, diffuse wheezing. Abdominal: Soft. Normal bowel sounds. Non distended and non tender Musculoskeletal: Normal range of motion.  Neurological: Alert and oriented to person, place, and time. Non focal  Skin: Skin is warm and dry.   Data Reviewed:  EKG atrial fibrillation with RVR Scr 1.01<<0.87  Family Communication: Discussed plan with patient at length and she stated that she understood.   Disposition: Status is: Inpatient Remains inpatient appropriate because: Remains in afib with RVR  Planned Discharge Destination: Home    Time spent: 35 minutes  Author: Joette Mustard, MD 02/29/2024 9:28 AM  For on call review  [email or web address].

## 2024-02-29 NOTE — Telephone Encounter (Signed)
 Patient Product/process development scientist completed.    The patient is insured through [facility]. Patient has Medicare and is not eligible for a copay card, but may be able to apply for patient assistance or Medicare RX Payment Plan (Patient Must reach out to their plan, if eligible for payment plan), if available.    Ran test claim for Eliquis 5 mg and the current 30 day co-pay is $518.27 due to a deductible.   This test claim was processed through  [facility]- copay amounts may vary at other pharmacies due to pharmacy/plan contracts, or as the patient moves through the different stages of their insurance plan.     Morgan Arab, CPHT Pharmacy Technician III Certified Patient Advocate [facility] Patient Advocate Team Direct Number: [phone]  Fax: [phone]

## 2024-02-29 NOTE — Progress Notes (Signed)
 PHARMACY - ANTICOAGULATION CONSULT NOTE  Pharmacy Consult for Heparin Infusion Indication: atrial fibrillation  Allergies  Allergen Reactions   Gabapentin     Balance issues   Ibuprofen Other (See Comments)    hypertension    Patient Measurements: Height: 5\' 3"  (160 cm) Weight: 68 kg (150 lb) IBW/kg (Calculated) : 52.4 HEPARIN DW (KG): 66.3  Vital Signs: Temp: 98.2 F (36.8 C) (05/13 0445) Temp Source: Oral (05/13 0445) BP: 112/78 (05/13 0507) Pulse Rate: 109 (05/13 0507)  Labs: Recent Labs    02/28/24 1458 02/28/24 1945 02/28/24 2129 02/29/24 0445  HGB 12.0  --   --  11.0*  HCT 37.6  --   --  34.5*  PLT 268  --   --  241  APTT  --  30  --   --   LABPROT  --  14.3  --   --   INR  --  1.1  --   --   HEPARINUNFRC  --   --   --  0.77*  CREATININE 0.87  --   --  1.01*  TROPONINIHS 8  --  8  --     Estimated Creatinine Clearance: 38.4 mL/min (A) (by C-G formula based on SCr of 1.01 mg/dL (H)).   Medical History: Past Medical History:  Diagnosis Date   Allergy    Seasonal   Arthritis    Chicken pox    Hyperlipidemia    Hypertension    PONV (postoperative nausea and vomiting)    Urinary incontinence     Medications:  Not on anticoagulation per chart review  Assessment: Patient presented to ED for SOB that has been ongoing for 3 weeks. In the ED, patient was tachycardic with HRs in the 140-160s and noted to be in Afib. Pharmacy has been consulted to initiate patient on a heparin infusion.   Baseline INR and aPTT ordered.  No signs/symptoms of bleeding per chart review. Hgb 12. PLT 268.  Goal of Therapy:  Heparin level 0.3-0.7 units/ml Monitor platelets by anticoagulation protocol: Yes  05/13 0445 HL 0.77, supratherapeutic   Plan:  - Decrease heparin infusion rate to 800 units/hr - Recheck heparin level in 8 hours after rate change - Monitor CBC daily while on heparin  [name], PharmD, [facility] 02/29/2024 5:38 AM

## 2024-02-29 NOTE — Progress Notes (Signed)
 Heart Failure Navigator Progress Note  Assessed for Heart & Vascular TOC clinic readiness.  Does not meet criteria due to current [facility] patient.   Navigator will sign off at this time.  Roxy Horseman, RN, BSN [facility] Heart Failure Navigator Secure Chat Only

## 2024-02-29 NOTE — Consult Note (Signed)
 [place] [facility] CLINIC CARDIOLOGY CONSULT NOTE       Patient ID: Jane Willis MRN: [id number] DOB/AGE: October 23, 1940 83 y.o.  Admit date: 02/28/2024 Referring Physician Dr. Joette Mustard Primary Physician Bair, Randa Burton, MD Primary Cardiologist None Reason for Consultation Acute heart failure, AF RVR  HPI: Jane Willis is a 83 y.o. female  with a past medical history of hypertension with no known history of heart failure or atrial fibrillation who presented to the ED on 02/28/2024 for shortness of breath, orthopnea, lower extremity swelling and cough. Cardiology was consulted for further evaluation.   Patient presented to the ED with cough, SOB, orthopnea and lower extremity swelling that progressively worsened over past 3 weeks. Work up in the ED notable for K 4.2, na 135, Mg 2.1, Cr 0.87, Hgb 12, plts 268. BNP elevated at 659. Trops negative x2 (8, 8). EKG in EED with atrial fibrillation rate 140 bpm. TSH within normal limits. CXR with small pleural effusions. Patient received IV lasix 40 mg x2, 1x IV metoprolol tartrate 5 mg and started on heparin gtt.   At the time of my evaluation this afternoon, patient was resting comfortably in ED stretcher in no acute distress. We discussed her symptoms in further detail.  Patient reports she has had worsening shortness of breath, lower extremity swelling and orthopnea for the past 3 weeks.  Patient denies any chest pain, palpitations, dizziness.  Patient states her shortness of breath has improved since yesterday s/p IV Lasix 40 mg x2.  Patient states she has no history of heart failure, A-fib, coronary artery disease or OSA. Patient denies alcohol  or tobacco use.    Review of systems complete and found to be negative unless listed above    Past Medical History:  Diagnosis Date   Allergy    Seasonal   Arthritis    Chicken pox    Hyperlipidemia    Hypertension    PONV (postoperative nausea and vomiting)    Urinary incontinence     Past  Surgical History:  Procedure Laterality Date   ABDOMINAL HYSTERECTOMY  1986   ANTERIOR LATERAL LUMBAR FUSION WITH PERCUTANEOUS SCREW 1 LEVEL N/A 12/10/2021   Procedure: L4-5 LATERAL INTERBODY FUSION WITH POSTERIOR PERCUTANEOUS FIXATION;  Surgeon: Jodeen Munch, MD;  Location: [facility] ORS;  Service: Neurosurgery;  Laterality: N/A;   APPLICATION OF INTRAOPERATIVE CT SCAN N/A 12/10/2021   Procedure: APPLICATION OF INTRAOPERATIVE CT SCAN;  Surgeon: Jodeen Munch, MD;  Location: [facility] ORS;  Service: Neurosurgery;  Laterality: N/A;   BREAST EXCISIONAL BIOPSY Left 1970's   benign   BREAST SURGERY  1968   CATARACT EXTRACTION  2014/2015   Both eyes   EYE SURGERY     FRACTURE SURGERY  01/2009   Fractured Left ankle, plate on outside of ankle 2 rods through/across ankle from inside of ankle   KNEE ARTHROPLASTY Left 06/04/2023   Procedure: COMPUTER ASSISTED TOTAL KNEE ARTHROPLASTY;  Surgeon: Arlyne Lame, MD;  Location: [facility] ORS;  Service: Orthopedics;  Laterality: Left;   OOPHORECTOMY     TONSILLECTOMY AND ADENOIDECTOMY  1960    (Not in a hospital admission)  Social History   Socioeconomic History   Marital status: Widowed    Spouse name: Not on file   Number of children: Not on file   Years of education: Not on file   Highest education level: Not on file  Occupational History   Not on file  Tobacco Use   Smoking status: Never   Smokeless tobacco: Never  Vaping Use   Vaping status: Never Used  Substance and Sexual Activity   Alcohol  use: No    Alcohol /week: 0.0 standard drinks of alcohol    Drug use: No   Sexual activity: Never    Partners: Male  Other Topics Concern   Not on file  Social History Narrative   Widowed   Retired   Children 3   Pets: none   Caffeine- Coffee, Tea, rare soda   Social Drivers of Corporate investment banker Strain: Low Risk  (12/07/2023)   Received from [facility]   Overall Financial Resource Strain (CARDIA)    Difficulty  of Paying Living Expenses: Not hard at all  Food Insecurity: No Food Insecurity (02/29/2024)   Hunger Vital Sign    Worried About Running Out of Food in the Last Year: Never true    Ran Out of Food in the Last Year: Never true  Transportation Needs: No Transportation Needs (02/29/2024)   PRAPARE - Administrator, Civil Service (Medical): No    Lack of Transportation (Non-Medical): No  Physical Activity: Inactive (03/11/2023)   Exercise Vital Sign    Days of Exercise per Week: 0 days    Minutes of Exercise per Session: 0 min  Stress: No Stress Concern Present (03/11/2023)   [employer] of Occupational Health - Occupational Stress Questionnaire    Feeling of Stress : Not at all  Social Connections: Moderately Integrated (02/29/2024)   Social Connection and Isolation Panel [NHANES]    Frequency of Communication with Friends and Family: More than three times a week    Frequency of Social Gatherings with Friends and Family: Twice a week    Attends Religious Services: More than 4 times per year    Active Member of [employer] or Organizations: Yes    Attends Banker Meetings: More than 4 times per year    Marital Status: Widowed  Intimate Partner Violence: Not At Risk (02/29/2024)   Humiliation, Afraid, Rape, and Kick questionnaire    Fear of Current or Ex-Partner: No    Emotionally Abused: No    Physically Abused: No    Sexually Abused: No    Family History  Problem Relation Age of Onset   Arthritis Mother    Hypertension Mother    Arthritis Father    Hypertension Father    Arthritis Maternal Grandmother    Cancer Brother        Colon   Cancer Maternal Aunt        Breast Cancer   Breast cancer Maternal Aunt    Breast cancer Cousin    Breast cancer Cousin    Hyperlipidemia Son    Hyperlipidemia Son      Vitals:   02/29/24 0530 02/29/24 0830 02/29/24 0853 02/29/24 1130  BP: 114/78 127/85  101/61  Pulse: (!) 104 (!) 151  (!) 110  Resp: 20 (!) 26  (!) 21   Temp:   98.1 F (36.7 C)   TempSrc:   Oral   SpO2: 93% 97%  93%  Weight:      Height:        PHYSICAL EXAM General: Well-appearing elderly female, well nourished, in no acute distress. HEENT: Normocephalic and atraumatic. Neck: No JVD.   Lungs: Normal respiratory effort on room air. Clear bilaterally to auscultation. No wheezes, crackles, rhonchi.  Heart: irregularly irregular, elevated HR. Normal S1 and S2 without gallops or murmurs.  Abdomen: Non-distended appearing.  Msk: Normal strength and  tone for age. Extremities: Warm and well perfused. No clubbing, cyanosis. 2+ pitting edema bilaterally.  Neuro: Alert and oriented X 3. Psych: Answers questions appropriately.   Labs: Basic Metabolic Panel: Recent Labs    02/28/24 1458 02/29/24 0445  NA 135 138  K 4.2 3.8  CL 103 105  CO2 22 24  GLUCOSE 103* 96  BUN 20 20  CREATININE 0.87 1.01*  CALCIUM 8.8* 8.7*  MG 2.1 2.1   Liver Function Tests: No results for input(s): "AST", "ALT", "ALKPHOS", "BILITOT", "PROT", "ALBUMIN" in the last 72 hours. No results for input(s): "LIPASE", "AMYLASE" in the last 72 hours. CBC: Recent Labs    02/28/24 1458 02/29/24 0445  WBC 5.9 6.3  HGB 12.0 11.0*  HCT 37.6 34.5*  MCV 89.7 88.2  PLT 268 241   Cardiac Enzymes: Recent Labs    02/28/24 1458 02/28/24 2129  TROPONINIHS 8 8   BNP: Recent Labs    02/28/24 1458  BNP 659.4*   D-Dimer: No results for input(s): "DDIMER" in the last 72 hours. Hemoglobin A1C: No results for input(s): "HGBA1C" in the last 72 hours. Fasting Lipid Panel: No results for input(s): "CHOL", "HDL", "LDLCALC", "TRIG", "CHOLHDL", "LDLDIRECT" in the last 72 hours. Thyroid  Function Tests: Recent Labs    02/29/24 0445  TSH 3.701   Anemia Panel: No results for input(s): "VITAMINB12", "FOLATE", "FERRITIN", "TIBC", "IRON", "RETICCTPCT" in the last 72 hours.   Radiology: DG Chest 2 View Result Date: 02/28/2024 CLINICAL DATA:  Shortness of breath for 3  weeks EXAM: CHEST - 2 VIEW COMPARISON:  None Available. FINDINGS: Normal cardiopericardial silhouette. Calcified aorta. Small pleural effusions. No pneumothorax. No consolidation. There is some interstitial changes seen lungs which could be chronic. Please correlate with any prior. Degenerative changes of the spine. IMPRESSION: Underinflation.  Small pleural effusions. Mild interstitial prominence. These very well could be chronic. Please correlate with any prior. Electronically Signed   By: Adrianna Horde M.D.   On: 02/28/2024 15:25    ECHO ordered  TELEMETRY reviewed by me 02/29/2024: atrial fibrillation, rate 130s - 140s  EKG reviewed by me: Atrial fibrillation, rate 140 bpm  Data reviewed by me 02/29/2024: last 24h vitals tele labs imaging I/O ED provider note, admission H&P.  Principal Problem:   New onset of congestive heart failure (HCC) Active Problems:   Hypertension   Prediabetes   Total knee replacement status   Atrial fibrillation with RVR (HCC)   New onset atrial fibrillation (HCC)   Chest pressure    ASSESSMENT AND PLAN:  Jane Willis is a 83 y.o. female  with a past medical history of hypertension with no known history of heart failure or atrial fibrillation who presented to the ED on 02/28/2024 for shortness of breath, orthopnea, lower extremity swelling and cough. Cardiology was consulted for further evaluation.   # New onset AF RVR # Acute heart failure  Patient reports to ED with 3 weeks of cough, worsening SOB, orthopnea, and lower extremity edema without chest pain. Patient has no known history of heart failure or AF. EKG in ED with atrial fibrillation rate 140 bpm. Trops negative x2. BNP elevated at 659. HR elevated and BP stable this afternoon. -Echo ordered -CHADS2VASC of 4. Anticoagulation is recommended for stroke risk reduction.  -Start Eliquis 5 mg twice daily for stroke risk reduction. -Start metoprolol tartrate 25 mg twice daily for improved HR  control. -Continue IV lasix 40 mg twice daily. Closely monitor renal function and UOP.  -Goal K >  4, Mag >2  -Further GDMT pending echo results.   This patient's plan of care was discussed and created with Dr. Custovic and she is in agreement.  Signed: Creighton Doffing, PA-C  02/29/2024, 12:59 PM [facility] Card[facility]

## 2024-03-01 ENCOUNTER — Encounter: Payer: Self-pay | Admitting: Internal Medicine

## 2024-03-01 ENCOUNTER — Encounter
Admission: EM | Disposition: A | Discharge status: Home / Self Care | Payer: Self-pay | Source: Ambulatory Visit | Attending: Internal Medicine

## 2024-03-01 ENCOUNTER — Other Ambulatory Visit: Payer: Self-pay

## 2024-03-01 ENCOUNTER — Inpatient Hospital Stay: Admitting: Anesthesiology

## 2024-03-01 ENCOUNTER — Inpatient Hospital Stay: Admit: 2024-03-01 | Discharge: 2024-03-01 | Disposition: A | Discharge status: Home / Self Care | Attending: Student

## 2024-03-01 ENCOUNTER — Inpatient Hospital Stay: Admit: 2024-03-01 | Discharge: 2024-03-01 | Disposition: A | Discharge status: Home / Self Care

## 2024-03-01 DIAGNOSIS — R0609 Other forms of dyspnea: Secondary | ICD-10-CM

## 2024-03-01 DIAGNOSIS — I509 Heart failure, unspecified: Secondary | ICD-10-CM | POA: Diagnosis not present

## 2024-03-01 HISTORY — PX: CARDIOVERSION: SHX1299

## 2024-03-01 HISTORY — PX: TEE WITHOUT CARDIOVERSION: SHX5443

## 2024-03-01 LAB — BASIC METABOLIC PANEL WITH GFR
Anion gap: 9 (ref 5–15)
BUN: 26 mg/dL — ABNORMAL HIGH (ref 8–23)
CO2: 25 mmol/L (ref 22–32)
Calcium: 8.2 mg/dL — ABNORMAL LOW (ref 8.9–10.3)
Chloride: 102 mmol/L (ref 98–111)
Creatinine, Ser: 1.13 mg/dL — ABNORMAL HIGH (ref 0.44–1.00)
GFR, Estimated: 48 mL/min — ABNORMAL LOW (ref >60)
Glucose, Bld: 88 mg/dL (ref 70–99)
Potassium: 3.8 mmol/L (ref 3.5–5.1)
Sodium: 136 mmol/L (ref 135–145)

## 2024-03-01 LAB — ECHOCARDIOGRAM COMPLETE
AR max vel: 2.12 cm2
AV Area VTI: 2.33 cm2
AV Area mean vel: 2.1 cm2
AV Mean grad: 2 mmHg
AV Peak grad: 3.5 mmHg
Ao pk vel: 0.93 m/s
Area-P 1/2: 5.09 cm2
Height: 63 in
MV M vel: 4.72 m/s
MV Peak grad: 89.1 mmHg
Radius: 0.5 cm
S' Lateral: 3.3 cm
Weight: 2366.86 [oz_av]

## 2024-03-01 LAB — CBC
HCT: 34.3 % — ABNORMAL LOW (ref 36.0–46.0)
Hemoglobin: 11.1 g/dL — ABNORMAL LOW (ref 12.0–15.0)
MCH: 28.2 pg (ref 26.0–34.0)
MCHC: 32.4 g/dL (ref 30.0–36.0)
MCV: 87.1 fL (ref 80.0–100.0)
Platelets: 219 10*3/uL (ref 150–400)
RBC: 3.94 MIL/uL (ref 3.87–5.11)
RDW: 14.2 % (ref 11.5–15.5)
WBC: 6.6 10*3/uL (ref 4.0–10.5)
nRBC: 0 % (ref 0.0–0.2)

## 2024-03-01 SURGERY — CARDIOVERSION
Anesthesia: General

## 2024-03-01 MED ORDER — METOPROLOL TARTRATE 50 MG PO TABS: 75.0000 mg | ORAL_TABLET | Freq: Two times a day (BID) | ORAL | Status: DC

## 2024-03-01 MED ORDER — METOPROLOL TARTRATE 5 MG/5ML IV SOLN: 5.0000 mg | INTRAVENOUS | Status: DC | PRN

## 2024-03-01 MED ORDER — LIDOCAINE HCL (PF) 2 % IJ SOLN
INTRAMUSCULAR | Status: AC
Start: 2024-03-01 — End: ?
  Filled 2024-03-01: qty 10

## 2024-03-01 MED ORDER — PROPOFOL 500 MG/50ML IV EMUL
INTRAVENOUS | Status: DC | PRN
  Administered 2024-03-01: 70 mg via INTRAVENOUS
  Administered 2024-03-01: 20 mg via INTRAVENOUS

## 2024-03-01 MED ORDER — PHENYLEPHRINE 80 MCG/ML (10ML) SYRINGE FOR IV PUSH (FOR BLOOD PRESSURE SUPPORT)
PREFILLED_SYRINGE | INTRAVENOUS | Status: AC
  Filled 2024-03-01: qty 10

## 2024-03-01 MED ORDER — LIDOCAINE HCL (CARDIAC) PF 100 MG/5ML IV SOSY
PREFILLED_SYRINGE | INTRAVENOUS | Status: DC | PRN
  Administered 2024-03-01: 100 mg via INTRAVENOUS

## 2024-03-01 MED ORDER — LIDOCAINE VISCOUS HCL 2 % MT SOLN
OROMUCOSAL | Status: AC
  Filled 2024-03-01: qty 15

## 2024-03-01 MED ORDER — SODIUM CHLORIDE 0.9 % IV SOLN: INTRAVENOUS | Status: DC

## 2024-03-01 MED ORDER — BUTAMBEN-TETRACAINE-BENZOCAINE 2-2-14 % EX AERO
INHALATION_SPRAY | CUTANEOUS | Status: AC
  Filled 2024-03-01: qty 5

## 2024-03-01 MED ORDER — METOPROLOL TARTRATE 25 MG PO TABS
25.0000 mg | ORAL_TABLET | Freq: Four times a day (QID) | ORAL | Status: DC
  Administered 2024-03-01 – 2024-03-03 (×9): 25 mg via ORAL
  Filled 2024-03-01 (×9): qty 1

## 2024-03-01 MED ORDER — PROPOFOL 10 MG/ML IV BOLUS
INTRAVENOUS | Status: AC
  Filled 2024-03-01: qty 40

## 2024-03-01 NOTE — Progress Notes (Signed)
*  PRELIMINARY RESULTS* Echocardiogram Echocardiogram Transesophageal has been performed.  Jane Willis 03/01/2024, 12:43 PM

## 2024-03-01 NOTE — Progress Notes (Signed)
 [facility] CLINIC CARDIOLOGY PROGRESS NOTE       Patient ID: Jane Willis MRN: [id number] DOB/AGE: July 02, 1941 83 y.o.  Admit date: 02/28/2024 Referring Physician Dr. Joette Mustard Primary Physician Bair, Randa Burton, MD Primary Cardiologist None Reason for Consultation Acute heart failure, AF RVR  HPI: Jane Willis is a 83 y.o. female  with a past medical history of hypertension with no known history of heart failure or atrial fibrillation who presented to the ED on 02/28/2024 for shortness of breath, orthopnea, lower extremity swelling and cough. Cardiology was consulted for further evaluation.   Interval History: -Patient seen and examined this AM and laying comfortably in hospital bed at a slight incline with family at bedside. Patient states she's feeling well and reports improvement in breathing, LE edema, and orthopnea. -Patients BP stable and HR elevated overnight. Per tele patient remains AF RVR 120- 130s. -Pt reports good UOP yesterday. Stable renal function -Electrolytes are stable.  -Patient remains on room air with stable SpO2.  -Hgb stable this AM. -Plan for TEE/DCCV today at 12 PM with Dr. Custovic. Patient agreeable to proceeding.     Review of systems complete and found to be negative unless listed above    Past Medical History:  Diagnosis Date   Allergy    Seasonal   Arthritis    Chicken pox    Hyperlipidemia    Hypertension    PONV (postoperative nausea and vomiting)    Urinary incontinence     Past Surgical History:  Procedure Laterality Date   ABDOMINAL HYSTERECTOMY  1986   ANTERIOR LATERAL LUMBAR FUSION WITH PERCUTANEOUS SCREW 1 LEVEL N/A 12/10/2021   Procedure: L4-5 LATERAL INTERBODY FUSION WITH POSTERIOR PERCUTANEOUS FIXATION;  Surgeon: Jodeen Munch, MD;  Location: [facility] ORS;  Service: Neurosurgery;  Laterality: N/A;   APPLICATION OF INTRAOPERATIVE CT SCAN N/A 12/10/2021   Procedure: APPLICATION OF INTRAOPERATIVE CT SCAN;  Surgeon:  Jodeen Munch, MD;  Location: [facility] ORS;  Service: Neurosurgery;  Laterality: N/A;   BREAST EXCISIONAL BIOPSY Left 1970's   benign   BREAST SURGERY  1968   CATARACT EXTRACTION  2014/2015   Both eyes   EYE SURGERY     FRACTURE SURGERY  01/2009   Fractured Left ankle, plate on outside of ankle 2 rods through/across ankle from inside of ankle   KNEE ARTHROPLASTY Left 06/04/2023   Procedure: COMPUTER ASSISTED TOTAL KNEE ARTHROPLASTY;  Surgeon: Arlyne Lame, MD;  Location: [facility] ORS;  Service: Orthopedics;  Laterality: Left;   OOPHORECTOMY     TONSILLECTOMY AND ADENOIDECTOMY  1960    Medications Prior to Admission  Medication Sig Dispense Refill Last Dose/Taking   acetaminophen  (TYLENOL ) 500 MG tablet Take 1,000 mg by mouth as needed for moderate pain.   Taking As Needed   amLODipine -valsartan  (EXFORGE ) 10-160 MG tablet Take 1 tablet by mouth daily. 90 tablet 0 02/28/2024   aspirin  EC 81 MG tablet Take 1 tablet (81 mg total) by mouth in the morning and at bedtime. Swallow whole.   02/28/2024   Ascorbic Acid  (VITAMIN C) 1000 MG tablet Take 1,000 mg by mouth daily. (Patient not taking: Reported on 02/28/2024)   Not Taking   celecoxib  (CELEBREX ) 200 MG capsule Take 1 capsule (200 mg total) by mouth 2 (two) times daily. (Patient not taking: Reported on 10/22/2023) 60 capsule 1 Not Taking   Cholecalciferol  (VITAMIN D -3) 1000 UNITS CAPS Take 2,000 capsules by mouth daily. (Patient not taking: Reported on 02/28/2024)   Not Taking   diclofenac Sodium (  VOLTAREN) 1 % GEL Apply 2 g topically 3 (three) times daily as needed. (Patient not taking: Reported on 10/22/2023)   Not Taking   ondansetron  (ZOFRAN ) 4 MG tablet Take 1 tablet (4 mg total) by mouth every 8 (eight) hours as needed for nausea or vomiting. (Patient not taking: Reported on 02/28/2024) 30 tablet 0 Not Taking   oxyCODONE  (OXY IR/ROXICODONE ) 5 MG immediate release tablet Take 1 tablet (5 mg total) by mouth every 4 (four) hours as needed for moderate  pain (pain score 4-6). (Patient not taking: Reported on 10/22/2023) 30 tablet 0 Not Taking   Polyethyl Glycol-Propyl Glycol (SYSTANE OP) Place 1 drop into both eyes daily as needed (dry eyes). (Patient not taking: Reported on 02/28/2024)   Not Taking   Psyllium Husk POWD Take 5.8 g by mouth daily. 1 rounded tsp in water daily (Patient not taking: Reported on 02/28/2024)   Not Taking   senna (SENOKOT) 8.6 MG TABS tablet Take 1 tablet (8.6 mg total) by mouth daily as needed for mild constipation. (Patient not taking: Reported on 02/28/2024) 30 tablet 0 Not Taking   traMADol  (ULTRAM ) 50 MG tablet Take 1-2 tablets (50-100 mg total) by mouth every 4 (four) hours as needed for moderate pain. (Patient not taking: Reported on 02/28/2024) 30 tablet 0 Not Taking   vitamin B-12 (CYANOCOBALAMIN ) 1000 MCG tablet Take 2,000 mcg by mouth daily. (Patient not taking: Reported on 10/22/2023)   Not Taking   Social History   Socioeconomic History   Marital status: Widowed    Spouse name: Not on file   Number of children: Not on file   Years of education: Not on file   Highest education level: Not on file  Occupational History   Not on file  Tobacco Use   Smoking status: Never   Smokeless tobacco: Never  Vaping Use   Vaping status: Never Used  Substance and Sexual Activity   Alcohol  use: No    Alcohol /week: 0.0 standard drinks of alcohol    Drug use: No   Sexual activity: Never    Partners: Male  Other Topics Concern   Not on file  Social History Narrative   Widowed   Retired   Children 3   Pets: none   Caffeine- Coffee, Tea, rare soda   Social Drivers of Corporate investment banker Strain: Low Risk  (12/07/2023)   Received from [facility]   Overall Financial Resource Strain (CARDIA)    Difficulty of Paying Living Expenses: Not hard at all  Food Insecurity: No Food Insecurity (02/29/2024)   Hunger Vital Sign    Worried About Running Out of Food in the Last Year: Never true    Ran Out  of Food in the Last Year: Never true  Transportation Needs: No Transportation Needs (02/29/2024)   PRAPARE - Administrator, Civil Service (Medical): No    Lack of Transportation (Non-Medical): No  Physical Activity: Inactive (03/11/2023)   Exercise Vital Sign    Days of Exercise per Week: 0 days    Minutes of Exercise per Session: 0 min  Stress: No Stress Concern Present (03/11/2023)   [employer] of Occupational Health - Occupational Stress Questionnaire    Feeling of Stress : Not at all  Social Connections: Moderately Integrated (02/29/2024)   Social Connection and Isolation Panel [NHANES]    Frequency of Communication with Friends and Family: More than three times a week    Frequency of Social Gatherings with Friends  and Family: Twice a week    Attends Religious Services: More than 4 times per year    Active Member of Clubs or Organizations: Yes    Attends Banker Meetings: More than 4 times per year    Marital Status: Widowed  Intimate Partner Violence: Not At Risk (02/29/2024)   Humiliation, Afraid, Rape, and Kick questionnaire    Fear of Current or Ex-Partner: No    Emotionally Abused: No    Physically Abused: No    Sexually Abused: No    Family History  Problem Relation Age of Onset   Arthritis Mother    Hypertension Mother    Arthritis Father    Hypertension Father    Arthritis Maternal Grandmother    Cancer Brother        Colon   Cancer Maternal Aunt        Breast Cancer   Breast cancer Maternal Aunt    Breast cancer Cousin    Breast cancer Cousin    Hyperlipidemia Son    Hyperlipidemia Son      Vitals:   03/01/24 0340 03/01/24 0559 03/01/24 0830 03/01/24 0908  BP:   116/73 118/86  Pulse:      Resp:  20    Temp:   98.3 F (36.8 C)   TempSrc:   Oral   SpO2:   96%   Weight: 67.1 kg     Height:        PHYSICAL EXAM General: Well-appearing elderly female, well nourished, in no acute distress. HEENT: Normocephalic and  atraumatic. Neck: No JVD.   Lungs: Normal respiratory effort on room air. Clear bilaterally to auscultation. No wheezes, crackles, rhonchi.  Heart: irregularly irregular, elevated HR. Normal S1 and S2 without gallops or murmurs.  Abdomen: Non-distended appearing.  Msk: Normal strength and tone for age. Extremities: Warm and well perfused. No clubbing, cyanosis. 1+ pitting edema bilaterally.  Neuro: Alert and oriented X 3. Psych: Answers questions appropriately.   Labs: Basic Metabolic Panel: Recent Labs    02/28/24 1458 02/29/24 0445 03/01/24 0303  NA 135 138 136  K 4.2 3.8 3.8  CL 103 105 102  CO2 22 24 25   GLUCOSE 103* 96 88  BUN 20 20 26*  CREATININE 0.87 1.01* 1.13*  CALCIUM 8.8* 8.7* 8.2*  MG 2.1 2.1  --    Liver Function Tests: No results for input(s): "AST", "ALT", "ALKPHOS", "BILITOT", "PROT", "ALBUMIN" in the last 72 hours. No results for input(s): "LIPASE", "AMYLASE" in the last 72 hours. CBC: Recent Labs    02/29/24 0445 03/01/24 0303  WBC 6.3 6.6  HGB 11.0* 11.1*  HCT 34.5* 34.3*  MCV 88.2 87.1  PLT 241 219   Cardiac Enzymes: Recent Labs    02/28/24 1458 02/28/24 2129  TROPONINIHS 8 8   BNP: Recent Labs    02/28/24 1458  BNP 659.4*   D-Dimer: No results for input(s): "DDIMER" in the last 72 hours. Hemoglobin A1C: No results for input(s): "HGBA1C" in the last 72 hours. Fasting Lipid Panel: No results for input(s): "CHOL", "HDL", "LDLCALC", "TRIG", "CHOLHDL", "LDLDIRECT" in the last 72 hours. Thyroid  Function Tests: Recent Labs    02/29/24 0445  TSH 3.701   Anemia Panel: No results for input(s): "VITAMINB12", "FOLATE", "FERRITIN", "TIBC", "IRON", "RETICCTPCT" in the last 72 hours.   Radiology: DG Chest 2 View Result Date: 02/28/2024 CLINICAL DATA:  Shortness of breath for 3 weeks EXAM: CHEST - 2 VIEW COMPARISON:  None Available. FINDINGS: Normal cardiopericardial silhouette.  Calcified aorta. Small pleural effusions. No pneumothorax. No  consolidation. There is some interstitial changes seen lungs which could be chronic. Please correlate with any prior. Degenerative changes of the spine. IMPRESSION: Underinflation.  Small pleural effusions. Mild interstitial prominence. These very well could be chronic. Please correlate with any prior. Electronically Signed   By: Adrianna Horde M.D.   On: 02/28/2024 15:25    ECHO ordered  TELEMETRY reviewed by me 03/01/2024: atrial fibrillation, rate 120-130s  EKG reviewed by me: Atrial fibrillation, rate 140 bpm  Data reviewed by me 03/01/2024: last 24h vitals tele labs imaging I/O hospitalist progress note.  Principal Problem:   New onset of congestive heart failure (HCC) Active Problems:   Hypertension   Prediabetes   Total knee replacement status   Atrial fibrillation with RVR (HCC)   New onset atrial fibrillation (HCC)   Chest pressure    ASSESSMENT AND PLAN:  Jane Willis is a 83 y.o. female  with a past medical history of hypertension with no known history of heart failure or atrial fibrillation who presented to the ED on 02/28/2024 for shortness of breath, orthopnea, lower extremity swelling and cough. Cardiology was consulted for further evaluation.   # New onset AF RVR # Acute heart failure  Patient reports to ED with 3 weeks of cough, worsening SOB, orthopnea, and lower extremity edema without chest pain. Patient has no known history of heart failure or AF. EKG in ED with atrial fibrillation rate 140 bpm. Trops negative x2. BNP elevated at 659. HR elevated and BP stable this afternoon. Per tele remains in AF RVR this AM. -Echo ordered -CHADS2VASC of 4. Anticoagulation is recommended for stroke risk reduction.  -Continue Eliquis 5 mg twice daily for stroke risk reduction. -Increased metoprolol tartrate 25 mg to four times daily for improved HR control. Will hold parameters. -Continue IV lasix 40 mg twice daily. Closely monitor renal function and UOP.  -Goal K >4, Mag >2   -Further GDMT pending echo results.  -Discussed the risks and benefits of proceeding with TEE/DCCV  with the patient. She is agreeable to proceed.  NPO until TEE/DCCV this afternoon (03/01/24 at 12PM) with Dr. Annelle Kiel.  Written consent will be obtained.  This patient's plan of care was discussed and created with Dr. Custovic and she is in agreement.  Signed: Creighton Doffing, PA-C  03/01/2024, 10:50 AM [facility] Cardiology

## 2024-03-01 NOTE — Anesthesia Preprocedure Evaluation (Signed)
 Anesthesia Evaluation  Patient identified by MRN, date of birth, ID band Patient awake    Reviewed: Allergy & Precautions, NPO status , Patient's Chart, lab work & pertinent test results  History of Anesthesia Complications (+) PONV and history of anesthetic complications  Airway Mallampati: III  TM Distance: <3 FB Neck ROM: full    Dental  (+) Chipped   Pulmonary neg shortness of breath   Pulmonary exam normal        Cardiovascular hypertension, (-) angina +CHF  + dysrhythmias Atrial Fibrillation      Neuro/Psych  Headaches  negative psych ROS   GI/Hepatic Neg liver ROS,GERD  Controlled,,  Endo/Other  negative endocrine ROS    Renal/GU negative Renal ROS  negative genitourinary   Musculoskeletal   Abdominal   Peds  Hematology negative hematology ROS (+)   Anesthesia Other Findings Past Medical History: No date: Allergy     Comment:  Seasonal No date: Arthritis No date: Chicken pox No date: Hyperlipidemia No date: Hypertension No date: PONV (postoperative nausea and vomiting) No date: Urinary incontinence  Past Surgical History: 1986: ABDOMINAL HYSTERECTOMY 12/10/2021: ANTERIOR LATERAL LUMBAR FUSION WITH PERCUTANEOUS SCREW 1  LEVEL; N/A     Comment:  Procedure: L4-5 LATERAL INTERBODY FUSION WITH POSTERIOR               PERCUTANEOUS FIXATION;  Surgeon: Jodeen Munch, MD;               Location: [facility] ORS;  Service: Neurosurgery;  Laterality:               N/A; 12/10/2021: APPLICATION OF INTRAOPERATIVE CT SCAN; N/A     Comment:  Procedure: APPLICATION OF INTRAOPERATIVE CT SCAN;                Surgeon: Jodeen Munch, MD;  Location: [facility] ORS;                Service: Neurosurgery;  Laterality: N/A; 1970's: BREAST EXCISIONAL BIOPSY; Left     Comment:  benign 1968: BREAST SURGERY 2014/2015: CATARACT EXTRACTION     Comment:  Both eyes No date: EYE SURGERY 01/2009: FRACTURE SURGERY     Comment:   Fractured Left ankle, plate on outside of ankle 2 rods               through/across ankle from inside of ankle 06/04/2023: KNEE ARTHROPLASTY; Left     Comment:  Procedure: COMPUTER ASSISTED TOTAL KNEE ARTHROPLASTY;                Surgeon: Arlyne Lame, MD;  Location: [facility] ORS;                Service: Orthopedics;  Laterality: Left; No date: OOPHORECTOMY 1960: TONSILLECTOMY AND ADENOIDECTOMY  BMI    Body Mass Index: 26.20 kg/m      Reproductive/Obstetrics negative OB ROS                             Anesthesia Physical Anesthesia Plan  ASA: 4  Anesthesia Plan: General   Post-op Pain Management:    Induction: Intravenous  PONV Risk Score and Plan: Propofol  infusion and TIVA  Airway Management Planned: Natural Airway and Nasal Cannula  Additional Equipment:   Intra-op Plan:   Post-operative Plan:   Informed Consent: I have reviewed the patients History and Physical, chart, labs and discussed the procedure including the risks, benefits and alternatives for the proposed anesthesia  with the patient or authorized representative who has indicated his/her understanding and acceptance.     Dental Advisory Given  Plan Discussed with: Anesthesiologist, CRNA and Surgeon  Anesthesia Plan Comments: (Patient consented for risks of anesthesia including but not limited to:  - adverse reactions to medications - risk of airway placement if required - damage to eyes, teeth, lips or other oral mucosa - nerve damage due to positioning  - sore throat or hoarseness - Damage to heart, brain, nerves, lungs, other parts of body or loss of life  Patient voiced understanding and assent.)       Anesthesia Quick Evaluation

## 2024-03-01 NOTE — Progress Notes (Signed)
 PROGRESS NOTE    Jane Willis  ZOX:[id number] DOB: Mar 19, 1941 DOA: 02/28/2024 PCP: Jacklin Mascot, MD   Assessment & Plan:   Principal Problem:   New onset of congestive heart failure (HCC) Active Problems:   Hypertension   Prediabetes   Total knee replacement status   Atrial fibrillation with RVR (HCC)   New onset atrial fibrillation (HCC)   Chest pressure  Assessment and Plan: Acute systolic CHF: new onset. Echo shows EF 45-50%, no regional wall motion abnormalities, diastolic function is indeterminate, severe MR, mod to severe TR. Monitor I/Os. Cardio following and recs apprec     A. fib: new onset. Continue on metoprolol, eliquis as per cardio. No cardioversion on 03/01/24 b/c TEE showed left atrial appendage thrombus as per cardio. Cardio following and recs apprec   HTN: continue on metoprolol. Holding home amlodipine , valsartan    AKI: Cr is labile. Avoid nephrotoxic meds    DVT prophylaxis: eliquis Code Status: full  Family Communication:  Disposition Plan: likely d/c back home   Level of care: Telemetry Cardiac  Status is: Inpatient Remains inpatient appropriate because: severity of illness    Consultants:  Cardio   Procedures:  Antimicrobials:   Subjective: Pt c/o fatigue   Objective: Vitals:   03/01/24 0003 03/01/24 0339 03/01/24 0340 03/01/24 0559  BP: 119/73 (!) 127/98    Pulse: (!) 110 (!) 111    Resp: 20 18  20   Temp: 97.8 F (36.6 C) 98.4 F (36.9 C)    TempSrc: Oral Oral    SpO2: 96% 96%    Weight:   67.1 kg   Height:        Intake/Output Summary (Last 24 hours) at 03/01/2024 0807 Last data filed at 02/29/2024 2100 Gross per 24 hour  Intake 250 ml  Output --  Net 250 ml   Filed Weights   02/28/24 1451 03/01/24 0340  Weight: 68 kg 67.1 kg    Examination:  General exam: Appears calm and comfortable  Respiratory system: Clear to auscultation. Respiratory effort normal. Cardiovascular system: irregularly irregular. No   rubs, gallops or clicks.  Gastrointestinal system: Abdomen is nondistended, soft and nontender.  Normal bowel sounds heard. Central nervous system: Alert and oriented. Moves all extremities Psychiatry: Judgement and insight appears at baseline. Mood & affect appropriate.     Data Reviewed: I have personally reviewed following labs and imaging studies  CBC: Recent Labs  Lab 02/28/24 1458 02/29/24 0445 03/01/24 0303  WBC 5.9 6.3 6.6  HGB 12.0 11.0* 11.1*  HCT 37.6 34.5* 34.3*  MCV 89.7 88.2 87.1  PLT 268 241 219   Basic Metabolic Panel: Recent Labs  Lab 02/28/24 1458 02/29/24 0445 03/01/24 0303  NA 135 138 136  K 4.2 3.8 3.8  CL 103 105 102  CO2 22 24 25   GLUCOSE 103* 96 88  BUN 20 20 26*  CREATININE 0.87 1.01* 1.13*  CALCIUM 8.8* 8.7* 8.2*  MG 2.1 2.1  --    GFR: Estimated Creatinine Clearance: 34.1 mL/min (A) (by C-G formula based on SCr of 1.13 mg/dL (H)). Liver Function Tests: No results for input(s): "AST", "ALT", "ALKPHOS", "BILITOT", "PROT", "ALBUMIN" in the last 168 hours. No results for input(s): "LIPASE", "AMYLASE" in the last 168 hours. No results for input(s): "AMMONIA" in the last 168 hours. Coagulation Profile: Recent Labs  Lab 02/28/24 1945  INR 1.1   Cardiac Enzymes: No results for input(s): "CKTOTAL", "CKMB", "CKMBINDEX", "TROPONINI" in the last 168 hours. BNP (last 3 results) No  results for input(s): "PROBNP" in the last 8760 hours. HbA1C: No results for input(s): "HGBA1C" in the last 72 hours. CBG: No results for input(s): "GLUCAP" in the last 168 hours. Lipid Profile: No results for input(s): "CHOL", "HDL", "LDLCALC", "TRIG", "CHOLHDL", "LDLDIRECT" in the last 72 hours. Thyroid  Function Tests: Recent Labs    02/29/24 0445  TSH 3.701   Anemia Panel: No results for input(s): "VITAMINB12", "FOLATE", "FERRITIN", "TIBC", "IRON", "RETICCTPCT" in the last 72 hours. Sepsis Labs: No results for input(s): "PROCALCITON", "LATICACIDVEN" in the  last 168 hours.  No results found for this or any previous visit (from the past 240 hours).       Radiology Studies: DG Chest 2 View Result Date: 02/28/2024 CLINICAL DATA:  Shortness of breath for 3 weeks EXAM: CHEST - 2 VIEW COMPARISON:  None Available. FINDINGS: Normal cardiopericardial silhouette. Calcified aorta. Small pleural effusions. No pneumothorax. No consolidation. There is some interstitial changes seen lungs which could be chronic. Please correlate with any prior. Degenerative changes of the spine. IMPRESSION: Underinflation.  Small pleural effusions. Mild interstitial prominence. These very well could be chronic. Please correlate with any prior. Electronically Signed   By: Adrianna Horde M.D.   On: 02/28/2024 15:25        Scheduled Meds:  apixaban  5 mg Oral BID   aspirin  EC  81 mg Oral Daily   dextromethorphan-guaiFENesin  1 tablet Oral BID   metoprolol tartrate  25 mg Oral BID   Continuous Infusions:   LOS: 2 days      Alphonsus Jeans, MD [facility] Pager [phone]  If 7PM-7AM, please contact night-coverage [email or web address] 03/01/2024, 8:07 AM

## 2024-03-01 NOTE — [place] FL2 (Signed)
 [place]  MEDICAID [id number] LEVEL OF CARE FORM     IDENTIFICATION  Patient Name: Jane Willis Birthdate: [birth year] Sex: female Admission Date (Current Location): 02/28/2024  [facility] and [place] Number:  Chiropodist and Address:  [facility], [address] [place], [place] [address]      Provider Number: [id number]  Attending Physician Name and Address:  Alphonsus Jeans, MD  Relative Name and Phone Number:  Dibari,Philip (Son)  [phone] [facility])    Current Level of Care: Hospital Recommended Level of Care: Skilled Nursing Facility Prior Approval Number:    Date Approved/Denied:   PASRR Number: [id number]  Discharge Plan: SNF    Current Diagnoses: Patient Active Problem List   Diagnosis Date Noted   New onset of congestive heart failure (HCC) 02/28/2024   Atrial fibrillation with RVR (HCC) 02/28/2024   New onset atrial fibrillation (HCC) 02/28/2024   Chest pressure 02/28/2024   Left ankle pain 10/22/2023   Total knee replacement status 06/04/2023   Rib pain 04/21/2023   Osteoarthritis 01/18/2023   Headache 12/04/2022   GERD (gastroesophageal reflux disease) 06/03/2022   S/P lumbar fusion 12/10/2021   Chronic low back pain 11/28/2021   History of COVID-19 03/01/2020   Prediabetes 07/06/2018   Age-related osteoporosis without current pathological fracture 12/29/2017   B12 deficiency 12/29/2017   Subcutaneous nodule 06/29/2017   Allergic rhinitis 03/19/2017   Family history of colon cancer 02/11/2017   Hypertension 12/31/2016   Hyperlipidemia 07/03/2016   History of low impact fracture of ankle 07/03/2016   Bilateral finger numbness 04/02/2016    Orientation RESPIRATION BLADDER Height & Weight     Self, Time, Situation, Place  Normal Continent Weight: 67.1 kg Height:  5\' 3"  (160 cm)  BEHAVIORAL SYMPTOMS/MOOD NEUROLOGICAL BOWEL NUTRITION STATUS  Other (Comment) (n/a)  (n/a) Continent Diet (Heart healthy)   AMBULATORY STATUS COMMUNICATION OF NEEDS Skin   Limited Assist Verbally Normal (dry)                       Personal Care Assistance Level of Assistance  Bathing, Dressing Bathing Assistance: Limited assistance   Dressing Assistance: Limited assistance     Functional Limitations Info  Sight, Hearing Sight Info: Adequate Hearing Info: Adequate      SPECIAL CARE FACTORS FREQUENCY  PT (By licensed PT), OT (By licensed OT)     PT Frequency: Min 2x weekly OT Frequency: Min 2x weekly            Contractures Contractures Info: Not present    Additional Factors Info  Code Status, Allergies Code Status Info: FULL Allergies Info: Gabapentin, Ibuprofen           Current Medications (03/01/2024):  This is the current hospital active medication list Current Facility-Administered Medications  Medication Dose Route Frequency Provider Last Rate Last Admin   acetaminophen  (TYLENOL ) tablet 650 mg  650 mg Oral Q6H PRN Cox, Amy N, DO       Or   acetaminophen  (TYLENOL ) suppository 650 mg  650 mg Rectal Q6H PRN Cox, Amy N, DO       apixaban (ELIQUIS) tablet 5 mg  5 mg Oral BID Decoste, Gabriella, PA-C   5 mg at 03/01/24 0919   aspirin  EC tablet 81 mg  81 mg Oral Daily Cox, Amy N, DO   81 mg at 03/01/24 0919   chlorpheniramine-HYDROcodone (TUSSIONEX) 10-8 MG/5ML suspension 5 mL  5 mL Oral QHS PRN Joette Mustard, MD  dextromethorphan-guaiFENesin (MUCINEX DM) 30-600 MG per 12 hr tablet 1 tablet  1 tablet Oral BID Joette Mustard, MD   1 tablet at 03/01/24 0920   labetalol  (NORMODYNE ) injection 5 mg  5 mg Intravenous Q3H PRN Cox, Amy N, DO       metoprolol tartrate (LOPRESSOR) injection 5 mg  5 mg Intravenous Q4H PRN Decoste, Gabriella, PA-C       metoprolol tartrate (LOPRESSOR) tablet 25 mg  25 mg Oral QID Decoste, Gabriella, PA-C   25 mg at 03/01/24 1408   ondansetron  (ZOFRAN ) tablet 4 mg  4 mg Oral Q6H PRN Cox, Amy N, DO       Or   ondansetron  (ZOFRAN ) injection 4 mg  4  mg Intravenous Q6H PRN Cox, Amy N, DO       senna-docusate (Senokot-S) tablet 1 tablet  1 tablet Oral QHS PRN Cox, Amy N, DO         Discharge Medications: Please see discharge summary for a list of discharge medications.  Relevant Imaging Results:  Relevant Lab Results:   Additional Information SSN# [id number]  Baird Bombard, [place]

## 2024-03-01 NOTE — Progress Notes (Signed)
*  PRELIMINARY RESULTS* Echocardiogram 2D Echocardiogram has been performed.  Jane Willis 03/01/2024, 2:49 PM

## 2024-03-01 NOTE — Transfer of Care (Signed)
 Immediate Anesthesia Transfer of Care Note  Patient: Jane Willis  Procedure(s) Performed: CARDIOVERSION ECHOCARDIOGRAM, TRANSESOPHAGEAL  Patient Location: Cath Lab  Anesthesia Type:General  Level of Consciousness: drowsy  Airway & Oxygen Therapy: Patient Spontanous Breathing and Patient connected to nasal cannula oxygen  Post-op Assessment: Report given to RN and Post -op Vital signs reviewed and stable  Post vital signs: Reviewed and stable  Last Vitals:  Vitals Value Taken Time  BP 104/89 03/01/24 1315  Temp    Pulse 112 03/01/24 1315  Resp 20 03/01/24 1315  SpO2 94 % 03/01/24 1315    Last Pain:  Vitals:   03/01/24 1315  TempSrc:   PainSc: 0-No pain         Complications: There were no known notable events for this encounter.

## 2024-03-01 NOTE — Progress Notes (Signed)
 Plan of care is reviewed. Pt is alert and fully oriented x 4, afebrile, Atrial fibrillation on the monitor. HR 110 at rest, 130-140 with ambulation to the bathroom. BP remains stable, no acute distress noted overnight, denies chest pain or heart palpitations. No SOB with independently ambulation in her room. She is able to rest well with no major complaints overnight. NPO after midnight per MD ordered.  We will continue to monitor.   Brain Cahill, RN

## 2024-03-01 NOTE — Anesthesia Postprocedure Evaluation (Signed)
 Anesthesia Post Note  Patient: AKARI VINCENTE  Procedure(s) Performed: CARDIOVERSION ECHOCARDIOGRAM, TRANSESOPHAGEAL  Patient location during evaluation: Specials Recovery Anesthesia Type: General Level of consciousness: awake and alert Pain management: pain level controlled Vital Signs Assessment: post-procedure vital signs reviewed and stable Respiratory status: spontaneous breathing, nonlabored ventilation, respiratory function stable and patient connected to nasal cannula oxygen Cardiovascular status: blood pressure returned to baseline and stable Postop Assessment: no apparent nausea or vomiting Anesthetic complications: no   There were no known notable events for this encounter.   Last Vitals:  Vitals:   03/01/24 1315 03/01/24 1332  BP: 104/89 126/84  Pulse: (!) 112 (!) 118  Resp: 20   Temp:  36.7 C  SpO2: 94% 96%    Last Pain:  Vitals:   03/01/24 1332  TempSrc: Oral  PainSc:                  Portia Brittle Arta Stump

## 2024-03-02 ENCOUNTER — Encounter: Payer: Self-pay | Admitting: Internal Medicine

## 2024-03-02 LAB — BASIC METABOLIC PANEL WITH GFR
Anion gap: 8 (ref 5–15)
BUN: 22 mg/dL (ref 8–23)
CO2: 25 mmol/L (ref 22–32)
Calcium: 8.4 mg/dL — ABNORMAL LOW (ref 8.9–10.3)
Chloride: 102 mmol/L (ref 98–111)
Creatinine, Ser: 1.06 mg/dL — ABNORMAL HIGH (ref 0.44–1.00)
GFR, Estimated: 52 mL/min — ABNORMAL LOW (ref >60)
Glucose, Bld: 128 mg/dL — ABNORMAL HIGH (ref 70–99)
Potassium: 3.9 mmol/L (ref 3.5–5.1)
Sodium: 135 mmol/L (ref 135–145)

## 2024-03-02 LAB — CBC
HCT: 32.8 % — ABNORMAL LOW (ref 36.0–46.0)
Hemoglobin: 10.7 g/dL — ABNORMAL LOW (ref 12.0–15.0)
MCH: 28.2 pg (ref 26.0–34.0)
MCHC: 32.6 g/dL (ref 30.0–36.0)
MCV: 86.5 fL (ref 80.0–100.0)
Platelets: 238 10*3/uL (ref 150–400)
RBC: 3.79 MIL/uL — ABNORMAL LOW (ref 3.87–5.11)
RDW: 14.1 % (ref 11.5–15.5)
WBC: 7.1 10*3/uL (ref 4.0–10.5)
nRBC: 0 % (ref 0.0–0.2)

## 2024-03-02 LAB — ECHO TEE

## 2024-03-02 MED ORDER — FUROSEMIDE 10 MG/ML IJ SOLN
40.0000 mg | Freq: Once | INTRAMUSCULAR | Status: AC
  Administered 2024-03-02: 40 mg via INTRAVENOUS
  Filled 2024-03-02: qty 4

## 2024-03-02 MED ORDER — FUROSEMIDE 10 MG/ML IJ SOLN
40.0000 mg | Freq: Two times a day (BID) | INTRAMUSCULAR | Status: DC
Start: 2024-03-02 — End: 2024-03-02

## 2024-03-02 MED ORDER — DIGOXIN 0.25 MG/ML IJ SOLN
0.2500 mg | Freq: Once | INTRAMUSCULAR | Status: AC
  Administered 2024-03-02: 0.25 mg via INTRAVENOUS
  Filled 2024-03-02: qty 2

## 2024-03-02 MED ORDER — DIGOXIN 0.25 MG/ML IJ SOLN
0.5000 mg | Freq: Once | INTRAMUSCULAR | Status: AC
  Administered 2024-03-02: 0.5 mg via INTRAVENOUS
  Filled 2024-03-02: qty 2

## 2024-03-02 MED ORDER — FUROSEMIDE 10 MG/ML IJ SOLN: 40.0000 mg | Freq: Two times a day (BID) | INTRAMUSCULAR | Status: DC

## 2024-03-02 MED ORDER — DIGOXIN 0.25 MG/ML IJ SOLN
0.1250 mg | Freq: Every day | INTRAMUSCULAR | Status: DC
  Administered 2024-03-03: 0.125 mg via INTRAVENOUS
  Filled 2024-03-02: qty 2

## 2024-03-02 NOTE — Care Management Important Message (Signed)
 Important Message  Patient Details  Name: Jane Willis MRN: [id number] Date of Birth: Aug 24, 1941   Important Message Given:  Yes - Medicare IM     Anise Kerns 03/02/2024, 1:00 PM

## 2024-03-02 NOTE — Plan of Care (Signed)
  Problem: Education: Goal: Knowledge of General Education information will improve Description: Including pain rating scale, medication(s)/side effects and non-pharmacologic comfort measures Outcome: Progressing   Problem: Clinical Measurements: Goal: Ability to maintain clinical measurements within normal limits will improve Outcome: Progressing   Problem: Clinical Measurements: Goal: Will remain free from infection Outcome: Progressing   Problem: Clinical Measurements: Goal: Diagnostic test results will improve Outcome: Progressing   Problem: Clinical Measurements: Goal: Respiratory complications will improve Outcome: Progressing   Problem: Activity: Goal: Risk for activity intolerance will decrease Outcome: Progressing   Problem: Skin Integrity: Goal: Risk for impaired skin integrity will decrease Outcome: Progressing   Problem: Cardiac: Goal: Ability to achieve and maintain adequate cardiopulmonary perfusion will improve Outcome: Progressing  Plan of care ongoing, see MAR see flowsheet

## 2024-03-02 NOTE — Evaluation (Signed)
 Occupational Therapy Evaluation Patient Details Name: Jane Willis MRN: [id number] DOB: 05-Aug-1941 Today's Date: 03/02/2024   History of Present Illness   Pt is an 83 year old female presented to the ED on 02/28/2024 for shortness of breath, orthopnea, lower extremity swelling and cough, admitted with new onset AF RVR, acute heart failure     Pmh significant for ypertension with no known history of heart failure or atrial fibrillation     Clinical Impressions Chart reviewed, pt in chair, alert and oriented x4, agreeable to OT evaluation. PTA pt is MOD I-I in ADL/IADL, amb with no AD (but has DME from recent knee replacement). Pt presents with deficits in endurance and activity tolerance. Pt is performing ADL at a supervision-MOD I level and is amb to bathroom without staff. Provided edcuation re; energy conservation techniques and optimizing ADL performance with DME/ techniques via demo and hand out. Good carry over noted. Pt is left in chair, all needs met. OT will follow acutely to facilitate optimal ADL performance, no OT needs anticipated after discharge.      If plan is discharge home, recommend the following:   Assistance with cooking/housework     Functional Status Assessment   Patient has had a recent decline in their functional status and demonstrates the ability to make significant improvements in function in a reasonable and predictable amount of time.     Equipment Recommendations   Tub/shower seat     Recommendations for Other Services         Precautions/Restrictions   Precautions Precautions: None Recall of Precautions/Restrictions: Intact     Mobility Bed Mobility               General bed mobility comments: NT in recliner pre/post session    Transfers Overall transfer level: Modified independent                        Balance Overall balance assessment: Needs assistance Sitting-balance support: Feet supported Sitting  balance-Leahy Scale: Normal     Standing balance support: No upper extremity supported Standing balance-Leahy Scale: Good                             ADL either performed or assessed with clinical judgement   ADL Overall ADL's : Needs assistance/impaired Eating/Feeding: Set up;Sitting   Grooming: Set up               Lower Body Dressing: Set up   Toilet Transfer: Modified Independent;Ambulation Toilet Transfer Details (indicate cue type and reason): simulated with no AD Toileting- Clothing Manipulation and Hygiene: Supervision/safety       Functional mobility during ADLs: Supervision/safety (approx 10' in room with no AD, pt has been amb to bathroom)       Vision Patient Visual Report: No change from baseline       Perception         Praxis         Pertinent Vitals/Pain Pain Assessment Pain Assessment: No/denies pain     Extremity/Trunk Assessment Upper Extremity Assessment Upper Extremity Assessment: Overall WFL for tasks assessed   Lower Extremity Assessment Lower Extremity Assessment: Overall WFL for tasks assessed       Communication Communication Communication: No apparent difficulties   Cognition Arousal: Alert Behavior During Therapy: WFL for tasks assessed/performed Cognition: No apparent impairments  Following commands: Intact       Cueing  General Comments   Cueing Techniques: Verbal cues  HR up to 140s bpm during session, spo2 >90% on RA   Exercises Other Exercises Other Exercises: edu re: role of OT, role of rehab, discharge recommendations, ADL completion using energy conservation strategies   Shoulder Instructions      Home Living Family/patient expects to be discharged to:: Private residence   Available Help at Discharge: Family (3 sons live nearby, check on her daily and can assist with whatever is needed per pt) Type of Home: House Home Access: Stairs to  enter [employer] of Steps: 5 Entrance Stairs-Rails: Right;Left;Can reach both Home Layout: One level;Other (Comment) (has a basement that she sews in)     Bathroom Shower/Tub: Tub/shower unit   Bathroom Toilet: Handicapped height     Home Equipment: Adaptive equipment;Rolling Environmental consultant (2 wheels);Cane - single point;BSC/3in1 Adaptive Equipment: Reacher Additional Comments: Pt has a bidet and arms around the toilet      Prior Functioning/Environment Prior Level of Function : Independent/Modified Independent;Driving             Mobility Comments: amb with no AD ADLs Comments: MOD I- I with ADL/IADL    OT Problem List: Decreased activity tolerance;Decreased knowledge of use of DME or AE   OT Treatment/Interventions: Self-care/ADL training;DME and/or AE instruction;Therapeutic activities;Therapeutic exercise;Patient/family education;Energy conservation      OT Goals(Current goals can be found in the care plan section)   Acute Rehab OT Goals Patient Stated Goal: stay strong OT Goal Formulation: With patient Time For Goal Achievement: 03/16/24 Potential to Achieve Goals: Good ADL Goals Pt Will Perform Grooming: with modified independence Pt Will Perform Lower Body Dressing: with modified independence Pt Will Transfer to Toilet: with modified independence Pt Will Perform Toileting - Clothing Manipulation and hygiene: with modified independence   OT Frequency:  Min 1X/week    Co-evaluation              AM-PAC OT "6 Clicks" Daily Activity     Outcome Measure Help from another person eating meals?: None Help from another person taking care of personal grooming?: None Help from another person toileting, which includes using toliet, bedpan, or urinal?: None Help from another person bathing (including washing, rinsing, drying)?: A Little Help from another person to put on and taking off regular upper body clothing?: None Help from another person to put on  and taking off regular lower body clothing?: None 6 Click Score: 23   End of Session Nurse Communication: Mobility status  Activity Tolerance: Patient tolerated treatment well Patient left: in chair;with call bell/phone within reach  OT Visit Diagnosis: Other abnormalities of gait and mobility (R26.89)                Time: 1002-1020 OT Time Calculation (min): 18 min Charges:  OT General Charges $OT Visit: 1 Visit OT Evaluation $OT Eval Low Complexity: 1 Low  Gerre Kraft, OTD OTR/L  03/02/24, 12:54 PM

## 2024-03-02 NOTE — Evaluation (Signed)
 Physical Therapy Evaluation Patient Details Name: Jane Willis MRN: [id number] DOB: 1941-10-07 Today's Date: 03/02/2024  History of Present Illness  Pt is an 83 year old female presented to the ED on 02/28/2024 for shortness of breath, orthopnea, lower extremity swelling and cough, admitted with new onset AF RVR, acute heart failure     Pmh significant for hypertension with no known history of heart failure or atrial fibrillation  Clinical Impression  Patient received in recliner. She is agreeable to PT assessment. Patient stands from recliner independently. She dons shoes, ambulated 300 feet without AD, cga. HR up to 130s with mobility. She will continue to benefit from PT follow up to ensure independence and safety.        If plan is discharge home, recommend the following: Assist for transportation;Help with stairs or ramp for entrance   Can travel by private vehicle    yes    Equipment Recommendations None recommended by PT  Recommendations for Other Services       Functional Status Assessment Patient has had a recent decline in their functional status and demonstrates the ability to make significant improvements in function in a reasonable and predictable amount of time.     Precautions / Restrictions Precautions Precautions: None Recall of Precautions/Restrictions: Intact Restrictions Weight Bearing Restrictions Per Provider Order: No      Mobility  Bed Mobility               General bed mobility comments: NT in recliner pre/post session    Transfers Overall transfer level: Modified independent Equipment used: None                    Ambulation/Gait Ambulation/Gait assistance: Supervision, Contact guard assist Gait Distance (Feet): 300 Feet Assistive device: None Gait Pattern/deviations: Step-through pattern Gait velocity: WFL     General Gait Details: patient had recent knee replacement. Mild limp initially with ambulation and donned shoes  for mobility.  Stairs            Wheelchair Mobility     Tilt Bed    Modified Rankin (Stroke Patients Only)       Balance Overall balance assessment: Needs assistance Sitting-balance support: Feet supported Sitting balance-Leahy Scale: Normal     Standing balance support: During functional activity, No upper extremity supported Standing balance-Leahy Scale: Good Standing balance comment: supervision                             Pertinent Vitals/Pain Pain Assessment Pain Assessment: No/denies pain    Home Living Family/patient expects to be discharged to:: Private residence Living Arrangements: Alone Available Help at Discharge: Family Type of Home: House Home Access: Stairs to enter Entrance Stairs-Rails: Right;Left;Can reach both Entrance Stairs-Number of Steps: 5   Home Layout: One level Home Equipment: Adaptive equipment;Rolling Walker (2 wheels);Cane - single point;BSC/3in1 Additional Comments: Pt has a bidet and arms around the toilet    Prior Function Prior Level of Function : Independent/Modified Independent;Driving             Mobility Comments: amb with no AD ADLs Comments: MOD I- I with ADL/IADL     Extremity/Trunk Assessment   Upper Extremity Assessment Upper Extremity Assessment: Overall WFL for tasks assessed    Lower Extremity Assessment Lower Extremity Assessment: Generalized weakness    Cervical / Trunk Assessment Cervical / Trunk Assessment: Normal  Communication   Communication Communication: No apparent difficulties  Cognition Arousal: Alert Behavior During Therapy: WFL for tasks assessed/performed   PT - Cognitive impairments: No apparent impairments                         Following commands: Intact       Cueing Cueing Techniques: Verbal cues     General Comments General comments (skin integrity, edema, etc.): HR up to 140s bpm during session, spo2 >90% on RA    Exercises      Assessment/Plan    PT Assessment Patient needs continued PT services  PT Problem List Decreased strength;Decreased activity tolerance;Decreased mobility       PT Treatment Interventions Gait training;Stair training;Functional mobility training;Therapeutic activities;Therapeutic exercise;Patient/family education    PT Goals (Current goals can be found in the Care Plan section)  Acute Rehab PT Goals Patient Stated Goal: return home once medications are regulated PT Goal Formulation: With patient Time For Goal Achievement: 03/09/24 Potential to Achieve Goals: Good    Frequency Min 2X/week     Co-evaluation               AM-PAC PT "6 Clicks" Mobility  Outcome Measure Help needed turning from your back to your side while in a flat bed without using bedrails?: None Help needed moving from lying on your back to sitting on the side of a flat bed without using bedrails?: None Help needed moving to and from a bed to a chair (including a wheelchair)?: None Help needed standing up from a chair using your arms (e.g., wheelchair or bedside chair)?: None Help needed to walk in hospital room?: A Little Help needed climbing 3-5 steps with a railing? : A Little 6 Click Score: 22    End of Session   Activity Tolerance: Patient tolerated treatment well Patient left: in chair;with call bell/phone within reach Nurse Communication: Mobility status PT Visit Diagnosis: Other abnormalities of gait and mobility (R26.89);Muscle weakness (generalized) (M62.81)    Time: 8119-1478 PT Time Calculation (min) (ACUTE ONLY): 12 min   Charges:   PT Evaluation $PT Eval Low Complexity: 1 Low   PT General Charges $$ ACUTE PT VISIT: 1 Visit         Elisheva Fallas, PT, GCS 03/02/24,3:08 PM

## 2024-03-02 NOTE — Progress Notes (Signed)
 [facility] CLINIC CARDIOLOGY PROGRESS NOTE       Patient ID: Jane Willis MRN: [id number] DOB/AGE: April 21, 1941 83 y.o.  Admit date: 02/28/2024 Referring Physician Dr. Joette Mustard Primary Physician Bair, Randa Burton, MD Primary Cardiologist None Reason for Consultation Acute heart failure, AF RVR  HPI: Jane Willis is a 83 y.o. female  with a past medical history of hypertension with no known history of heart failure or atrial fibrillation who presented to the ED on 02/28/2024 for shortness of breath, orthopnea, lower extremity swelling and cough. Cardiology was consulted for further evaluation.   Interval History: -Patient seen and examined this AM and pt sitting in bedside chair. Patient states she's feeling well and reports improvement in breathing, LE edema, and orthopnea. Patient states she feels her breathing is near baseline. -Patients BP borderline and HR elevated this AM. Per tele patient remains AF RVR 110s- 120s. -UOP yesterday 600cc with stable Cr. -Electrolytes are stable.  -Patient remains on room air with stable SpO2.  -yesterday, TEE performed without DCCV due to thrombus in LA appendage.    Review of systems complete and found to be negative unless listed above    Past Medical History:  Diagnosis Date   Allergy    Seasonal   Arthritis    Chicken pox    Hyperlipidemia    Hypertension    PONV (postoperative nausea and vomiting)    Urinary incontinence     Past Surgical History:  Procedure Laterality Date   ABDOMINAL HYSTERECTOMY  1986   ANTERIOR LATERAL LUMBAR FUSION WITH PERCUTANEOUS SCREW 1 LEVEL N/A 12/10/2021   Procedure: L4-5 LATERAL INTERBODY FUSION WITH POSTERIOR PERCUTANEOUS FIXATION;  Surgeon: Jodeen Munch, MD;  Location: [facility] ORS;  Service: Neurosurgery;  Laterality: N/A;   APPLICATION OF INTRAOPERATIVE CT SCAN N/A 12/10/2021   Procedure: APPLICATION OF INTRAOPERATIVE CT SCAN;  Surgeon: Jodeen Munch, MD;  Location: [facility] ORS;   Service: Neurosurgery;  Laterality: N/A;   BREAST EXCISIONAL BIOPSY Left 1970's   benign   BREAST SURGERY  1968   CATARACT EXTRACTION  2014/2015   Both eyes   EYE SURGERY     FRACTURE SURGERY  01/2009   Fractured Left ankle, plate on outside of ankle 2 rods through/across ankle from inside of ankle   KNEE ARTHROPLASTY Left 06/04/2023   Procedure: COMPUTER ASSISTED TOTAL KNEE ARTHROPLASTY;  Surgeon: Arlyne Lame, MD;  Location: [facility] ORS;  Service: Orthopedics;  Laterality: Left;   OOPHORECTOMY     TONSILLECTOMY AND ADENOIDECTOMY  1960    Medications Prior to Admission  Medication Sig Dispense Refill Last Dose/Taking   acetaminophen  (TYLENOL ) 500 MG tablet Take 1,000 mg by mouth as needed for moderate pain.   Taking As Needed   amLODipine -valsartan  (EXFORGE ) 10-160 MG tablet Take 1 tablet by mouth daily. 90 tablet 0 02/28/2024   aspirin  EC 81 MG tablet Take 1 tablet (81 mg total) by mouth in the morning and at bedtime. Swallow whole.   02/28/2024   Ascorbic Acid  (VITAMIN C) 1000 MG tablet Take 1,000 mg by mouth daily. (Patient not taking: Reported on 02/28/2024)   Not Taking   celecoxib  (CELEBREX ) 200 MG capsule Take 1 capsule (200 mg total) by mouth 2 (two) times daily. (Patient not taking: Reported on 10/22/2023) 60 capsule 1 Not Taking   Cholecalciferol  (VITAMIN D -3) 1000 UNITS CAPS Take 2,000 capsules by mouth daily. (Patient not taking: Reported on 02/28/2024)   Not Taking   diclofenac Sodium (VOLTAREN) 1 % GEL Apply 2 g topically  3 (three) times daily as needed. (Patient not taking: Reported on 10/22/2023)   Not Taking   ondansetron  (ZOFRAN ) 4 MG tablet Take 1 tablet (4 mg total) by mouth every 8 (eight) hours as needed for nausea or vomiting. (Patient not taking: Reported on 02/28/2024) 30 tablet 0 Not Taking   oxyCODONE  (OXY IR/ROXICODONE ) 5 MG immediate release tablet Take 1 tablet (5 mg total) by mouth every 4 (four) hours as needed for moderate pain (pain score 4-6). (Patient not taking:  Reported on 10/22/2023) 30 tablet 0 Not Taking   Polyethyl Glycol-Propyl Glycol (SYSTANE OP) Place 1 drop into both eyes daily as needed (dry eyes). (Patient not taking: Reported on 02/28/2024)   Not Taking   Psyllium Husk POWD Take 5.8 g by mouth daily. 1 rounded tsp in water daily (Patient not taking: Reported on 02/28/2024)   Not Taking   senna (SENOKOT) 8.6 MG TABS tablet Take 1 tablet (8.6 mg total) by mouth daily as needed for mild constipation. (Patient not taking: Reported on 02/28/2024) 30 tablet 0 Not Taking   traMADol  (ULTRAM ) 50 MG tablet Take 1-2 tablets (50-100 mg total) by mouth every 4 (four) hours as needed for moderate pain. (Patient not taking: Reported on 02/28/2024) 30 tablet 0 Not Taking   vitamin B-12 (CYANOCOBALAMIN ) 1000 MCG tablet Take 2,000 mcg by mouth daily. (Patient not taking: Reported on 10/22/2023)   Not Taking   Social History   Socioeconomic History   Marital status: Widowed    Spouse name: Not on file   Number of children: Not on file   Years of education: Not on file   Highest education level: Not on file  Occupational History   Not on file  Tobacco Use   Smoking status: Never   Smokeless tobacco: Never  Vaping Use   Vaping status: Never Used  Substance and Sexual Activity   Alcohol  use: No    Alcohol /week: 0.0 standard drinks of alcohol    Drug use: No   Sexual activity: Never    Partners: Male  Other Topics Concern   Not on file  Social History Narrative   Widowed   Retired   Children 3   Pets: none   Caffeine- Coffee, Tea, rare soda   Social Drivers of Corporate investment banker Strain: Low Risk  (12/07/2023)   Received from [facility]   Overall Financial Resource Strain (CARDIA)    Difficulty of Paying Living Expenses: Not hard at all  Food Insecurity: No Food Insecurity (02/29/2024)   Hunger Vital Sign    Worried About Running Out of Food in the Last Year: Never true    Ran Out of Food in the Last Year: Never true   Transportation Needs: No Transportation Needs (02/29/2024)   PRAPARE - Administrator, Civil Service (Medical): No    Lack of Transportation (Non-Medical): No  Physical Activity: Inactive (03/11/2023)   Exercise Vital Sign    Days of Exercise per Week: 0 days    Minutes of Exercise per Session: 0 min  Stress: No Stress Concern Present (03/11/2023)   [employer] of Occupational Health - Occupational Stress Questionnaire    Feeling of Stress : Not at all  Social Connections: Moderately Integrated (02/29/2024)   Social Connection and Isolation Panel [NHANES]    Frequency of Communication with Friends and Family: More than three times a week    Frequency of Social Gatherings with Friends and Family: Twice a week  Attends Religious Services: More than 4 times per year    Active Member of Clubs or Organizations: Yes    Attends Banker Meetings: More than 4 times per year    Marital Status: Widowed  Intimate Partner Violence: Not At Risk (02/29/2024)   Humiliation, Afraid, Rape, and Kick questionnaire    Fear of Current or Ex-Partner: No    Emotionally Abused: No    Physically Abused: No    Sexually Abused: No    Family History  Problem Relation Age of Onset   Arthritis Mother    Hypertension Mother    Arthritis Father    Hypertension Father    Arthritis Maternal Grandmother    Cancer Brother        Colon   Cancer Maternal Aunt        Breast Cancer   Breast cancer Maternal Aunt    Breast cancer Cousin    Breast cancer Cousin    Hyperlipidemia Son    Hyperlipidemia Son      Vitals:   03/02/24 0741 03/02/24 0742 03/02/24 0836 03/02/24 1103  BP: (!) 108/95   116/87  Pulse: (!) 136 88  89  Resp: 19   17  Temp: 98.7 F (37.1 C)   98.3 F (36.8 C)  TempSrc:      SpO2: 95% 97%  95%  Weight:   62.5 kg   Height:        PHYSICAL EXAM General: Well-appearing elderly female, well nourished, in no acute distress. HEENT: Normocephalic and  atraumatic. Neck: No JVD.   Lungs: Normal respiratory effort on room air. Clear bilaterally to auscultation. No wheezes, crackles, rhonchi.  Heart: irregularly irregular, elevated HR. Normal S1 and S2 without gallops or murmurs.  Abdomen: Non-distended appearing.  Msk: Normal strength and tone for age. Extremities: Warm and well perfused. No clubbing, cyanosis, edema.  Neuro: Alert and oriented X 3. Psych: Answers questions appropriately.   Labs: Basic Metabolic Panel: Recent Labs    02/28/24 1458 02/29/24 0445 03/01/24 0303 03/02/24 0442  NA 135 138 136 135  K 4.2 3.8 3.8 3.9  CL 103 105 102 102  CO2 22 24 25 25   GLUCOSE 103* 96 88 128*  BUN 20 20 26* 22  CREATININE 0.87 1.01* 1.13* 1.06*  CALCIUM 8.8* 8.7* 8.2* 8.4*  MG 2.1 2.1  --   --    Liver Function Tests: No results for input(s): "AST", "ALT", "ALKPHOS", "BILITOT", "PROT", "ALBUMIN" in the last 72 hours. No results for input(s): "LIPASE", "AMYLASE" in the last 72 hours. CBC: Recent Labs    03/01/24 0303 03/02/24 0442  WBC 6.6 7.1  HGB 11.1* 10.7*  HCT 34.3* 32.8*  MCV 87.1 86.5  PLT 219 238   Cardiac Enzymes: Recent Labs    02/28/24 1458 02/28/24 2129  TROPONINIHS 8 8   BNP: Recent Labs    02/28/24 1458  BNP 659.4*   D-Dimer: No results for input(s): "DDIMER" in the last 72 hours. Hemoglobin A1C: No results for input(s): "HGBA1C" in the last 72 hours. Fasting Lipid Panel: No results for input(s): "CHOL", "HDL", "LDLCALC", "TRIG", "CHOLHDL", "LDLDIRECT" in the last 72 hours. Thyroid  Function Tests: Recent Labs    02/29/24 0445  TSH 3.701   Anemia Panel: No results for input(s): "VITAMINB12", "FOLATE", "FERRITIN", "TIBC", "IRON", "RETICCTPCT" in the last 72 hours.   Radiology: ECHOCARDIOGRAM COMPLETE Result Date: 03/01/2024    ECHOCARDIOGRAM REPORT   Patient Name:   Jane Willis Date of Exam:  03/01/2024 Medical Rec #:  [id number]         Height:       63.0 in Accession #:    [id number]         Weight:       147.9 lb Date of Birth:  12-20-1940         BSA:          1.701 m Patient Age:    82 years          BP:           126/84 mmHg Patient Gender: F                 HR:           118 bpm. Exam Location:  [facility] Procedure: 2D Echo, Cardiac Doppler and Color Doppler (Both Spectral and Color            Flow Doppler were utilized during procedure). Indications:     Dyspnea R06.00  History:         Patient has prior history of Echocardiogram examinations, most                  recent 03/01/2024. Risk Factors:Hypertension and Dyslipidemia.  Sonographer:     Broadus Canes Referring Phys:  [id number] CARALYN HUDSON Diagnosing Phys: Sabina Custovic IMPRESSIONS  1. Left ventricular ejection fraction, by estimation, is 45 to 50%. The left ventricle has mildly decreased function. The left ventricle has no regional wall motion abnormalities. Left ventricular diastolic parameters are indeterminate.  2. Right ventricular systolic function is normal. The right ventricular size is normal. There is mildly elevated pulmonary artery systolic pressure. The estimated right ventricular systolic pressure is 42.1 mmHg.  3. Left atrial size was mildly dilated.  4. The mitral valve is abnormal. Severe mitral valve regurgitation. No evidence of mitral stenosis.  5. The tricuspid valve is abnormal. Tricuspid valve regurgitation is moderate to severe.  6. The aortic valve is normal in structure. Aortic valve regurgitation is not visualized. No aortic stenosis is present.  7. The inferior vena cava is normal in size with greater than 50% respiratory variability, suggesting right atrial pressure of 3 mmHg. FINDINGS  Left Ventricle: Left ventricular ejection fraction, by estimation, is 45 to 50%. The left ventricle has mildly decreased function. The left ventricle has no regional wall motion abnormalities. The left ventricular internal cavity size was normal in size. There is no left ventricular hypertrophy. Left ventricular diastolic  parameters are indeterminate. Right Ventricle: The right ventricular size is normal. No increase in right ventricular wall thickness. Right ventricular systolic function is normal. There is mildly elevated pulmonary artery systolic pressure. The tricuspid regurgitant velocity is 2.92  m/s, and with an assumed right atrial pressure of 8 mmHg, the estimated right ventricular systolic pressure is 42.1 mmHg. Left Atrium: Left atrial size was mildly dilated. Right Atrium: Right atrial size was normal in size. Pericardium: There is no evidence of pericardial effusion. Mitral Valve: The mitral valve is abnormal. Severe mitral valve regurgitation. No evidence of mitral valve stenosis. Tricuspid Valve: The tricuspid valve is abnormal. Tricuspid valve regurgitation is moderate to severe. Aortic Valve: The aortic valve is normal in structure. Aortic valve regurgitation is not visualized. No aortic stenosis is present. Aortic valve mean gradient measures 2.0 mmHg. Aortic valve peak gradient measures 3.5 mmHg. Aortic valve area, by VTI measures 2.33 cm. Pulmonic Valve: The pulmonic valve was normal in structure. Pulmonic valve regurgitation is not visualized.  Aorta: The aortic root is normal in size and structure. Venous: The inferior vena cava is normal in size with greater than 50% respiratory variability, suggesting right atrial pressure of 3 mmHg. IAS/Shunts: No atrial level shunt detected by color flow Doppler.  LEFT VENTRICLE PLAX 2D LVIDd:         4.30 cm LVIDs:         3.30 cm LV PW:         0.90 cm LV IVS:        1.00 cm LVOT diam:     2.00 cm LV SV:         35 LV SV Index:   21 LVOT Area:     3.14 cm  RIGHT VENTRICLE RV Basal diam:  3.30 cm RV Mid diam:    3.40 cm LEFT ATRIUM             Index        RIGHT ATRIUM           Index LA diam:        4.10 cm 2.41 cm/m   RA Area:     15.90 cm LA Vol (A2C):   45.4 ml 26.69 ml/m  RA Volume:   40.30 ml  23.69 ml/m LA Vol (A4C):   48.1 ml 28.28 ml/m LA Biplane Vol: 48.3 ml  28.39 ml/m  AORTIC VALVE AV Area (Vmax):    2.12 cm AV Area (Vmean):   2.10 cm AV Area (VTI):     2.33 cm AV Vmax:           93.30 cm/s AV Vmean:          63.000 cm/s AV VTI:            0.151 m AV Peak Grad:      3.5 mmHg AV Mean Grad:      2.0 mmHg LVOT Vmax:         63.10 cm/s LVOT Vmean:        42.200 cm/s LVOT VTI:          0.112 m LVOT/AV VTI ratio: 0.74  AORTA Ao Root diam: 2.90 cm MITRAL VALVE                  TRICUSPID VALVE MV Area (PHT): 5.09 cm       TR Peak grad:   34.1 mmHg MV Decel Time: 149 msec       TR Vmax:        292.00 cm/s MR Peak grad:    89.1 mmHg MR Mean grad:    60.0 mmHg    SHUNTS MR Vmax:         472.00 cm/s  Systemic VTI:  0.11 m MR Vmean:        367.0 cm/s   Systemic Diam: 2.00 cm MR PISA:         1.57 cm MR PISA Eff ROA: 10 mm MR PISA Radius:  0.50 cm MV E velocity: 121.00 cm/s Lanell Pinta Custovic Electronically signed by Isabell Manzanilla Signature Date/Time: 03/01/2024/3:29:29 PM    Final    DG Chest 2 View Result Date: 02/28/2024 CLINICAL DATA:  Shortness of breath for 3 weeks EXAM: CHEST - 2 VIEW COMPARISON:  None Available. FINDINGS: Normal cardiopericardial silhouette. Calcified aorta. Small pleural effusions. No pneumothorax. No consolidation. There is some interstitial changes seen lungs which could be chronic. Please correlate with any prior. Degenerative changes of the spine. IMPRESSION: Underinflation.  Small pleural effusions.  Mild interstitial prominence. These very well could be chronic. Please correlate with any prior. Electronically Signed   By: Adrianna Horde M.D.   On: 02/28/2024 15:25    ECHO as above  TELEMETRY reviewed by me 03/02/2024: atrial fibrillation, rate 110 -120s  EKG reviewed by me: Atrial fibrillation, rate 140 bpm  Data reviewed by me 03/02/2024: last 24h vitals tele labs imaging I/O hospitalist progress note.  Principal Problem:   New onset of congestive heart failure (HCC) Active Problems:   Hypertension   Prediabetes   Total knee  replacement status   Atrial fibrillation with RVR (HCC)   New onset atrial fibrillation (HCC)   Chest pressure    ASSESSMENT AND PLAN:  Jane Willis is a 83 y.o. female  with a past medical history of hypertension with no known history of heart failure or atrial fibrillation who presented to the ED on 02/28/2024 for shortness of breath, orthopnea, lower extremity swelling and cough. Cardiology was consulted for further evaluation.   # New onset AF RVR # Acute heart failure  Patient reports to ED with 3 weeks of cough, worsening SOB, orthopnea, and lower extremity edema without chest pain. Patient has no known history of heart failure or AF. EKG in ED with atrial fibrillation rate 140 bpm. Trops negative x2. BNP elevated at 659. Echo this admission EF 45-50%, mildly elevated PASP, no RWMA, severe MR, moderate to severe TR. TEE performed yesterday without DCCV due to found LA appendage thrombus. HR elevated and BP borderline this AM. Per tele remains in AF RVR. -CHADS2VASC of 4. Anticoagulation is recommended for stroke risk reduction.  -Continue Eliquis 5 mg twice daily for stroke risk reduction. -Continue metoprolol tartrate 25 mg four times daily for improved HR control. Will hold parameters. -Ordered IV digoxin load today for better rate control.  -Continue IV lasix 40 mg for 1 more dose today. Plan to transition to PO lasix 40 mg daily tomorrow. Closely monitor renal function and UOP.  -Goal K >4, Mag >2. -Due to LA appendage thrombus found on TEE. Continue anticoagulation with Eliquis. Consider to re-evaluate with TEE/DCCV in about 6-8 weeks outpatient-. -Consider outpatient referral to EP.   This patient's plan of care was discussed and created with Dr. Custovic and she is in agreement.  Signed: Creighton Doffing, PA-C  03/02/2024, 11:13 AM [facility]

## 2024-03-02 NOTE — Progress Notes (Signed)
 PROGRESS NOTE    Jane Willis  ZOX:[id number] DOB: [birth year] DOA: 02/28/2024 PCP: Jacklin Mascot, MD   Assessment & Plan:   Principal Problem:   New onset of congestive heart failure (HCC) Active Problems:   Hypertension   Prediabetes   Total knee replacement status   Atrial fibrillation with RVR (HCC)   New onset atrial fibrillation (HCC)   Chest pressure  Assessment and Plan: Acute systolic CHF: new onset. Echo shows EF 45-50%, no regional wall motion abnormalities, diastolic function is indeterminate, severe MR, mod to severe TR. Monitor I/Os. Increased metoprolol. Holding home valsartan . Cardio following and recs apprec  A. fib: new onset. Continue on eliquis & increase metoprolol dose & start digoxin as per cardio. No cardioversion on 03/01/24 b/c TEE showed left atrial appendage thrombus as per cardio. Cardio following and recs apprec  Left atrial appendage thrombus: found on TEE on 03/02/23. Continue on eliquis    HTN: metoprolol dose was increased as per cardio. Holding valsartan , amlodipine    AKI:  Cr is labile. Avoid nephrotoxic meds     DVT prophylaxis: eliquis Code Status: full  Family Communication: pt did not want me to call any family to give an update to Disposition Plan: likely d/c back home   Level of care: Telemetry Cardiac  Status is: Inpatient Remains inpatient appropriate because: severity of illness    Consultants:  Cardio   Procedures:  Antimicrobials:   Subjective: Pt c/o malaise   Objective: Vitals:   03/02/24 0700 03/02/24 0724 03/02/24 0741 03/02/24 0742  BP:   (!) 108/95   Pulse:   (!) 136 88  Resp: 20 (!) 38 19   Temp:   98.7 F (37.1 C)   TempSrc:      SpO2:   95% 97%  Weight:      Height:        Intake/Output Summary (Last 24 hours) at 03/02/2024 0831 Last data filed at 03/02/2024 0715 Gross per 24 hour  Intake 390 ml  Output 1000 ml  Net -610 ml   Filed Weights   02/28/24 1451 03/01/24 0340 03/01/24 1143   Weight: 68 kg 67.1 kg 67.1 kg    Examination:  General exam: appears anxious  Respiratory system: clear breath sounds b/l  Cardiovascular system: irregularly irregular.  Gastrointestinal system: Abd is soft, NT, ND & hypoactive bowel sounds Central nervous system: alert & oriented. Moves all extremities  Psychiatry: judgement and insight appears at baseline. Anxious mood and affect    Data Reviewed: I have personally reviewed following labs and imaging studies  CBC: Recent Labs  Lab 02/28/24 1458 02/29/24 0445 03/01/24 0303 03/02/24 0442  WBC 5.9 6.3 6.6 7.1  HGB 12.0 11.0* 11.1* 10.7*  HCT 37.6 34.5* 34.3* 32.8*  MCV 89.7 88.2 87.1 86.5  PLT 268 241 219 238   Basic Metabolic Panel: Recent Labs  Lab 02/28/24 1458 02/29/24 0445 03/01/24 0303 03/02/24 0442  NA 135 138 136 135  K 4.2 3.8 3.8 3.9  CL 103 105 102 102  CO2 22 24 25 25   GLUCOSE 103* 96 88 128*  BUN 20 20 26* 22  CREATININE 0.87 1.01* 1.13* 1.06*  CALCIUM 8.8* 8.7* 8.2* 8.4*  MG 2.1 2.1  --   --    GFR: Estimated Creatinine Clearance: 36.4 mL/min (A) (by C-G formula based on SCr of 1.06 mg/dL (H)). Liver Function Tests: No results for input(s): "AST", "ALT", "ALKPHOS", "BILITOT", "PROT", "ALBUMIN" in the last 168 hours. No results for input(s): "  LIPASE", "AMYLASE" in the last 168 hours. No results for input(s): "AMMONIA" in the last 168 hours. Coagulation Profile: Recent Labs  Lab 02/28/24 1945  INR 1.1   Cardiac Enzymes: No results for input(s): "CKTOTAL", "CKMB", "CKMBINDEX", "TROPONINI" in the last 168 hours. BNP (last 3 results) No results for input(s): "PROBNP" in the last 8760 hours. HbA1C: No results for input(s): "HGBA1C" in the last 72 hours. CBG: No results for input(s): "GLUCAP" in the last 168 hours. Lipid Profile: No results for input(s): "CHOL", "HDL", "LDLCALC", "TRIG", "CHOLHDL", "LDLDIRECT" in the last 72 hours. Thyroid  Function Tests: Recent Labs    02/29/24 0445   TSH 3.701   Anemia Panel: No results for input(s): "VITAMINB12", "FOLATE", "FERRITIN", "TIBC", "IRON", "RETICCTPCT" in the last 72 hours. Sepsis Labs: No results for input(s): "PROCALCITON", "LATICACIDVEN" in the last 168 hours.  No results found for this or any previous visit (from the past 240 hours).       Radiology Studies: ECHOCARDIOGRAM COMPLETE Result Date: 03/01/2024    ECHOCARDIOGRAM REPORT   Patient Name:   Jane Willis Date of Exam: 03/01/2024 Medical Rec #:  [id number]         Height:       63.0 in Accession #:    [id number]        Weight:       147.9 lb Date of Birth:  1940/11/11         BSA:          1.701 m Patient Age:    82 years          BP:           126/84 mmHg Patient Gender: F                 HR:           118 bpm. Exam Location:  [facility] Procedure: 2D Echo, Cardiac Doppler and Color Doppler (Both Spectral and Color            Flow Doppler were utilized during procedure). Indications:     Dyspnea R06.00  History:         Patient has prior history of Echocardiogram examinations, most                  recent 03/01/2024. Risk Factors:Hypertension and Dyslipidemia.  Sonographer:     Broadus Canes Referring Phys:  [id number] CARALYN HUDSON Diagnosing Phys: Sabina Custovic IMPRESSIONS  1. Left ventricular ejection fraction, by estimation, is 45 to 50%. The left ventricle has mildly decreased function. The left ventricle has no regional wall motion abnormalities. Left ventricular diastolic parameters are indeterminate.  2. Right ventricular systolic function is normal. The right ventricular size is normal. There is mildly elevated pulmonary artery systolic pressure. The estimated right ventricular systolic pressure is 42.1 mmHg.  3. Left atrial size was mildly dilated.  4. The mitral valve is abnormal. Severe mitral valve regurgitation. No evidence of mitral stenosis.  5. The tricuspid valve is abnormal. Tricuspid valve regurgitation is moderate to severe.  6. The aortic valve is normal in  structure. Aortic valve regurgitation is not visualized. No aortic stenosis is present.  7. The inferior vena cava is normal in size with greater than 50% respiratory variability, suggesting right atrial pressure of 3 mmHg. FINDINGS  Left Ventricle: Left ventricular ejection fraction, by estimation, is 45 to 50%. The left ventricle has mildly decreased function. The left ventricle has no regional wall motion abnormalities. The left ventricular  internal cavity size was normal in size. There is no left ventricular hypertrophy. Left ventricular diastolic parameters are indeterminate. Right Ventricle: The right ventricular size is normal. No increase in right ventricular wall thickness. Right ventricular systolic function is normal. There is mildly elevated pulmonary artery systolic pressure. The tricuspid regurgitant velocity is 2.92  m/s, and with an assumed right atrial pressure of 8 mmHg, the estimated right ventricular systolic pressure is 42.1 mmHg. Left Atrium: Left atrial size was mildly dilated. Right Atrium: Right atrial size was normal in size. Pericardium: There is no evidence of pericardial effusion. Mitral Valve: The mitral valve is abnormal. Severe mitral valve regurgitation. No evidence of mitral valve stenosis. Tricuspid Valve: The tricuspid valve is abnormal. Tricuspid valve regurgitation is moderate to severe. Aortic Valve: The aortic valve is normal in structure. Aortic valve regurgitation is not visualized. No aortic stenosis is present. Aortic valve mean gradient measures 2.0 mmHg. Aortic valve peak gradient measures 3.5 mmHg. Aortic valve area, by VTI measures 2.33 cm. Pulmonic Valve: The pulmonic valve was normal in structure. Pulmonic valve regurgitation is not visualized. Aorta: The aortic root is normal in size and structure. Venous: The inferior vena cava is normal in size with greater than 50% respiratory variability, suggesting right atrial pressure of 3 mmHg. IAS/Shunts: No atrial level  shunt detected by color flow Doppler.  LEFT VENTRICLE PLAX 2D LVIDd:         4.30 cm LVIDs:         3.30 cm LV PW:         0.90 cm LV IVS:        1.00 cm LVOT diam:     2.00 cm LV SV:         35 LV SV Index:   21 LVOT Area:     3.14 cm  RIGHT VENTRICLE RV Basal diam:  3.30 cm RV Mid diam:    3.40 cm LEFT ATRIUM             Index        RIGHT ATRIUM           Index LA diam:        4.10 cm 2.41 cm/m   RA Area:     15.90 cm LA Vol (A2C):   45.4 ml 26.69 ml/m  RA Volume:   40.30 ml  23.69 ml/m LA Vol (A4C):   48.1 ml 28.28 ml/m LA Biplane Vol: 48.3 ml 28.39 ml/m  AORTIC VALVE AV Area (Vmax):    2.12 cm AV Area (Vmean):   2.10 cm AV Area (VTI):     2.33 cm AV Vmax:           93.30 cm/s AV Vmean:          63.000 cm/s AV VTI:            0.151 m AV Peak Grad:      3.5 mmHg AV Mean Grad:      2.0 mmHg LVOT Vmax:         63.10 cm/s LVOT Vmean:        42.200 cm/s LVOT VTI:          0.112 m LVOT/AV VTI ratio: 0.74  AORTA Ao Root diam: 2.90 cm MITRAL VALVE                  TRICUSPID VALVE MV Area (PHT): 5.09 cm       TR Peak grad:   34.1 mmHg MV  Decel Time: 149 msec       TR Vmax:        292.00 cm/s MR Peak grad:    89.1 mmHg MR Mean grad:    60.0 mmHg    SHUNTS MR Vmax:         472.00 cm/s  Systemic VTI:  0.11 m MR Vmean:        367.0 cm/s   Systemic Diam: 2.00 cm MR PISA:         1.57 cm MR PISA Eff ROA: 10 mm MR PISA Radius:  0.50 cm MV E velocity: 121.00 cm/s Designer, multimedia signed by Isabell Manzanilla Signature Date/Time: 03/01/2024/3:29:29 PM    Final         Scheduled Meds:  apixaban  5 mg Oral BID   aspirin  EC  81 mg Oral Daily   dextromethorphan-guaiFENesin  1 tablet Oral BID   furosemide  40 mg Intravenous BID   metoprolol tartrate  25 mg Oral QID   Continuous Infusions:   LOS: 3 days      Alphonsus Jeans, MD [facility] Pager [phone]  If 7PM-7AM, please contact night-coverage [email or web address] 03/02/2024, 8:31 AM

## 2024-03-02 NOTE — Plan of Care (Signed)

## 2024-03-03 ENCOUNTER — Other Ambulatory Visit: Payer: Self-pay

## 2024-03-03 ENCOUNTER — Telehealth (HOSPITAL_COMMUNITY): Payer: Self-pay

## 2024-03-03 ENCOUNTER — Other Ambulatory Visit (HOSPITAL_COMMUNITY): Payer: Self-pay

## 2024-03-03 LAB — CBC
HCT: 35.7 % — ABNORMAL LOW (ref 36.0–46.0)
Hemoglobin: 11.6 g/dL — ABNORMAL LOW (ref 12.0–15.0)
MCH: 28.4 pg (ref 26.0–34.0)
MCHC: 32.5 g/dL (ref 30.0–36.0)
MCV: 87.3 fL (ref 80.0–100.0)
Platelets: 270 10*3/uL (ref 150–400)
RBC: 4.09 MIL/uL (ref 3.87–5.11)
RDW: 14 % (ref 11.5–15.5)
WBC: 6.6 10*3/uL (ref 4.0–10.5)
nRBC: 0 % (ref 0.0–0.2)

## 2024-03-03 LAB — BASIC METABOLIC PANEL WITH GFR
Anion gap: 10 (ref 5–15)
BUN: 24 mg/dL — ABNORMAL HIGH (ref 8–23)
CO2: 26 mmol/L (ref 22–32)
Calcium: 8.4 mg/dL — ABNORMAL LOW (ref 8.9–10.3)
Chloride: 101 mmol/L (ref 98–111)
Creatinine, Ser: 0.98 mg/dL (ref 0.44–1.00)
GFR, Estimated: 57 mL/min — ABNORMAL LOW (ref >60)
Glucose, Bld: 87 mg/dL (ref 70–99)
Potassium: 3.7 mmol/L (ref 3.5–5.1)
Sodium: 137 mmol/L (ref 135–145)

## 2024-03-03 MED ORDER — APIXABAN 5 MG PO TABS
5.0000 mg | ORAL_TABLET | Freq: Two times a day (BID) | ORAL | 0 refills | Status: DC
  Filled 2024-03-03: qty 60, 30d supply, fill #0

## 2024-03-03 MED ORDER — FUROSEMIDE 20 MG PO TABS
20.0000 mg | ORAL_TABLET | Freq: Every day | ORAL | 0 refills | Status: DC
  Filled 2024-03-03: qty 30, 30d supply, fill #0

## 2024-03-03 MED ORDER — EMPAGLIFLOZIN 10 MG PO TABS
10.0000 mg | ORAL_TABLET | Freq: Every day | ORAL | Status: DC
  Administered 2024-03-03: 10 mg via ORAL
  Filled 2024-03-03: qty 1

## 2024-03-03 MED ORDER — FUROSEMIDE 20 MG PO TABS
20.0000 mg | ORAL_TABLET | Freq: Every day | ORAL | Status: DC
  Administered 2024-03-03: 20 mg via ORAL
  Filled 2024-03-03: qty 1

## 2024-03-03 MED ORDER — METOPROLOL TARTRATE 25 MG PO TABS
25.0000 mg | ORAL_TABLET | Freq: Four times a day (QID) | ORAL | 0 refills | Status: DC
  Filled 2024-03-03: qty 120, 30d supply, fill #0

## 2024-03-03 MED ORDER — EMPAGLIFLOZIN 10 MG PO TABS
10.0000 mg | ORAL_TABLET | Freq: Every day | ORAL | 0 refills | Status: AC
  Filled 2024-03-03: qty 30, 30d supply, fill #0

## 2024-03-03 NOTE — Discharge Summary (Signed)
 Physician Discharge Summary  [name] MVH:[id number] DOB: 1941/02/20 DOA: 02/28/2024  PCP: Jacklin Mascot, MD  Admit date: 02/28/2024 Discharge date: 03/03/2024  Admitted From: home  Disposition:  home   Recommendations for Outpatient Follow-up:  Follow up with PCP in 1-2 weeks F/u w/ cardio, Dr. Braxton Calico, in 1 week   Home Health: no  Equipment/Devices:  Discharge Condition: stable  CODE STATUS:full  Diet recommendation: Heart Healthy  Brief/Interim Summary: HPI was taken from Dr. Reinhold Carbine: [name] is a 83 year old female with history of hypertension, history of left total knee arthroplasty, who presents emergency department for chief concerns of shortness of breath from [facility].   Vitals in the ED showed T of 98, rr 17, hr 103, blood pressure 117/95, SpO2 of 93% on room air.   Serum sodium is 135, potassium 4.2, chloride 103, bicarb 22, BUN of 20, serum creatinine of 2.87, EGFR greater than 60, nonfasting blood glucose 103, WBC 5.9, hemoglobin 12, platelets of 268.   BNP was elevated at 659.4.   ED treatment: Furosemide 40 mg IV one-time dose, metoprolol 5 mg IV one-time dose. ------------------------------------- At bedside, patient was able to tell me her first and last name, age, location, current calendar year.   She reports that over the last 3-3 and half weeks, she has been having increasing congestion and runny nose.  She initially thought it was allergies.  She endorses associated shortness of breath and cough that was initially productive of white sputum however now it is white sputum.   She denies fever, chills, current chest pain, abdominal pain, dysuria, hematuria, diarrhea.  She endorses swelling of her lower extremities, for the past 2 to 3 weeks.   She endorses chest pressure especially with exertion over the last 2 to 3 weeks.  She denies current chest pain, chest pressure at this time.  Discharge Diagnoses:  Principal Problem:   New  onset of congestive heart failure (HCC) Active Problems:   Hypertension   Prediabetes   Total knee replacement status   Atrial fibrillation with RVR (HCC)   New onset atrial fibrillation (HCC)   Chest pressure  Acute systolic CHF: new onset. Echo shows EF 45-50%, no regional wall motion abnormalities, diastolic function is indeterminate, severe MR, mod to severe TR. Monitor I/Os. Continue on metoprolol, lasix, jardiance & continue holding home valsartan  as per cardio. Cardio following and recs apprec  A. fib: new onset. Continue on eliquis, metoprolol as per cardio. No cardioversion on 03/01/24 b/c TEE showed left atrial appendage thrombus as per cardio. Cardio following and recs apprec  Left atrial appendage thrombus: found on TEE on 03/02/23. Continue on eliquis    HTN: continue on metoprolol. Holding valsartan , amlodipine    AKI:  Cr is labile. Avoid nephrotoxic meds   Discharge Instructions  Discharge Instructions     Diet - low sodium heart healthy   Complete by: As directed    Discharge instructions   Complete by: As directed    F/u w/ PCP in 1-2 weeks. F/u w/ cardio, Dr. Braxton Calico, in 1 week   Increase activity slowly   Complete by: As directed       Allergies as of 03/03/2024       Reactions   Gabapentin    Balance issues   Ibuprofen Other (See Comments)   hypertension        Medication List     STOP taking these medications    amLODipine -valsartan  10-160 MG tablet Commonly known as: EXFORGE   aspirin  EC 81 MG tablet   celecoxib  200 MG capsule Commonly known as: CELEBREX    cyanocobalamin  1000 MCG tablet Commonly known as: VITAMIN B12   diclofenac Sodium 1 % Gel Commonly known as: VOLTAREN   ondansetron  4 MG tablet Commonly known as: Zofran    oxyCODONE  5 MG immediate release tablet Commonly known as: Oxy IR/ROXICODONE    Psyllium Husk Powd   SYSTANE OP   traMADol  50 MG tablet Commonly known as: ULTRAM    vitamin C 1000 MG tablet        TAKE these medications    acetaminophen  500 MG tablet Commonly known as: TYLENOL  Take 1,000 mg by mouth as needed for moderate pain.   apixaban 5 MG Tabs tablet Commonly known as: ELIQUIS Take 1 tablet (5 mg total) by mouth 2 (two) times daily.   empagliflozin 10 MG Tabs tablet Commonly known as: JARDIANCE Take 1 tablet (10 mg total) by mouth daily. Start taking on: Mar 04, 2024   furosemide 20 MG tablet Commonly known as: LASIX Take 1 tablet (20 mg total) by mouth daily. Start taking on: Mar 04, 2024   metoprolol tartrate 25 MG tablet Commonly known as: LOPRESSOR Take 1 tablet (25 mg total) by mouth 4 (four) times daily.   senna 8.6 MG Tabs tablet Commonly known as: SENOKOT Take 1 tablet (8.6 mg total) by mouth daily as needed for mild constipation.   Vitamin D -3 25 MCG (1000 UT) Caps Take 2,000 capsules by mouth daily.        Follow-up Information     Custovic, Lanell Pinta, DO. Go in 1 week(s).   Specialty: Cardiology Contact information: [address] [place] [address] [phone]                Allergies  Allergen Reactions   Gabapentin     Balance issues   Ibuprofen Other (See Comments)    hypertension    Consultations: Cardio    Procedures/Studies: ECHO TEE Result Date: 03/02/2024    TRANSESOPHOGEAL ECHO REPORT   Patient Name:   Jane Willis Date of Exam: 03/01/2024 Medical Rec #:  [id number]         Height:       63.0 in Accession #:    [id number]        Weight:       148.0 lb Date of Birth:  [birth year]         BSA:          1.701 m Patient Age:    82 years          BP:           123/81 mmHg Patient Gender: F                 HR:           116 bpm. Exam Location:  [facility] Procedure: Transesophageal Echo, Cardiac Doppler and Color Doppler (Both            Spectral and Color Flow Doppler were utilized during procedure). Indications:     Not listed on TEE check-in sheet  History:         Patient has no prior history of Echocardiogram  examinations.                  Risk Factors:Hypertension and Dyslipidemia.  Sonographer:     Broadus Canes Referring Phys:  [id number] GABRIELLA DECOSTE Diagnosing Phys: Lanell Pinta Custovic PROCEDURE: The transesophogeal probe was passed without difficulty through  the esophogus of the patient. Sedation performed by different physician. The patient's vital signs; including heart rate, blood pressure, and oxygen saturation; remained stable throughout the procedure. The patient developed no complications during the procedure.  IMPRESSIONS  1. Left ventricular ejection fraction, by estimation, is 45 to 50%. The left ventricle has mildly decreased function. The left ventricle has no regional wall motion abnormalities.  2. Right ventricular systolic function is normal. The right ventricular size is normal.  3. A left atrial/left atrial appendage thrombus was detected.  4. The mitral valve was not assessed.  5. The aortic valve was not assessed.  6. The inferior vena cava is normal in size with greater than 50% respiratory variability, suggesting right atrial pressure of 3 mmHg. Conclusion(s)/Recommendation(s): Findings concerning for LA/LAA thrombus. Cardioversion was not performed. Would recommend 4 weeks of anticoagulation prior to further attempts at cardioversion. FINDINGS  Left Ventricle: Left ventricular ejection fraction, by estimation, is 45 to 50%. The left ventricle has mildly decreased function. The left ventricle has no regional wall motion abnormalities. The left ventricular internal cavity size was normal in size. There is no left ventricular hypertrophy. Right Ventricle: The right ventricular size is normal. No increase in right ventricular wall thickness. Right ventricular systolic function is normal. Left Atrium: Left atrial size was normal in size. A left atrial/left atrial appendage thrombus was detected. Right Atrium: Right atrial size was normal in size. Pericardium: There is no evidence of pericardial  effusion. Mitral Valve: The mitral valve was not assessed. Tricuspid Valve: The tricuspid valve is not assessed. Aortic Valve: The aortic valve was not assessed. Pulmonic Valve: The pulmonic valve was not assessed. Aorta: The aortic root is normal in size and structure. Venous: The inferior vena cava is normal in size with greater than 50% respiratory variability, suggesting right atrial pressure of 3 mmHg. IAS/Shunts: No atrial level shunt detected by color flow Doppler. Sabina Custovic Electronically signed by Isabell Manzanilla Signature Date/Time: 03/02/2024/1:50:20 PM    Final    ECHOCARDIOGRAM COMPLETE Result Date: 03/01/2024    ECHOCARDIOGRAM REPORT   Patient Name:   Jane Willis Date of Exam: 03/01/2024 Medical Rec #:  [id number]         Height:       63.0 in Accession #:    [id number]        Weight:       147.9 lb Date of Birth:  1941-05-29         BSA:          1.701 m Patient Age:    82 years          BP:           126/84 mmHg Patient Gender: F                 HR:           118 bpm. Exam Location:  [facility] Procedure: 2D Echo, Cardiac Doppler and Color Doppler (Both Spectral and Color            Flow Doppler were utilized during procedure). Indications:     Dyspnea R06.00  History:         Patient has prior history of Echocardiogram examinations, most                  recent 03/01/2024. Risk Factors:Hypertension and Dyslipidemia.  Sonographer:     Broadus Canes Referring Phys:  [id number] CARALYN HUDSON Diagnosing Phys: Sabina Custovic IMPRESSIONS  1. Left  ventricular ejection fraction, by estimation, is 45 to 50%. The left ventricle has mildly decreased function. The left ventricle has no regional wall motion abnormalities. Left ventricular diastolic parameters are indeterminate.  2. Right ventricular systolic function is normal. The right ventricular size is normal. There is mildly elevated pulmonary artery systolic pressure. The estimated right ventricular systolic pressure is 42.1 mmHg.  3. Left atrial size  was mildly dilated.  4. The mitral valve is abnormal. Severe mitral valve regurgitation. No evidence of mitral stenosis.  5. The tricuspid valve is abnormal. Tricuspid valve regurgitation is moderate to severe.  6. The aortic valve is normal in structure. Aortic valve regurgitation is not visualized. No aortic stenosis is present.  7. The inferior vena cava is normal in size with greater than 50% respiratory variability, suggesting right atrial pressure of 3 mmHg. FINDINGS  Left Ventricle: Left ventricular ejection fraction, by estimation, is 45 to 50%. The left ventricle has mildly decreased function. The left ventricle has no regional wall motion abnormalities. The left ventricular internal cavity size was normal in size. There is no left ventricular hypertrophy. Left ventricular diastolic parameters are indeterminate. Right Ventricle: The right ventricular size is normal. No increase in right ventricular wall thickness. Right ventricular systolic function is normal. There is mildly elevated pulmonary artery systolic pressure. The tricuspid regurgitant velocity is 2.92  m/s, and with an assumed right atrial pressure of 8 mmHg, the estimated right ventricular systolic pressure is 42.1 mmHg. Left Atrium: Left atrial size was mildly dilated. Right Atrium: Right atrial size was normal in size. Pericardium: There is no evidence of pericardial effusion. Mitral Valve: The mitral valve is abnormal. Severe mitral valve regurgitation. No evidence of mitral valve stenosis. Tricuspid Valve: The tricuspid valve is abnormal. Tricuspid valve regurgitation is moderate to severe. Aortic Valve: The aortic valve is normal in structure. Aortic valve regurgitation is not visualized. No aortic stenosis is present. Aortic valve mean gradient measures 2.0 mmHg. Aortic valve peak gradient measures 3.5 mmHg. Aortic valve area, by VTI measures 2.33 cm. Pulmonic Valve: The pulmonic valve was normal in structure. Pulmonic valve regurgitation  is not visualized. Aorta: The aortic root is normal in size and structure. Venous: The inferior vena cava is normal in size with greater than 50% respiratory variability, suggesting right atrial pressure of 3 mmHg. IAS/Shunts: No atrial level shunt detected by color flow Doppler.  LEFT VENTRICLE PLAX 2D LVIDd:         4.30 cm LVIDs:         3.30 cm LV PW:         0.90 cm LV IVS:        1.00 cm LVOT diam:     2.00 cm LV SV:         35 LV SV Index:   21 LVOT Area:     3.14 cm  RIGHT VENTRICLE RV Basal diam:  3.30 cm RV Mid diam:    3.40 cm LEFT ATRIUM             Index        RIGHT ATRIUM           Index LA diam:        4.10 cm 2.41 cm/m   RA Area:     15.90 cm LA Vol (A2C):   45.4 ml 26.69 ml/m  RA Volume:   40.30 ml  23.69 ml/m LA Vol (A4C):   48.1 ml 28.28 ml/m LA Biplane Vol: 48.3 ml 28.39  ml/m  AORTIC VALVE AV Area (Vmax):    2.12 cm AV Area (Vmean):   2.10 cm AV Area (VTI):     2.33 cm AV Vmax:           93.30 cm/s AV Vmean:          63.000 cm/s AV VTI:            0.151 m AV Peak Grad:      3.5 mmHg AV Mean Grad:      2.0 mmHg LVOT Vmax:         63.10 cm/s LVOT Vmean:        42.200 cm/s LVOT VTI:          0.112 m LVOT/AV VTI ratio: 0.74  AORTA Ao Root diam: 2.90 cm MITRAL VALVE                  TRICUSPID VALVE MV Area (PHT): 5.09 cm       TR Peak grad:   34.1 mmHg MV Decel Time: 149 msec       TR Vmax:        292.00 cm/s MR Peak grad:    89.1 mmHg MR Mean grad:    60.0 mmHg    SHUNTS MR Vmax:         472.00 cm/s  Systemic VTI:  0.11 m MR Vmean:        367.0 cm/s   Systemic Diam: 2.00 cm MR PISA:         1.57 cm MR PISA Eff ROA: 10 mm MR PISA Radius:  0.50 cm MV E velocity: 121.00 cm/s Lanell Pinta Custovic Electronically signed by Isabell Manzanilla Signature Date/Time: 03/01/2024/3:29:29 PM    Final    DG Chest 2 View Result Date: 02/28/2024 CLINICAL DATA:  Shortness of breath for 3 weeks EXAM: CHEST - 2 VIEW COMPARISON:  None Available. FINDINGS: Normal cardiopericardial silhouette. Calcified aorta.  Small pleural effusions. No pneumothorax. No consolidation. There is some interstitial changes seen lungs which could be chronic. Please correlate with any prior. Degenerative changes of the spine. IMPRESSION: Underinflation.  Small pleural effusions. Mild interstitial prominence. These very well could be chronic. Please correlate with any prior. Electronically Signed   By: Adrianna Horde M.D.   On: 02/28/2024 15:25   (Echo, Carotid, EGD, Colonoscopy, ERCP)    Subjective: Pt denies any chest pain or shortness of breath    Discharge Exam: Vitals:   03/03/24 0828 03/03/24 1208  BP: 134/74 134/73  Pulse: 64 66  Resp: 20 20  Temp: 98.4 F (36.9 C) (!) 97.5 F (36.4 C)  SpO2: 96% 93%   Vitals:   03/03/24 0319 03/03/24 0503 03/03/24 0828 03/03/24 1208  BP: 123/78  134/74 134/73  Pulse: 92  64 66  Resp: 18  20 20   Temp: 98.5 F (36.9 C)  98.4 F (36.9 C) (!) 97.5 F (36.4 C)  TempSrc:   Oral Oral  SpO2: 95%  96% 93%  Weight:  60.7 kg    Height:        General: Pt is alert, awake, not in acute distress Cardiovascular: irregularly irregular, no rubs, no gallops Respiratory: decreased breath sounds b/l  Abdominal: Soft, NT, ND, bowel sounds + Extremities: no cyanosis    The results of significant diagnostics from this hospitalization (including imaging, microbiology, ancillary and laboratory) are listed below for reference.     Microbiology: No results found for this or any previous visit (from the past 240  hours).   Labs: BNP (last 3 results) Recent Labs    02/28/24 1458  BNP 659.4*   Basic Metabolic Panel: Recent Labs  Lab 02/28/24 1458 02/29/24 0445 03/01/24 0303 03/02/24 0442 03/03/24 0524  NA 135 138 136 135 137  K 4.2 3.8 3.8 3.9 3.7  CL 103 105 102 102 101  CO2 22 24 25 25 26   GLUCOSE 103* 96 88 128* 87  BUN 20 20 26* 22 24*  CREATININE 0.87 1.01* 1.13* 1.06* 0.98  CALCIUM 8.8* 8.7* 8.2* 8.4* 8.4*  MG 2.1 2.1  --   --   --    Liver Function  Tests: No results for input(s): "AST", "ALT", "ALKPHOS", "BILITOT", "PROT", "ALBUMIN" in the last 168 hours. No results for input(s): "LIPASE", "AMYLASE" in the last 168 hours. No results for input(s): "AMMONIA" in the last 168 hours. CBC: Recent Labs  Lab 02/28/24 1458 02/29/24 0445 03/01/24 0303 03/02/24 0442 03/03/24 0524  WBC 5.9 6.3 6.6 7.1 6.6  HGB 12.0 11.0* 11.1* 10.7* 11.6*  HCT 37.6 34.5* 34.3* 32.8* 35.7*  MCV 89.7 88.2 87.1 86.5 87.3  PLT 268 241 219 238 270   Cardiac Enzymes: No results for input(s): "CKTOTAL", "CKMB", "CKMBINDEX", "TROPONINI" in the last 168 hours. BNP: Invalid input(s): "POCBNP" CBG: No results for input(s): "GLUCAP" in the last 168 hours. D-Dimer No results for input(s): "DDIMER" in the last 72 hours. Hgb A1c No results for input(s): "HGBA1C" in the last 72 hours. Lipid Profile No results for input(s): "CHOL", "HDL", "LDLCALC", "TRIG", "CHOLHDL", "LDLDIRECT" in the last 72 hours. Thyroid  function studies No results for input(s): "TSH", "T4TOTAL", "T3FREE", "THYROIDAB" in the last 72 hours.  Invalid input(s): "FREET3" Anemia work up No results for input(s): "VITAMINB12", "FOLATE", "FERRITIN", "TIBC", "IRON", "RETICCTPCT" in the last 72 hours. Urinalysis    Component Value Date/Time   COLORURINE YELLOW (A) 05/24/2023 1400   APPEARANCEUR CLOUDY (A) 05/24/2023 1400   LABSPEC 1.014 05/24/2023 1400   PHURINE 5.0 05/24/2023 1400   GLUCOSEU NEGATIVE 05/24/2023 1400   HGBUR NEGATIVE 05/24/2023 1400   BILIRUBINUR NEGATIVE 05/24/2023 1400   KETONESUR NEGATIVE 05/24/2023 1400   PROTEINUR NEGATIVE 05/24/2023 1400   NITRITE POSITIVE (A) 05/24/2023 1400   LEUKOCYTESUR LARGE (A) 05/24/2023 1400   Sepsis Labs Recent Labs  Lab 02/29/24 0445 03/01/24 0303 03/02/24 0442 03/03/24 0524  WBC 6.3 6.6 7.1 6.6   Microbiology No results found for this or any previous visit (from the past 240 hours).   Time coordinating discharge: Over 30  minutes  SIGNED:   Alphonsus Jeans, MD  [facility] 03/03/2024, 1:14 PM Pager   If 7PM-7AM, please contact night-coverage [email or web address]

## 2024-03-03 NOTE — Progress Notes (Addendum)
 Heart Failure Stewardship Pharmacy Note  PCP: Jacklin Mascot, MD PCP-Cardiologist: None  HPI: Jane Willis is a 83 y.o. female with HTN and hyperlipidemia who presented with shortness of breath, orthopnea, LEE, and cough. On admission, BNP was 659.4 and HS-troponin was 8. Chest x-ray noted underinflation and small pleural effusions.    Pertinent cardiac history: Echo this admission showed EF 45-50% with mildly elevated PASP, severe MR, and moderate/severe TR. TEE this admission showed LA appendage thrombus.   Pertinent Lab Values: Creat  Date Value Ref Range Status  12/04/2022 0.80 0.60 - 0.95 mg/dL Final   Creatinine, Ser  Date Value Ref Range Status  03/03/2024 0.98 0.44 - 1.00 mg/dL Final   BUN  Date Value Ref Range Status  03/03/2024 24 (H) 8 - 23 mg/dL Final   Potassium  Date Value Ref Range Status  03/03/2024 3.7 3.5 - 5.1 mmol/L Final   Sodium  Date Value Ref Range Status  03/03/2024 137 135 - 145 mmol/L Final   B Natriuretic Peptide  Date Value Ref Range Status  02/28/2024 659.4 (H) 0.0 - 100.0 pg/mL Final    Comment:    Performed at [facility], [address] [place], [place] [address]   Magnesium   Date Value Ref Range Status  02/29/2024 2.1 1.7 - 2.4 mg/dL Final    Comment:    Performed at [facility], [address] [place], [place] [address]   Hgb A1c MFr Bld  Date Value Ref Range Status  10/22/2023 6.2 4.6 - 6.5 % Final    Comment:    Glycemic Control Guidelines for People with Diabetes:Non Diabetic:  <6%Goal of Therapy: <7%Additional Action Suggested:  >8%    TSH  Date Value Ref Range Status  02/29/2024 3.701 0.350 - 4.500 uIU/mL Final    Comment:    Performed by a 3rd Generation assay with a functional sensitivity of <=0.01 uIU/mL. Performed at [facility], [address] [place], [place] [address]   04/02/2016 1.33 0.35 - 4.50 uIU/mL Final    Vital Signs: Admission weight: 67.1 kg  Temp:  [98.3 F  (36.8 C)-99 F (37.2 C)] 98.4 F (36.9 C) (05/16 0828) Pulse Rate:  [64-96] 64 (05/16 0828) Cardiac Rhythm: Atrial fibrillation (05/16 0842) Resp:  [17-20] 20 (05/16 0828) BP: (106-134)/(74-87) 134/74 (05/16 0828) SpO2:  [94 %-96 %] 96 % (05/16 0828) Weight:  [60.7 kg (133 lb 12.8 oz)] 60.7 kg (133 lb 12.8 oz) (05/16 0503)  Intake/Output Summary (Last 24 hours) at 03/03/2024 1008 Last data filed at 03/03/2024 0325 Gross per 24 hour  Intake --  Output 1600 ml  Net -1600 ml   Current Heart Failure Medications:  Loop diuretic: furosemide 20 mg daily Beta-Blocker: metoprolol tartrate 25 mg QID  ACEI/ARB/ARNI: none MRA: none SGLT2i: Jardiance 10 mg daily  Other: digoxin 0.125 mg daily   Prior to admission Heart Failure Medications:  Loop diuretic: none Beta-Blocker: none ACEI/ARB/ARNI: none MRA: none SGLT2i: none Other: amlodipine -valsartan  10-160 mg daily   Assessment: 1. Acute on chronic systolic heart failure (LVEF 45-50%)  , due to likely NICM. NYHA class II symptoms.  -Symptoms: Patient reports improvement in dyspnea, orthopnea, and LEE  since admission.  -Volume: Appears euvolemic. Urine output ~2 L yesterday. Creatinine down from yesterday. Started on PO furosemide today. -Hemodynamics: BP normal to high. HR 60-90s.  -BB: Continue metoprolol tartrate 25 mg QID for rate control. Consider consolidation to metoprolol succinate prior to discharge.  -ACEI/ARB/ARNI: Can consider adding Entresto vs  losartan  outpatient.  -MRA: Consider starting spironolactone 12.5 mg daily. Will help maintain normokalemia. -SGLT2i: Started Jardiance 10 mg daily today. Approved for [employer] grant. -Patient requiring digoxin for rate control at this time. Given patient age and likelihood for accumulation, would recommend an early level tomorrow if still inpatient and at first follow-up. Would recommend oral maintenance dose no higher than 0.0625.  Plan: 1) Medication changes recommended at this  time: - Agree with starting Jardiance 10 mg daily - Can consider starting spironolactone 12.5 mg daily.  - Can consider consolidating to metoprolol succinate at discharge if dose is stable  2) Patient assistance: - Entresto copay $542.31, Farxiga copay $516.67, Jardiance copay $523.90   3) Education: -To be completed prior to discharge. - Patient has been educated on current HF medications and potential additions to HF medication regimen - Patient verbalizes understanding that over the next few months, these medication doses may change and more medications may be added to optimize HF regimen - Patient has been educated on basic disease state pathophysiology and goals of therapy   Medication Assistance / Insurance Benefits Check: Does the patient have prescription insurance?  Medicare, AARP  Type of insurance plan:  Does the patient qualify for medication assistance through manufacturers or grants? Yes  Eligible grants and/or patient assistance programs: [employer]  Card No: [id number] Card Status: Active  BIN: [id number]  PCN: [id number]  PC Group:  [id number]   Outpatient Pharmacy: Prior to admission outpatient pharmacy: [facility]   Galvin Jules, Student Pharmacist   Agree with the information outlined above. Please do not hesitate to reach out with questions or concerns,  Bevely Brush, PharmD, CPP, BCPS Heart Failure Pharmacist  Phone - [phone] 03/03/2024 10:26 AM

## 2024-03-03 NOTE — Plan of Care (Signed)
 This RN provided discharge instructions and teaching to the patient. The patient verbalized and demonstrated understanding of the provided information. All outstanding questions resolved. R arm PIV removed. Cannula intact. Pt tolerated well. All belongings packed and in tow. Patient awaiting medication delivery per pharmacy.  Problem: Education: Goal: Knowledge of General Education information will improve Description: Including pain rating scale, medication(s)/side effects and non-pharmacologic comfort measures Outcome: Completed/Met   Problem: Health Behavior/Discharge Planning: Goal: Ability to manage health-related needs will improve Outcome: Completed/Met   Problem: Clinical Measurements: Goal: Ability to maintain clinical measurements within normal limits will improve Outcome: Completed/Met Goal: Will remain free from infection Outcome: Completed/Met Goal: Diagnostic test results will improve Outcome: Completed/Met Goal: Respiratory complications will improve Outcome: Completed/Met Goal: Cardiovascular complication will be avoided Outcome: Completed/Met

## 2024-03-03 NOTE — TOC Progression Note (Signed)
 Transition of Care [facility]) - Progression Note    Patient Details  Name: Jane Willis MRN: [id number] Date of Birth: 29-Aug-1941  Transition of Care [facility]) CM/SW Contact  Baird Bombard, RN Phone Number: 03/03/2024, 9:14 AM  Clinical Narrative:    TOC continuing to follow patient's progress throughout discharge planning.        Expected Discharge Plan and Services                                               Social Determinants of Health (SDOH) Interventions SDOH Screenings   Food Insecurity: No Food Insecurity (02/29/2024)  Housing: Low Risk  (02/29/2024)  Transportation Needs: No Transportation Needs (02/29/2024)  Utilities: Not At Risk (02/29/2024)  Alcohol  Screen: Low Risk  (03/11/2023)  Depression (PHQ2-9): Low Risk  (04/21/2023)  Financial Resource Strain: Low Risk  (12/07/2023)   Received from [facility]  Physical Activity: Inactive (03/11/2023)  Social Connections: Moderately Integrated (02/29/2024)  Stress: No Stress Concern Present (03/11/2023)  Tobacco Use: Low Risk  (03/01/2024)    Readmission Risk Interventions     No data to display

## 2024-03-03 NOTE — Progress Notes (Signed)
 [facility] CLINIC CARDIOLOGY PROGRESS NOTE       Patient ID: Jane Willis MRN: [id number] DOB/AGE: Mar 04, 1941 83 y.o.  Admit date: 02/28/2024 Referring Physician Dr. Joette Mustard Primary Physician Bair, Randa Burton, MD Primary Cardiologist None Reason for Consultation Acute heart failure, AF RVR  HPI: Jane Willis is a 83 y.o. female  with a past medical history of hypertension with no known history of heart failure or atrial fibrillation who presented to the ED on 02/28/2024 for shortness of breath, orthopnea, lower extremity swelling and cough. Cardiology was consulted for further evaluation.   Interval History: -Patient seen and examined this AM and pt sitting in bedside chair. Patient states she's feeling well and denies CP, SOB or palpitations. LE swelling is back to baseline. Patient near euvolemia.  -Patients BP and HR stable this AM. Per tele patient remains AF with improved HR at 70-80s -Electrolytes are stable.  -Patient remains on room air with stable SpO2.    Review of systems complete and found to be negative unless listed above    Past Medical History:  Diagnosis Date   Allergy    Seasonal   Arthritis    Chicken pox    Hyperlipidemia    Hypertension    PONV (postoperative nausea and vomiting)    Urinary incontinence     Past Surgical History:  Procedure Laterality Date   ABDOMINAL HYSTERECTOMY  1986   ANTERIOR LATERAL LUMBAR FUSION WITH PERCUTANEOUS SCREW 1 LEVEL N/A 12/10/2021   Procedure: L4-5 LATERAL INTERBODY FUSION WITH POSTERIOR PERCUTANEOUS FIXATION;  Surgeon: Jodeen Munch, MD;  Location: [facility] ORS;  Service: Neurosurgery;  Laterality: N/A;   APPLICATION OF INTRAOPERATIVE CT SCAN N/A 12/10/2021   Procedure: APPLICATION OF INTRAOPERATIVE CT SCAN;  Surgeon: Jodeen Munch, MD;  Location: [facility] ORS;  Service: Neurosurgery;  Laterality: N/A;   BREAST EXCISIONAL BIOPSY Left 1970's   benign   BREAST SURGERY  1968   CARDIOVERSION N/A 03/01/2024    Procedure: CARDIOVERSION;  Surgeon: Isabell Manzanilla, DO;  Location: [facility] ORS;  Service: Cardiovascular;  Laterality: N/A;   CATARACT EXTRACTION  2014/2015   Both eyes   EYE SURGERY     FRACTURE SURGERY  01/2009   Fractured Left ankle, plate on outside of ankle 2 rods through/across ankle from inside of ankle   KNEE ARTHROPLASTY Left 06/04/2023   Procedure: COMPUTER ASSISTED TOTAL KNEE ARTHROPLASTY;  Surgeon: Arlyne Lame, MD;  Location: [facility] ORS;  Service: Orthopedics;  Laterality: Left;   OOPHORECTOMY     TEE WITHOUT CARDIOVERSION N/A 03/01/2024   Procedure: ECHOCARDIOGRAM, TRANSESOPHAGEAL;  Surgeon: Isabell Manzanilla, DO;  Location: [facility] ORS;  Service: Cardiovascular;  Laterality: N/A;   TONSILLECTOMY AND ADENOIDECTOMY  1960    Medications Prior to Admission  Medication Sig Dispense Refill Last Dose/Taking   acetaminophen  (TYLENOL ) 500 MG tablet Take 1,000 mg by mouth as needed for moderate pain.   Taking As Needed   amLODipine -valsartan  (EXFORGE ) 10-160 MG tablet Take 1 tablet by mouth daily. 90 tablet 0 02/28/2024   aspirin  EC 81 MG tablet Take 1 tablet (81 mg total) by mouth in the morning and at bedtime. Swallow whole.   02/28/2024   Ascorbic Acid  (VITAMIN C) 1000 MG tablet Take 1,000 mg by mouth daily. (Patient not taking: Reported on 02/28/2024)   Not Taking   celecoxib  (CELEBREX ) 200 MG capsule Take 1 capsule (200 mg total) by mouth 2 (two) times daily. (Patient not taking: Reported on 10/22/2023) 60 capsule 1 Not Taking   Cholecalciferol  (VITAMIN  D-3) 1000 UNITS CAPS Take 2,000 capsules by mouth daily. (Patient not taking: Reported on 02/28/2024)   Not Taking   diclofenac Sodium (VOLTAREN) 1 % GEL Apply 2 g topically 3 (three) times daily as needed. (Patient not taking: Reported on 10/22/2023)   Not Taking   ondansetron  (ZOFRAN ) 4 MG tablet Take 1 tablet (4 mg total) by mouth every 8 (eight) hours as needed for nausea or vomiting. (Patient not taking: Reported on 02/28/2024) 30 tablet 0  Not Taking   oxyCODONE  (OXY IR/ROXICODONE ) 5 MG immediate release tablet Take 1 tablet (5 mg total) by mouth every 4 (four) hours as needed for moderate pain (pain score 4-6). (Patient not taking: Reported on 10/22/2023) 30 tablet 0 Not Taking   Polyethyl Glycol-Propyl Glycol (SYSTANE OP) Place 1 drop into both eyes daily as needed (dry eyes). (Patient not taking: Reported on 02/28/2024)   Not Taking   Psyllium Husk POWD Take 5.8 g by mouth daily. 1 rounded tsp in water daily (Patient not taking: Reported on 02/28/2024)   Not Taking   senna (SENOKOT) 8.6 MG TABS tablet Take 1 tablet (8.6 mg total) by mouth daily as needed for mild constipation. (Patient not taking: Reported on 02/28/2024) 30 tablet 0 Not Taking   traMADol  (ULTRAM ) 50 MG tablet Take 1-2 tablets (50-100 mg total) by mouth every 4 (four) hours as needed for moderate pain. (Patient not taking: Reported on 02/28/2024) 30 tablet 0 Not Taking   vitamin B-12 (CYANOCOBALAMIN ) 1000 MCG tablet Take 2,000 mcg by mouth daily. (Patient not taking: Reported on 10/22/2023)   Not Taking   Social History   Socioeconomic History   Marital status: Widowed    Spouse name: Not on file   Number of children: Not on file   Years of education: Not on file   Highest education level: Not on file  Occupational History   Not on file  Tobacco Use   Smoking status: Never   Smokeless tobacco: Never  Vaping Use   Vaping status: Never Used  Substance and Sexual Activity   Alcohol  use: No    Alcohol /week: 0.0 standard drinks of alcohol    Drug use: No   Sexual activity: Never    Partners: Male  Other Topics Concern   Not on file  Social History Narrative   Widowed   Retired   Children 3   Pets: none   Caffeine- Coffee, Tea, rare soda   Social Drivers of Corporate investment banker Strain: Low Risk  (12/07/2023)   Received from [facility]   Overall Financial Resource Strain (CARDIA)    Difficulty of Paying Living Expenses: Not hard at  all  Food Insecurity: No Food Insecurity (02/29/2024)   Hunger Vital Sign    Worried About Running Out of Food in the Last Year: Never true    Ran Out of Food in the Last Year: Never true  Transportation Needs: No Transportation Needs (02/29/2024)   PRAPARE - Administrator, Civil Service (Medical): No    Lack of Transportation (Non-Medical): No  Physical Activity: Inactive (03/11/2023)   Exercise Vital Sign    Days of Exercise per Week: 0 days    Minutes of Exercise per Session: 0 min  Stress: No Stress Concern Present (03/11/2023)   [employer] of Occupational Health - Occupational Stress Questionnaire    Feeling of Stress : Not at all  Social Connections: Moderately Integrated (02/29/2024)   Social Connection and Isolation Panel [NHANES]  Frequency of Communication with Friends and Family: More than three times a week    Frequency of Social Gatherings with Friends and Family: Twice a week    Attends Religious Services: More than 4 times per year    Active Member of [employer] or Organizations: Yes    Attends Banker Meetings: More than 4 times per year    Marital Status: Widowed  Intimate Partner Violence: Not At Risk (02/29/2024)   Humiliation, Afraid, Rape, and Kick questionnaire    Fear of Current or Ex-Partner: No    Emotionally Abused: No    Physically Abused: No    Sexually Abused: No    Family History  Problem Relation Age of Onset   Arthritis Mother    Hypertension Mother    Arthritis Father    Hypertension Father    Arthritis Maternal Grandmother    Cancer Brother        Colon   Cancer Maternal Aunt        Breast Cancer   Breast cancer Maternal Aunt    Breast cancer Cousin    Breast cancer Cousin    Hyperlipidemia Son    Hyperlipidemia Son      Vitals:   03/02/24 2337 03/03/24 0319 03/03/24 0503 03/03/24 0828  BP: 122/82 123/78  134/74  Pulse: 69 92  64  Resp: 18 18  20   Temp: 98.7 F (37.1 C) 98.5 F (36.9 C)  98.4 F (36.9 C)   TempSrc:    Oral  SpO2: 94% 95%  96%  Weight:   60.7 kg   Height:        PHYSICAL EXAM General: Well-appearing elderly female, well nourished, in no acute distress. HEENT: Normocephalic and atraumatic. Neck: No JVD.   Lungs: Normal respiratory effort on room air. Clear bilaterally to auscultation. No wheezes, crackles, rhonchi.  Heart: irregularly irregular, controlled HR. Normal S1 and S2 without gallops or murmurs.  Abdomen: Non-distended appearing.  Msk: Normal strength and tone for age. Extremities: Warm and well perfused. No clubbing, cyanosis, edema.  Neuro: Alert and oriented X 3. Psych: Answers questions appropriately.   Labs: Basic Metabolic Panel: Recent Labs    03/02/24 0442 03/03/24 0524  NA 135 137  K 3.9 3.7  CL 102 101  CO2 25 26  GLUCOSE 128* 87  BUN 22 24*  CREATININE 1.06* 0.98  CALCIUM 8.4* 8.4*   Liver Function Tests: No results for input(s): "AST", "ALT", "ALKPHOS", "BILITOT", "PROT", "ALBUMIN" in the last 72 hours. No results for input(s): "LIPASE", "AMYLASE" in the last 72 hours. CBC: Recent Labs    03/02/24 0442 03/03/24 0524  WBC 7.1 6.6  HGB 10.7* 11.6*  HCT 32.8* 35.7*  MCV 86.5 87.3  PLT 238 270   Cardiac Enzymes: No results for input(s): "CKTOTAL", "CKMB", "CKMBINDEX", "TROPONINIHS" in the last 72 hours.  BNP: No results for input(s): "BNP" in the last 72 hours.  D-Dimer: No results for input(s): "DDIMER" in the last 72 hours. Hemoglobin A1C: No results for input(s): "HGBA1C" in the last 72 hours. Fasting Lipid Panel: No results for input(s): "CHOL", "HDL", "LDLCALC", "TRIG", "CHOLHDL", "LDLDIRECT" in the last 72 hours. Thyroid  Function Tests: No results for input(s): "TSH", "T4TOTAL", "T3FREE", "THYROIDAB" in the last 72 hours.  Invalid input(s): "FREET3"  Anemia Panel: No results for input(s): "VITAMINB12", "FOLATE", "FERRITIN", "TIBC", "IRON", "RETICCTPCT" in the last 72 hours.   Radiology: ECHO TEE Result Date:  03/02/2024    TRANSESOPHOGEAL ECHO REPORT   Patient Name:  Jane Willis Date of Exam: 03/01/2024 Medical Rec #:  [id number]         Height:       63.0 in Accession #:    [id number]        Weight:       148.0 lb Date of Birth:  [birth year]         BSA:          1.701 m Patient Age:    82 years          BP:           123/81 mmHg Patient Gender: F                 HR:           116 bpm. Exam Location:  [facility] Procedure: Transesophageal Echo, Cardiac Doppler and Color Doppler (Both            Spectral and Color Flow Doppler were utilized during procedure). Indications:     Not listed on TEE check-in sheet  History:         Patient has no prior history of Echocardiogram examinations.                  Risk Factors:Hypertension and Dyslipidemia.  Sonographer:     Broadus Canes Referring Phys:  [id number] Emiko Osorto Diagnosing Phys: Lanell Pinta Custovic PROCEDURE: The transesophogeal probe was passed without difficulty through the esophogus of the patient. Sedation performed by different physician. The patient's vital signs; including heart rate, blood pressure, and oxygen saturation; remained stable throughout the procedure. The patient developed no complications during the procedure.  IMPRESSIONS  1. Left ventricular ejection fraction, by estimation, is 45 to 50%. The left ventricle has mildly decreased function. The left ventricle has no regional wall motion abnormalities.  2. Right ventricular systolic function is normal. The right ventricular size is normal.  3. A left atrial/left atrial appendage thrombus was detected.  4. The mitral valve was not assessed.  5. The aortic valve was not assessed.  6. The inferior vena cava is normal in size with greater than 50% respiratory variability, suggesting right atrial pressure of 3 mmHg. Conclusion(s)/Recommendation(s): Findings concerning for LA/LAA thrombus. Cardioversion was not performed. Would recommend 4 weeks of anticoagulation prior to further attempts at cardioversion.  FINDINGS  Left Ventricle: Left ventricular ejection fraction, by estimation, is 45 to 50%. The left ventricle has mildly decreased function. The left ventricle has no regional wall motion abnormalities. The left ventricular internal cavity size was normal in size. There is no left ventricular hypertrophy. Right Ventricle: The right ventricular size is normal. No increase in right ventricular wall thickness. Right ventricular systolic function is normal. Left Atrium: Left atrial size was normal in size. A left atrial/left atrial appendage thrombus was detected. Right Atrium: Right atrial size was normal in size. Pericardium: There is no evidence of pericardial effusion. Mitral Valve: The mitral valve was not assessed. Tricuspid Valve: The tricuspid valve is not assessed. Aortic Valve: The aortic valve was not assessed. Pulmonic Valve: The pulmonic valve was not assessed. Aorta: The aortic root is normal in size and structure. Venous: The inferior vena cava is normal in size with greater than 50% respiratory variability, suggesting right atrial pressure of 3 mmHg. IAS/Shunts: No atrial level shunt detected by color flow Doppler. Sabina Custovic Electronically signed by Isabell Manzanilla Signature Date/Time: 03/02/2024/1:50:20 PM    Final    ECHOCARDIOGRAM COMPLETE Result Date: 03/01/2024    ECHOCARDIOGRAM  REPORT   Patient Name:   Jane Willis Date of Exam: 03/01/2024 Medical Rec #:  [id number]         Height:       63.0 in Accession #:    [id number]        Weight:       147.9 lb Date of Birth:  1940-12-24         BSA:          1.701 m Patient Age:    82 years          BP:           126/84 mmHg Patient Gender: F                 HR:           118 bpm. Exam Location:  [facility] Procedure: 2D Echo, Cardiac Doppler and Color Doppler (Both Spectral and Color            Flow Doppler were utilized during procedure). Indications:     Dyspnea R06.00  History:         Patient has prior history of Echocardiogram examinations, most                   recent 03/01/2024. Risk Factors:Hypertension and Dyslipidemia.  Sonographer:     Broadus Canes Referring Phys:  [id number] CARALYN HUDSON Diagnosing Phys: Sabina Custovic IMPRESSIONS  1. Left ventricular ejection fraction, by estimation, is 45 to 50%. The left ventricle has mildly decreased function. The left ventricle has no regional wall motion abnormalities. Left ventricular diastolic parameters are indeterminate.  2. Right ventricular systolic function is normal. The right ventricular size is normal. There is mildly elevated pulmonary artery systolic pressure. The estimated right ventricular systolic pressure is 42.1 mmHg.  3. Left atrial size was mildly dilated.  4. The mitral valve is abnormal. Severe mitral valve regurgitation. No evidence of mitral stenosis.  5. The tricuspid valve is abnormal. Tricuspid valve regurgitation is moderate to severe.  6. The aortic valve is normal in structure. Aortic valve regurgitation is not visualized. No aortic stenosis is present.  7. The inferior vena cava is normal in size with greater than 50% respiratory variability, suggesting right atrial pressure of 3 mmHg. FINDINGS  Left Ventricle: Left ventricular ejection fraction, by estimation, is 45 to 50%. The left ventricle has mildly decreased function. The left ventricle has no regional wall motion abnormalities. The left ventricular internal cavity size was normal in size. There is no left ventricular hypertrophy. Left ventricular diastolic parameters are indeterminate. Right Ventricle: The right ventricular size is normal. No increase in right ventricular wall thickness. Right ventricular systolic function is normal. There is mildly elevated pulmonary artery systolic pressure. The tricuspid regurgitant velocity is 2.92  m/s, and with an assumed right atrial pressure of 8 mmHg, the estimated right ventricular systolic pressure is 42.1 mmHg. Left Atrium: Left atrial size was mildly dilated. Right Atrium: Right  atrial size was normal in size. Pericardium: There is no evidence of pericardial effusion. Mitral Valve: The mitral valve is abnormal. Severe mitral valve regurgitation. No evidence of mitral valve stenosis. Tricuspid Valve: The tricuspid valve is abnormal. Tricuspid valve regurgitation is moderate to severe. Aortic Valve: The aortic valve is normal in structure. Aortic valve regurgitation is not visualized. No aortic stenosis is present. Aortic valve mean gradient measures 2.0 mmHg. Aortic valve peak gradient measures 3.5 mmHg. Aortic valve area, by VTI measures 2.33 cm. Pulmonic Valve:  The pulmonic valve was normal in structure. Pulmonic valve regurgitation is not visualized. Aorta: The aortic root is normal in size and structure. Venous: The inferior vena cava is normal in size with greater than 50% respiratory variability, suggesting right atrial pressure of 3 mmHg. IAS/Shunts: No atrial level shunt detected by color flow Doppler.  LEFT VENTRICLE PLAX 2D LVIDd:         4.30 cm LVIDs:         3.30 cm LV PW:         0.90 cm LV IVS:        1.00 cm LVOT diam:     2.00 cm LV SV:         35 LV SV Index:   21 LVOT Area:     3.14 cm  RIGHT VENTRICLE RV Basal diam:  3.30 cm RV Mid diam:    3.40 cm LEFT ATRIUM             Index        RIGHT ATRIUM           Index LA diam:        4.10 cm 2.41 cm/m   RA Area:     15.90 cm LA Vol (A2C):   45.4 ml 26.69 ml/m  RA Volume:   40.30 ml  23.69 ml/m LA Vol (A4C):   48.1 ml 28.28 ml/m LA Biplane Vol: 48.3 ml 28.39 ml/m  AORTIC VALVE AV Area (Vmax):    2.12 cm AV Area (Vmean):   2.10 cm AV Area (VTI):     2.33 cm AV Vmax:           93.30 cm/s AV Vmean:          63.000 cm/s AV VTI:            0.151 m AV Peak Grad:      3.5 mmHg AV Mean Grad:      2.0 mmHg LVOT Vmax:         63.10 cm/s LVOT Vmean:        42.200 cm/s LVOT VTI:          0.112 m LVOT/AV VTI ratio: 0.74  AORTA Ao Root diam: 2.90 cm MITRAL VALVE                  TRICUSPID VALVE MV Area (PHT): 5.09 cm       TR  Peak grad:   34.1 mmHg MV Decel Time: 149 msec       TR Vmax:        292.00 cm/s MR Peak grad:    89.1 mmHg MR Mean grad:    60.0 mmHg    SHUNTS MR Vmax:         472.00 cm/s  Systemic VTI:  0.11 m MR Vmean:        367.0 cm/s   Systemic Diam: 2.00 cm MR PISA:         1.57 cm MR PISA Eff ROA: 10 mm MR PISA Radius:  0.50 cm MV E velocity: 121.00 cm/s Lanell Pinta Custovic Electronically signed by Isabell Manzanilla Signature Date/Time: 03/01/2024/3:29:29 PM    Final    DG Chest 2 View Result Date: 02/28/2024 CLINICAL DATA:  Shortness of breath for 3 weeks EXAM: CHEST - 2 VIEW COMPARISON:  None Available. FINDINGS: Normal cardiopericardial silhouette. Calcified aorta. Small pleural effusions. No pneumothorax. No consolidation. There is some interstitial changes seen lungs which could be chronic. Please correlate with  any prior. Degenerative changes of the spine. IMPRESSION: Underinflation.  Small pleural effusions. Mild interstitial prominence. These very well could be chronic. Please correlate with any prior. Electronically Signed   By: Adrianna Horde M.D.   On: 02/28/2024 15:25    ECHO as above  TELEMETRY reviewed by me 03/03/2024: atrial fibrillation, rate 70-80s  EKG reviewed by me: Atrial fibrillation, rate 140 bpm  Data reviewed by me 03/03/2024: last 24h vitals tele labs imaging I/O hospitalist progress note.  Principal Problem:   New onset of congestive heart failure (HCC) Active Problems:   Hypertension   Prediabetes   Total knee replacement status   Atrial fibrillation with RVR (HCC)   New onset atrial fibrillation (HCC)   Chest pressure    ASSESSMENT AND PLAN:  Jane Willis is a 83 y.o. female  with a past medical history of hypertension with no known history of heart failure or atrial fibrillation who presented to the ED on 02/28/2024 for shortness of breath, orthopnea, lower extremity swelling and cough. Cardiology was consulted for further evaluation.   # New onset AF RVR # Acute heart  failure  Patient reports to ED with 3 weeks of cough, worsening SOB, orthopnea, and lower extremity edema without chest pain. Patient has no known history of heart failure or AF. EKG in ED with atrial fibrillation rate 140 bpm. Trops negative x2. BNP elevated at 659. Echo this admission EF 45-50%, mildly elevated PASP, no RWMA, severe MR, moderate to severe TR. TEE performed yesterday without DCCV due to found LA appendage thrombus. HR elevated and BP stable this AM. Per tele remains in AF with improved rates 70-80s.  -CHADS2VASC of 4. Anticoagulation is recommended for stroke risk reduction.  -Continue Eliquis 5 mg twice daily for stroke risk reduction. -Discontinue the aspirin .  -Continue metoprolol tartrate 25 mg four times daily for improved HR control. -Continue IV digoxin load today for better rate control. Check digoxin level at outpatient follow-up -Continue PO lasix 20 mg daily. Closely monitor renal function and UOP.  -Start empagliflozin 10 mg for GDMT optimization.  -Goal K >4, Mag >2. -Due to LA appendage thrombus found on TEE. Continue anticoagulation with Eliquis. Consider to re-evaluate with TEE/DCCV in about 6-8 weeks outpatient-. -Consider outpatient referral to EP.   This patient's plan of care was discussed and created with Dr. Custovic and she is in agreement.  Cardiology will sign off. Please haiku with questions or re-engage if needed.    Signed: Creighton Doffing, PA-C  03/03/2024, 10:50 AM [facility] Cardiology

## 2024-03-03 NOTE — Discharge Instructions (Signed)
 Information on my medicine - ELIQUIS (apixaban)  This medication education was reviewed with me or my healthcare representative as part of my discharge preparation.  The pharmacist that spoke with me during my hospital stay was:  Margean Sheehan, RPH-CPP  Why was Eliquis prescribed for you? Eliquis was prescribed for you to reduce the risk of forming blood clots that can cause a stroke if you have a medical condition called atrial fibrillation (a type of irregular heartbeat)   What do You need to know about Eliquis ? Take your Eliquis TWICE DAILY - one tablet in the morning and one tablet in the evening with or without food.  It would be best to take the doses about the same time each day.  If you have difficulty swallowing the tablet whole please discuss with your pharmacist how to take the medication safely.  Take Eliquis exactly as prescribed by your doctor and DO NOT stop taking Eliquis without talking to the doctor who prescribed the medication.  Stopping may increase your risk of developing a new clot or stroke.  Refill your prescription before you run out.  After discharge, you should have regular check-up appointments with your healthcare provider that is prescribing your Eliquis.  In the future your dose may need to be changed if your kidney function or weight changes by a significant amount or as you get older.  What do you do if you miss a dose? If you miss a dose, take it as soon as you remember on the same day and resume taking twice daily.  Do not take more than one dose of ELIQUIS at the same time.  Important Safety Information A possible side effect of Eliquis is bleeding. You should call your healthcare provider right away if you experience any of the following: Bleeding from an injury or your nose that does not stop. Unusual colored urine (red or dark brown) or unusual colored stools (red or black). Unusual bruising for unknown reasons. A serious fall or if you hit  your head (even if there is no bleeding).  Some medicines may interact with Eliquis and might increase your risk of bleeding or clotting while on Eliquis. To help avoid this, consult your healthcare provider or pharmacist prior to using any new prescription or non-prescription medications, including herbals, vitamins, non-steroidal anti-inflammatory drugs (NSAIDs) and supplements.  This website has more information on Eliquis (apixaban): http://www.eliquis.com/eliquis/home

## 2024-03-03 NOTE — Telephone Encounter (Signed)
 Pharmacy Patient Advocate Encounter  Insurance verification completed.    The patient is insured through [employer]. Patient has Medicare and is not eligible for a copay card, but may be able to apply for patient assistance or Medicare RX Payment Plan (Patient Must reach out to their plan, if eligible for payment plan), if available.    Ran test claim for Jane Willis and the current 30 day co-pay is $516.67.  Ran test claim for Jardiance and the current 30 day co-pay is $523.90.  Ran test claim for Entresto and the current 30 day co-pay is $542.31.  This test claim was processed through [facility]- copay amounts may vary at other pharmacies due to pharmacy/plan contracts, or as the patient moves through the different stages of their insurance plan.

## 2024-03-06 ENCOUNTER — Telehealth: Payer: Self-pay

## 2024-03-06 NOTE — Patient Instructions (Signed)
 Visit Information  Thank you for taking time to visit with me today. Please don't hesitate to contact me if I can be of assistance to you before our next scheduled telephone appointment.  Our next appointment is by telephone on Wednesday May 28th at 10am  Following is a copy of your care plan:   Goals Addressed             This Visit's Progress    VBCI Transitions of Care (TOC) Care Plan       Problems:  Recent Hospitalization for treatment of CHF  Goal:  Over the next 30 days, the patient will not experience hospital readmission  Interventions:   Heart Failure Interventions: Provided education on low sodium diet Assessed need for readable accurate scales in home Provided education about placing scale on hard, flat surface Advised patient to weigh each morning after emptying bladder Discussed importance of daily weight and advised patient to weigh and record daily Reviewed role of diuretics in prevention of fluid overload and management of heart failure; Discussed the importance of keeping all appointments with provider Assessed social determinant of health barriers   Patient Self Care Activities:  Attend all scheduled provider appointments Call pharmacy for medication refills 3-7 days in advance of running out of medications Call provider office for new concerns or questions  Notify RN Care Manager of TOC call rescheduling needs Participate in Transition of Care Program/Attend TOC scheduled calls Perform all self care activities independently  Take medications as prescribed   call office if I gain more than 2 pounds in one day or 5 pounds in one week keep legs up while sitting track weight in diary use salt in moderation watch for swelling in feet, ankles and legs every day  Plan:  Telephone follow up appointment with care management team member scheduled for:  Wednesday May 28th 10am        Patient verbalizes understanding of instructions and care plan provided  today and agrees to view in MyChart. Active MyChart status and patient understanding of how to access instructions and care plan via MyChart confirmed with patient.     The patient has been provided with contact information for the care management team and has been advised to call with any health related questions or concerns.   Please call the care guide team at [phone] if you need to cancel or reschedule your appointment.   Please call the Suicide and Crisis Lifeline: 988 call the [place]  National Suicide Prevention Lifeline: 458-435-9866 or TTY: (479)874-1243 TTY (573) 857-1281) to talk to a trained counselor if you are experiencing a Mental Health or Behavioral Health Crisis or need someone to talk to.  Gareld June, BSN, RN [place]  VBCI - [employer] [facility] RN Care Manager [phone]

## 2024-03-06 NOTE — Transitions of Care (Post Inpatient/ED Visit) (Signed)
 03/06/2024  Name: Jane Willis MRN: [id number] DOB: [birth year]  Today's TOC FU Call Status: Today's TOC FU Call Status:: Successful TOC FU Call Completed TOC FU Call Complete Date: 03/06/24 Patient's Name and Date of Birth confirmed.  Transition Care Management Follow-up Telephone Call Date of Discharge: 03/03/24 Discharge Facility: [facility] [place]) Type of Discharge: Inpatient Admission Primary Inpatient Discharge Diagnosis:: CHF How have you been since you were released from the hospital?: Better Any questions or concerns?: No  Items Reviewed: Did you receive and understand the discharge instructions provided?: Yes Medications obtained,verified, and reconciled?: Yes (Medications Reviewed) Any new allergies since your discharge?: No Dietary orders reviewed?: Yes Type of Diet Ordered:: Low Sodium Heart Healthy Do you have support at home?: No  Medications Reviewed Today: Medications Reviewed Today     Reviewed by Claudene Crystal, RN (Case Manager) on 03/06/24 at 1019  Med List Status: <None>   Medication Order Taking? Sig Documenting Provider Last Dose Status Informant  acetaminophen  (TYLENOL ) 500 MG tablet [id number]  Take 1,000 mg by mouth as needed for moderate pain. [provider]  Active Self, Pharmacy Records           Med Note Lexine Redder, MARIA   Mon Feb 28, 2024  8:30 PM) prn  apixaban  (ELIQUIS ) 5 MG TABS tablet [id number]  Take 1 tablet (5 mg total) by mouth 2 (two) times daily. Alphonsus Jeans, MD  Active   Cholecalciferol  (VITAMIN D -3) 1000 UNITS CAPS [id number]  Take 2,000 capsules by mouth daily.  Patient not taking: Reported on 02/28/2024   [provider]  Active Self, Pharmacy Records  empagliflozin  (JARDIANCE ) 10 MG TABS tablet [id number]  Take 1 tablet (10 mg total) by mouth daily. Alphonsus Jeans, MD  Active   furosemide  (LASIX ) 20 MG tablet [id number]  Take 1 tablet (20 mg total) by mouth daily. Alphonsus Jeans, MD  Active   metoprolol  tartrate (LOPRESSOR ) 25 MG tablet [id number]  Take 1 tablet (25 mg total) by mouth 4 (four) times daily. Alphonsus Jeans, MD  Active   senna (SENOKOT) 8.6 MG TABS tablet [id number] Yes Take 1 tablet (8.6 mg total) by mouth daily as needed for mild constipation. Noble Bateman, PA Unknown Active Self, Pharmacy Records            Home Care and Equipment/Supplies: Were Home Health Services Ordered?: NA Any new equipment or medical supplies ordered?: NA  Functional Questionnaire: Do you need assistance with bathing/showering or dressing?: No Do you need assistance with meal preparation?: No Do you need assistance with eating?: No Do you have difficulty maintaining continence: No Do you need assistance with getting out of bed/getting out of a chair/moving?: No Do you have difficulty managing or taking your medications?: No  Follow up appointments reviewed: PCP Follow-up appointment confirmed?: No (The patient wants to see her Cardiologist first) MD Provider Line Number:[phone] Given: No Specialist Hospital Follow-up appointment confirmed?: Yes Date of Specialist follow-up appointment?: 03/10/24 Follow-Up Specialty Provider:: Dr. Braxton Calico Do you need transportation to your follow-up appointment?: No Do you understand care options if your condition(s) worsen?: Yes-patient verbalized understanding  SDOH Interventions Today    Flowsheet Row Most Recent Value  SDOH Interventions   Food Insecurity Interventions Intervention Not Indicated  Housing Interventions Intervention Not Indicated  Transportation Interventions Intervention Not Indicated  Utilities Interventions Intervention Not Indicated       Goals Addressed  This Visit's Progress    VBCI Transitions of Care (TOC) Care Plan       Problems:  Recent Hospitalization for treatment of CHF  Goal:  Over the next 30 days, the patient will not experience hospital  readmission  Interventions:   Heart Failure Interventions: Provided education on low sodium diet Assessed need for readable accurate scales in home Provided education about placing scale on hard, flat surface Advised patient to weigh each morning after emptying bladder Discussed importance of daily weight and advised patient to weigh and record daily Reviewed role of diuretics in prevention of fluid overload and management of heart failure; Discussed the importance of keeping all appointments with provider Assessed social determinant of health barriers   Patient Self Care Activities:  Attend all scheduled provider appointments Call pharmacy for medication refills 3-7 days in advance of running out of medications Call provider office for new concerns or questions  Notify RN Care Manager of TOC call rescheduling needs Participate in Transition of Care Program/Attend TOC scheduled calls Perform all self care activities independently  Take medications as prescribed   call office if I gain more than 2 pounds in one day or 5 pounds in one week keep legs up while sitting track weight in diary use salt in moderation watch for swelling in feet, ankles and legs every day  Plan:  Telephone follow up appointment with care management team member scheduled for:  Wednesday May 28th 10am        The patient was hospitalized for new CHF. She does not want to move up her PCP appointment with Dr. Casimir Cleaver that is scheduled in July at this time. She was a patient of Dr. Shirly Dow who left the practice and Dr. Casimir Cleaver will be her new PCP. The patient wants to follow up with the cardiologist at [facility] which is scheduled for 03/10/24 and then make a decision about seeing the PCP. She is independent, lives alone and states she is an active person.  Gareld June, BSN, RN   VBCI - [employer] [facility] RN Care Manager [phone]

## 2024-03-15 ENCOUNTER — Other Ambulatory Visit: Payer: Self-pay

## 2024-03-15 NOTE — Transitions of Care (Post Inpatient/ED Visit) (Signed)
 Transition of Care week 2  Visit Note  03/15/2024  Name: Jane Willis MRN: [id number]          DOB: February 12, 1941  Situation: Patient enrolled in [facility] 30-day program. Visit completed with Ronell Coe by telephone.   Background:     Past Medical History:  Diagnosis Date   Allergy    Seasonal   Arthritis    Chicken pox    Hyperlipidemia    Hypertension    PONV (postoperative nausea and vomiting)    Urinary incontinence     Assessment: Patient Reported Symptoms: Cognitive Cognitive Status: Alert and oriented to person, place, and time      Neurological Neurological Review of Symptoms: No symptoms reported    HEENT HEENT Symptoms Reported: No symptoms reported      Cardiovascular Cardiovascular Symptoms Reported: No symptoms reported Does patient have uncontrolled Hypertension?: No Cardiovascular Conditions: Heart failure, Hypertension Cardiovascular Management Strategies: Medication therapy, Routine screening, Weight management Do You Have a Working Readable Scale?: Yes Weight: 131 lb (59.4 kg) Cardiovascular Self-Management Outcome: 3 (uncertain) Cardiovascular Comment: Cardioversion scheduled for 6/16  Respiratory Respiratory Symptoms Reported: No symptoms reported    Endocrine Patient reports the following symptoms related to hypoglycemia or hyperglycemia : No symptoms reported Is patient diabetic?: No    Gastrointestinal Gastrointestinal Symptoms Reported: No symptoms reported      Genitourinary Genitourinary Symptoms Reported: No symptoms reported    Integumentary Integumentary Symptoms Reported: No symptoms reported    Musculoskeletal Musculoskelatal Symptoms Reviewed: No symptoms reported   Falls in the past year?: Yes Number of falls in past year: 1 or less Was there an injury with Fall?: No Fall Risk Category Calculator: 1 Patient Fall Risk Level: Low Fall Risk Patient at Risk for Falls Due to: History of fall(s) Fall risk Follow up: Falls  evaluation completed  Psychosocial Psychosocial Symptoms Reported: No symptoms reported         Vitals:   03/15/24 1036  BP: 130/60    Medications Reviewed Today     Reviewed by Claudene Crystal, RN (Case Manager) on 03/15/24 at 1023  Med List Status: <None>   Medication Order Taking? Sig Documenting Provider Last Dose Status Informant  acetaminophen  (TYLENOL ) 500 MG tablet [id number]  Take 1,000 mg by mouth as needed for moderate pain. [provider]  Active Self, Pharmacy Records           Med Note Lexine Redder, MARIA   Mon Feb 28, 2024  8:30 PM) prn  amLODipine -valsartan  (EXFORGE ) 5-160 MG tablet [id number] Yes Take 1 tablet by mouth daily. [provider]  Active   apixaban  (ELIQUIS ) 5 MG TABS tablet [id number]  Take 1 tablet (5 mg total) by mouth 2 (two) times daily. Alphonsus Jeans, MD  Active   Cholecalciferol  (VITAMIN D -3) 1000 UNITS CAPS [id number] Yes Take 1,000 capsules by mouth daily. [provider] Taking Active Self, Pharmacy Records  cyanocobalamin  (VITAMIN B12) 1000 MCG tablet [id number] Yes Take 1,000 mcg by mouth daily. [provider]  Active   empagliflozin  (JARDIANCE ) 10 MG TABS tablet [id number]  Take 1 tablet (10 mg total) by mouth daily. Alphonsus Jeans, MD  Active   furosemide  (LASIX ) 20 MG tablet [id number]  Take 1 tablet (20 mg total) by mouth daily. Alphonsus Jeans, MD  Active   metoprolol  tartrate (LOPRESSOR ) 25 MG tablet [id number]  Take 1 tablet (25 mg total) by mouth 4 (four) times daily. Alphonsus Jeans, MD  Active   senna [facility])  8.6 MG TABS tablet [id number]  Take 1 tablet (8.6 mg total) by mouth daily as needed for mild constipation. Noble Bateman, PA  Active Self, Pharmacy Records            Recommendation:   Continue with daily weights to monitor for fluid gain Continue with daily BP's  Increase activity as tolerated  Notify the provider if there is an increase in palpatations  Follow Up  Plan:   Telephone follow-up in 1 week  Gareld June, BSN, RN [place]  VBCI - [facility] RN Care Manager [phone]

## 2024-03-15 NOTE — Patient Instructions (Signed)
 Visit Information  Thank you for taking time to visit with me today. Please don't hesitate to contact me if I can be of assistance to you before our next scheduled telephone appointment.  Our next appointment is by telephone on Wednesday June 4th at 9:30am  Following is a copy of your care plan:   Goals Addressed             This Visit's Progress    VBCI Transitions of Care (TOC) Care Plan       Problems: (reviewed 03/15/24) Recent Hospitalization for treatment of CHF  Goal: (reviewed 03/15/24) Over the next 30 days, the patient will not experience hospital readmission  Interventions: (reviewed 03/15/24)  Heart Failure Interventions: Provided education on low sodium diet Assessed need for readable accurate scales in home Provided education about placing scale on hard, flat surface Advised patient to weigh each morning after emptying bladder Discussed importance of daily weight and advised patient to weigh and record daily Reviewed role of diuretics in prevention of fluid overload and management of heart failure; Discussed the importance of keeping all appointments with provider Assessed social determinant of health barriers  Cardioversion scheduled for 04/03/24  Patient Self Care Activities: (reviewed 03/15/24) Attend all scheduled provider appointments Call pharmacy for medication refills 3-7 days in advance of running out of medications Call provider office for new concerns or questions  Notify RN Care Manager of [facility] call rescheduling needs Participate in Transition of Care Program/Attend TOC scheduled calls Perform all self care activities independently  Take medications as prescribed   call office if I gain more than 2 pounds in one day or 5 pounds in one week keep legs up while sitting track weight in diary use salt in moderation watch for swelling in feet, ankles and legs every day PCP scheduled for 04/20/24  Plan:  Telephone follow up appointment with care management  team member scheduled for:  Wednesday June 4 at 9:30am        Patient verbalizes understanding of instructions and care plan provided today and agrees to view in MyChart. Active MyChart status and patient understanding of how to access instructions and care plan via MyChart confirmed with patient.     The patient has been provided with contact information for the care management team and has been advised to call with any health related questions or concerns.   Please call the care guide team at [phone] if you need to cancel or reschedule your appointment.   Please call the Suicide and Crisis Lifeline: 988 call the [place]  National Suicide Prevention Lifeline: 361-442-7109 or TTY: (862)462-7460 TTY (289)406-9448) to talk to a trained counselor if you are experiencing a Mental Health or Behavioral Health Crisis or need someone to talk to.  Gareld June, BSN, RN [place]  VBCI - [employer] [facility] RN Care Manager [phone]

## 2024-03-22 ENCOUNTER — Other Ambulatory Visit: Payer: Self-pay

## 2024-03-22 NOTE — Patient Instructions (Signed)
 Visit Information  Thank you for taking time to visit with me today. Please don't hesitate to contact me if I can be of assistance to you before our next scheduled telephone appointment.  Following is a copy of your care plan:   Goals Addressed             This Visit's Progress    COMPLETED: VBCI Transitions of Care (TOC) Care Plan       Problems: (reviewed 03/22/24) Recent Hospitalization for treatment of CHF  Goal: (reviewed 03/22/24) Over the next 30 days, the patient will not experience hospital readmission  Interventions: (reviewed 03/22/24)  Heart Failure Interventions: Provided education on low sodium diet Assessed need for readable accurate scales in home Provided education about placing scale on hard, flat surface Advised patient to weigh each morning after emptying bladder Discussed importance of daily weight and advised patient to weigh and record daily Reviewed role of diuretics in prevention of fluid overload and management of heart failure; Discussed the importance of keeping all appointments with provider Assessed social determinant of health barriers  Cardioversion scheduled for 04/03/24  Patient Self Care Activities: (reviewed 03/22/24) Attend all scheduled provider appointments Call pharmacy for medication refills 3-7 days in advance of running out of medications Call provider office for new concerns or questions  Notify RN Care Manager of [facility] call rescheduling needs Participate in Transition of Care Program/Attend TOC scheduled calls Perform all self care activities independently  Take medications as prescribed   call office if I gain more than 2 pounds in one day or 5 pounds in one week keep legs up while sitting track weight in diary use salt in moderation watch for swelling in feet, ankles and legs every day PCP scheduled for 04/20/24  Plan:  Telephone follow up appointment with care management team member scheduled for:  Wednesday June 4 at 9:30am         Patient verbalizes understanding of instructions and care plan provided today and agrees to view in MyChart. Active MyChart status and patient understanding of how to access instructions and care plan via MyChart confirmed with patient.     The patient has been provided with contact information for the care management team and has been advised to call with any health related questions or concerns.   Please call the care guide team at [phone] if you need to cancel or reschedule your appointment.   Please call the Suicide and Crisis Lifeline: 988 call the [place]  National Suicide Prevention Lifeline: 540-110-0905 or TTY: (651) 595-6677 TTY (905)411-9059) to talk to a trained counselor if you are experiencing a Mental Health or Behavioral Health Crisis or need someone to talk to.  Gareld June, BSN, RN [place]  VBCI - [employer] [facility] RN Care Manager [phone]

## 2024-03-22 NOTE — Transitions of Care (Post Inpatient/ED Visit) (Signed)
 Transition of Care week 3  Visit Note  03/22/2024  Name: Jane Willis MRN: [id number]          DOB: 1941/07/12  Situation: Patient enrolled in [facility] 30-day program. Visit completed with Ronell Coe by telephone.   Background:    Past Medical History:  Diagnosis Date   Allergy    Seasonal   Arthritis    Chicken pox    Hyperlipidemia    Hypertension    PONV (postoperative nausea and vomiting)    Urinary incontinence     Assessment: Patient Reported Symptoms: Cognitive Cognitive Status: Alert and oriented to person, place, and time      Neurological Neurological Review of Symptoms: No symptoms reported    HEENT HEENT Symptoms Reported: No symptoms reported      Cardiovascular Cardiovascular Symptoms Reported: No symptoms reported Does patient have uncontrolled Hypertension?: No Cardiovascular Conditions: Heart failure, High blood cholesterol Cardiovascular Management Strategies: Medication therapy, Fluid modification, Weight management, Routine screening Do You Have a Working Readable Scale?: Yes Weight: 135 lb (61.2 kg) (States she flucuates between 135 and 138lbs at home) Cardiovascular Self-Management Outcome: 4 (good) Cardiovascular Comment: The patient is adherent to the care plan and insturctions by the provider  Respiratory Respiratory Symptoms Reported: No symptoms reported    Endocrine Patient reports the following symptoms related to hypoglycemia or hyperglycemia : No symptoms reported Is patient diabetic?: No Endocrine Conditions: Other Other Endocrine Conditions: Prediabetes Endocrine Management Strategies: Medication therapy Endocrine Self-Management Outcome: 4 (good)  Gastrointestinal Gastrointestinal Symptoms Reported: No symptoms reported Gastrointestinal Conditions: Constipation Gastrointestinal Management Strategies: Medication therapy Gastrointestinal Self-Management Outcome: 4 (good) Nutrition Risk Screen (CP): No indicators present   Genitourinary Genitourinary Symptoms Reported: No symptoms reported    Integumentary Integumentary Symptoms Reported: No symptoms reported Skin Self-Management Outcome: 4 (good)  Musculoskeletal Musculoskelatal Symptoms Reviewed: No symptoms reported Musculoskeletal Self-Management Outcome: 4 (good) Falls in the past year?: Yes Number of falls in past year: 1 or less Was there an injury with Fall?: No Fall Risk Category Calculator: 1 Patient Fall Risk Level: Low Fall Risk Patient at Risk for Falls Due to: History of fall(s) Fall risk Follow up: Falls evaluation completed  Psychosocial Psychosocial Symptoms Reported: No symptoms reported         There were no vitals filed for this visit.  Medications Reviewed Today     Reviewed by Claudene Crystal, RN (Case Manager) on 03/22/24 at [phone]  Med List Status: <None>   Medication Order Taking? Sig Documenting Provider Last Dose Status Informant  acetaminophen  (TYLENOL ) 500 MG tablet [id number] No Take 1,000 mg by mouth as needed for moderate pain. [provider] Unknown Active Self, Pharmacy Records           Med Note Lexine Redder, MARIA   Mon Feb 28, 2024  8:30 PM) prn  amLODipine -valsartan  (EXFORGE ) 5-160 MG tablet [id number] Yes Take 1 tablet by mouth daily. [provider] Taking Active   apixaban  (ELIQUIS ) 5 MG TABS tablet [id number] Yes Take 1 tablet (5 mg total) by mouth 2 (two) times daily. Alphonsus Jeans, MD Taking Active   Cholecalciferol  (VITAMIN D -3) 1000 UNITS CAPS [id number] Yes Take 1,000 capsules by mouth daily. [provider] Taking Active Self, Pharmacy Records  cyanocobalamin  (VITAMIN B12) 1000 MCG tablet [id number] Yes Take 1,000 mcg by mouth daily. [provider] Taking Active   empagliflozin  (JARDIANCE ) 10 MG TABS tablet [id number] Yes Take 1 tablet (10 mg total) by mouth daily. Alphonsus Jeans, MD Taking Active  furosemide  (LASIX ) 20 MG tablet [id number] Yes Take 1 tablet (20  mg total) by mouth daily. Alphonsus Jeans, MD Taking Active   metoprolol  tartrate (LOPRESSOR ) 25 MG tablet [id number] Yes Take 1 tablet (25 mg total) by mouth 4 (four) times daily. Alphonsus Jeans, MD Taking Active   senna (SENOKOT) 8.6 MG TABS tablet [id number] No Take 1 tablet (8.6 mg total) by mouth daily as needed for mild constipation. Noble Bateman, PA Unknown Active Self, Pharmacy Records            Recommendation:   The patient feels that she has met her goals and wants to disenroll  Follow Up Plan:   Closing From:  Transitions of Care Program  [facility], BSN, RN [place]  VBCI - [facility] RN Care Manager [phone]

## 2024-04-03 ENCOUNTER — Ambulatory Visit: Admitting: Certified Registered"

## 2024-04-03 ENCOUNTER — Ambulatory Visit
Admission: RE | Admit: 2024-04-03 | Discharge: 2024-04-03 | Disposition: A | Discharge status: Home / Self Care | Source: Ambulatory Visit | Admitting: Cardiovascular Disease

## 2024-04-03 ENCOUNTER — Ambulatory Visit
Admission: RE | Admit: 2024-04-03 | Discharge: 2024-04-03 | Disposition: A | Discharge status: Home / Self Care | Source: Ambulatory Visit | Attending: Cardiovascular Disease | Admitting: Cardiovascular Disease

## 2024-04-03 ENCOUNTER — Encounter
Admission: RE | Disposition: A | Discharge status: Home / Self Care | Payer: Self-pay | Source: Ambulatory Visit | Attending: Cardiovascular Disease

## 2024-04-03 ENCOUNTER — Encounter: Payer: Self-pay | Admitting: Cardiovascular Disease

## 2024-04-03 ENCOUNTER — Other Ambulatory Visit: Payer: Self-pay

## 2024-04-03 DIAGNOSIS — I10 Essential (primary) hypertension: Secondary | ICD-10-CM | POA: Diagnosis not present

## 2024-04-03 DIAGNOSIS — I081 Rheumatic disorders of both mitral and tricuspid valves: Secondary | ICD-10-CM | POA: Insufficient documentation

## 2024-04-03 DIAGNOSIS — Z7901 Long term (current) use of anticoagulants: Secondary | ICD-10-CM | POA: Insufficient documentation

## 2024-04-03 DIAGNOSIS — I4891 Unspecified atrial fibrillation: Secondary | ICD-10-CM | POA: Diagnosis present

## 2024-04-03 DIAGNOSIS — I4819 Other persistent atrial fibrillation: Secondary | ICD-10-CM | POA: Insufficient documentation

## 2024-04-03 DIAGNOSIS — K219 Gastro-esophageal reflux disease without esophagitis: Secondary | ICD-10-CM | POA: Diagnosis not present

## 2024-04-03 HISTORY — PX: TEE WITHOUT CARDIOVERSION: SHX5443

## 2024-04-03 HISTORY — PX: CARDIOVERSION: SHX1299

## 2024-04-03 LAB — ECHO TEE

## 2024-04-03 SURGERY — ECHOCARDIOGRAM, TRANSESOPHAGEAL
Anesthesia: General

## 2024-04-03 MED ORDER — SODIUM CHLORIDE 0.9 % IV SOLN: INTRAVENOUS | Status: DC

## 2024-04-03 MED ORDER — LIDOCAINE VISCOUS HCL 2 % MT SOLN
OROMUCOSAL | Status: AC
  Filled 2024-04-03: qty 15

## 2024-04-03 MED ORDER — PROPOFOL 10 MG/ML IV BOLUS
INTRAVENOUS | Status: DC | PRN
  Administered 2024-04-03 (×2): 30 mg via INTRAVENOUS
  Administered 2024-04-03: 40 mg via INTRAVENOUS

## 2024-04-03 MED ORDER — BUTAMBEN-TETRACAINE-BENZOCAINE 2-2-14 % EX AERO
INHALATION_SPRAY | CUTANEOUS | Status: AC
  Filled 2024-04-03: qty 5

## 2024-04-03 NOTE — Anesthesia Preprocedure Evaluation (Signed)
 Anesthesia Evaluation  Patient identified by MRN, date of birth, ID band Patient awake    Reviewed: Allergy & Precautions, NPO status , Patient's Chart, lab work & pertinent test results  History of Anesthesia Complications (+) PONV and history of anesthetic complications  Airway Mallampati: III  TM Distance: <3 FB Neck ROM: full    Dental  (+) Chipped, Dental Advidsory Given   Pulmonary shortness of breath (seconadary to afib), neg sleep apnea, neg COPD, neg recent URI   Pulmonary exam normal        Cardiovascular hypertension, + angina (secondary to afib)  +CHF  (-) CAD, (-) Past MI and (-) Cardiac Stents + dysrhythmias Atrial Fibrillation (-) Valvular Problems/Murmurs     Neuro/Psych  Headaches, neg Seizures  negative psych ROS   GI/Hepatic Neg liver ROS,GERD  Controlled,,  Endo/Other  negative endocrine ROS    Renal/GU negative Renal ROS  negative genitourinary   Musculoskeletal   Abdominal   Peds  Hematology negative hematology ROS (+)   Anesthesia Other Findings Past Medical History: No date: Allergy     Comment:  Seasonal No date: Arthritis No date: Chicken pox No date: Hyperlipidemia No date: Hypertension No date: PONV (postoperative nausea and vomiting) No date: Urinary incontinence  Past Surgical History: 1986: ABDOMINAL HYSTERECTOMY 12/10/2021: ANTERIOR LATERAL LUMBAR FUSION WITH PERCUTANEOUS SCREW 1  LEVEL; N/A     Comment:  Procedure: L4-5 LATERAL INTERBODY FUSION WITH POSTERIOR               PERCUTANEOUS FIXATION;  Surgeon: Jodeen Munch, MD;               Location: [facility] OR[facility];  Service: Neurosurgery;  Laterality:               N/A; 12/10/2021: APPLICATION OF INTRAOPERATIVE CT SCAN; N/A     Comment:  Procedure: APPLICATION OF INTRAOPERATIVE CT SCAN;                Surgeon: Jodeen Munch, MD;  Location: [facility] OR[facility];                Service: Neurosurgery;  Laterality: N/A; 1970's: BREAST  EXCISIONAL BIOPSY; Left     Comment:  benign 1968: BREAST SURGERY 2014/2015: CATARACT EXTRACTION     Comment:  Both eyes No date: EYE SURGERY 01/2009: FRACTURE SURGERY     Comment:  Fractured Left ankle, plate on outside of ankle 2 rods               through/across ankle from inside of ankle 06/04/2023: KNEE ARTHROPLASTY; Left     Comment:  Procedure: COMPUTER ASSISTED TOTAL KNEE ARTHROPLASTY;                Surgeon: Arlyne Lame, MD;  Location: [facility];                Service: Orthopedics;  Laterality: Left; No date: OOPHORECTOMY 1960: TONSILLECTOMY AND ADENOIDECTOMY  BMI    Body Mass Index: 26.20 kg/m      Reproductive/Obstetrics negative OB ROS                             Anesthesia Physical Anesthesia Plan  ASA: 4  Anesthesia Plan: General   Post-op Pain Management:    Induction: Intravenous  PONV Risk Score and Plan: Propofol  infusion, TIVA and Treatment may vary due to age or medical condition  Airway Management Planned: Natural Airway and Nasal Cannula  Additional Equipment:   Intra-op Plan:   Post-operative Plan:   Informed Consent: I have reviewed the patients History and Physical, chart, labs and discussed the procedure including the risks, benefits and alternatives for the proposed anesthesia with the patient or authorized representative who has indicated his/her understanding and acceptance.     Dental Advisory Given  Plan Discussed with: Anesthesiologist, CRNA and Surgeon  Anesthesia Plan Comments: (Patient consented for risks of anesthesia including but not limited to:  - adverse reactions to medications - risk of airway placement if required - damage to eyes, teeth, lips or other oral mucosa - nerve damage due to positioning  - sore throat or hoarseness - Damage to heart, brain, nerves, lungs, other parts of body or loss of life  Patient voiced understanding and assent.)       Anesthesia Quick Evaluation

## 2024-04-03 NOTE — Anesthesia Postprocedure Evaluation (Signed)
 Anesthesia Post Note  Patient: Jane Willis  Procedure(s) Performed: ECHOCARDIOGRAM, TRANSESOPHAGEAL CARDIOVERSION  Patient location during evaluation: Specials Recovery Anesthesia Type: General Level of consciousness: awake and alert Pain management: pain level controlled Vital Signs Assessment: post-procedure vital signs reviewed and stable Respiratory status: spontaneous breathing, nonlabored ventilation, respiratory function stable and patient connected to nasal cannula oxygen  Cardiovascular status: blood pressure returned to baseline and stable Postop Assessment: no apparent nausea or vomiting Anesthetic complications: no   No notable events documented.   Last Vitals:  Vitals:   04/03/24 1345 04/03/24 1400  BP: 122/75 (!) 138/104  Pulse: 94 (!) 119  Resp: 20 (!) 21  Temp:    SpO2: 98% 97%    Last Pain:  Vitals:   04/03/24 1340  TempSrc: Axillary  PainSc:                  [name]

## 2024-04-03 NOTE — H&P (Signed)
 Pre-procedure History & Physical    Patient ID: Jane Willis MRN: [id number]; DOB: 1941-03-17   Date of procedure: 04/03/2024  Primary Care Provider: Patient, No Pcp Per Primary Cardiologist: None   Planned procedure:  TEE / DCCV  HPI:   Jane Willis is a 83 y.o. female with AF, h/o LAA thrombus, here for repeat TEE and DCCV attempt  Past Medical History:  Diagnosis Date   Allergy    Seasonal   Arthritis    Chicken pox    Hyperlipidemia    Hypertension    PONV (postoperative nausea and vomiting)    Urinary incontinence     Past Surgical History:  Procedure Laterality Date   ABDOMINAL HYSTERECTOMY  1986   ANTERIOR LATERAL LUMBAR FUSION WITH PERCUTANEOUS SCREW 1 LEVEL N/A 12/10/2021   Procedure: L4-5 LATERAL INTERBODY FUSION WITH POSTERIOR PERCUTANEOUS FIXATION;  Surgeon: Jodeen Munch, MD;  Location: [facility];  Service: Neurosurgery;  Laterality: N/A;   APPLICATION OF INTRAOPERATIVE CT SCAN N/A 12/10/2021   Procedure: APPLICATION OF INTRAOPERATIVE CT SCAN;  Surgeon: Jodeen Munch, MD;  Location: [facility];  Service: Neurosurgery;  Laterality: N/A;   BREAST EXCISIONAL BIOPSY Left 1970's   benign   BREAST SURGERY  1968   CARDIOVERSION N/A 03/01/2024   Procedure: CARDIOVERSION;  Surgeon: Isabell Manzanilla, DO;  Location: [facility];  Service: Cardiovascular;  Laterality: N/A;   CATARACT EXTRACTION  2014/2015   Both eyes   EYE SURGERY     FRACTURE SURGERY  01/2009   Fractured Left ankle, plate on outside of ankle 2 rods through/across ankle from inside of ankle   KNEE ARTHROPLASTY Left 06/04/2023   Procedure: COMPUTER ASSISTED TOTAL KNEE ARTHROPLASTY;  Surgeon: Arlyne Lame, MD;  Location: [facility];  Service: Orthopedics;  Laterality: Left;   OOPHORECTOMY     TEE WITHOUT CARDIOVERSION N/A 03/01/2024   Procedure: ECHOCARDIOGRAM, TRANSESOPHAGEAL;  Surgeon: Isabell Manzanilla, DO;  Location: [facility];  Service: Cardiovascular;  Laterality: N/A;   TONSILLECTOMY AND  ADENOIDECTOMY  1960     Medications Prior to Admission: Prior to Admission medications   Medication Sig Start Date End Date Taking? Authorizing Provider  apixaban  (ELIQUIS ) 5 MG TABS tablet Take 1 tablet (5 mg total) by mouth 2 (two) times daily. 03/03/24 04/03/24 Yes Alphonsus Jeans, MD  Cholecalciferol  (VITAMIN D -3) 1000 UNITS CAPS Take 2,000 Units by mouth daily.   Yes [provider]  empagliflozin  (JARDIANCE ) 10 MG TABS tablet Take 1 tablet (10 mg total) by mouth daily. 03/04/24 04/03/24 Yes Alphonsus Jeans, MD  furosemide  (LASIX ) 20 MG tablet Take 1 tablet (20 mg total) by mouth daily. 03/04/24 04/03/24 Yes Alphonsus Jeans, MD  metoprolol  tartrate (LOPRESSOR ) 25 MG tablet Take 1 tablet (25 mg total) by mouth 4 (four) times daily. 03/03/24 04/03/24 Yes Alphonsus Jeans, MD  senna (SENOKOT) 8.6 MG TABS tablet Take 1 tablet (8.6 mg total) by mouth daily as needed for mild constipation. 12/12/21  Yes Noble Bateman, PA     Allergies:    Allergies  Allergen Reactions   Gabapentin     Balance issues   Ibuprofen Other (See Comments)    hypertension    Social History:   Social History   Socioeconomic History   Marital status: Widowed    Spouse name: Not on file   Number of children: Not on file   Years of education: Not on file   Highest education level: Not on file  Occupational History   Not  on file  Tobacco Use   Smoking status: Never   Smokeless tobacco: Never  Vaping Use   Vaping status: Never Used  Substance and Sexual Activity   Alcohol  use: No    Alcohol /week: 0.0 standard drinks of alcohol    Drug use: No   Sexual activity: Never    Partners: Male  Other Topics Concern   Not on file  Social History Narrative   Widowed   Retired   Children 3   Pets: none   Caffeine- Coffee, Tea, rare soda   Social Drivers of Corporate investment banker Strain: Low Risk  (12/07/2023)   Received from [facility]   Overall Financial  Resource Strain (CARDIA)    Difficulty of Paying Living Expenses: Not hard at all  Food Insecurity: No Food Insecurity (03/06/2024)   Hunger Vital Sign    Worried About Running Out of Food in the Last Year: Never true    Ran Out of Food in the Last Year: Never true  Transportation Needs: No Transportation Needs (03/06/2024)   PRAPARE - Administrator, Civil Service (Medical): No    Lack of Transportation (Non-Medical): No  Physical Activity: Inactive (03/11/2023)   Exercise Vital Sign    Days of Exercise per Week: 0 days    Minutes of Exercise per Session: 0 min  Stress: No Stress Concern Present (03/11/2023)   [employer] of Occupational Health - Occupational Stress Questionnaire    Feeling of Stress : Not at all  Social Connections: Moderately Integrated (02/29/2024)   Social Connection and Isolation Panel    Frequency of Communication with Friends and Family: More than three times a week    Frequency of Social Gatherings with Friends and Family: Twice a week    Attends Religious Services: More than 4 times per year    Active Member of [employer] or Organizations: Yes    Attends Banker Meetings: More than 4 times per year    Marital Status: Widowed  Intimate Partner Violence: Not At Risk (03/06/2024)   Humiliation, Afraid, Rape, and Kick questionnaire    Fear of Current or Ex-Partner: No    Emotionally Abused: No    Physically Abused: No    Sexually Abused: No     Family History:   The patient's family history includes Arthritis in her father, maternal grandmother, and mother; Breast cancer in her cousin, cousin, and maternal aunt; Cancer in her brother and maternal aunt; Hyperlipidemia in her son and son; Hypertension in her father and mother.    ROS:  Please see the history of present illness.  All other ROS reviewed and negative.     Physical Exam/Data:   Vitals:   04/03/24 1230  BP: (!) 141/95  Pulse: (!) 126  Resp: (!) 23  Temp: 97.7 F (36.5  C)  TempSrc: Oral  SpO2: 99%   No intake or output data in the 24 hours ending 04/03/24 1257    03/22/2024   10:24 AM 03/15/2024   10:36 AM 03/06/2024   10:25 AM  Last 3 Weights  Weight (lbs) 135 lb 131 lb 133 lb  Weight (kg) 61.236 kg 59.421 kg 60.328 kg     There is no height or weight on file to calculate BMI.  Wt Readings from Last 3 Encounters:  03/22/24 61.2 kg  03/15/24 59.4 kg  03/06/24 60.3 kg    Physical Exam: General: no acute distress. Head: Normocephalic, atraumatic  Neck: supple Lungs: nl  effort Heart: tachy, irreg irreg Abdomen: Soft Msk:  Strength and tone appear normal for age. Extremities: warm, dry Neuro: awake and alert Psych:  Responds to questions appropriately with a normal affect.    Laboratory Data:  ChemistryNo results for input(s): NA, K, CL, CO2, GLUCOSE, BUN, CREATININE, CALCIUM, GFRNONAA, GFRAA, ANIONGAP in the last 168 hours.  No results for input(s): PROT, ALBUMIN, AST, ALT, ALKPHOS, BILITOT in the last 168 hours. HematologyNo results for input(s): WBC, RBC, HGB, HCT, MCV, MCH, MCHC, RDW, PLT in the last 168 hours.  Assessment and Plan   Will proceed w/ TEE +/- DCCV  ASA III  Anesthesia to be administered by CRNA  Signed, Dorita Garter, MD 04/03/2024, 12:57 PM

## 2024-04-03 NOTE — Progress Notes (Signed)
 Transesophageal echocardiogram preliminary report  AVICE FUNCHESS [id number] 1941/09/03  Preliminary diagnosis H/o LAA thrombus, AF  Postprocedural diagnosis same  Time out A timeout was performed by the nursing staff and physicians specifically identifying the procedure performed, identification of the patient, the type of sedation, all allergies and medications, all pertinent medical history, and presedation assessment of nasopharynx.  The patient and or family understand the risks of the procedure including the rare risks of death, stroke, heart attack, esophogeal perforation, sore throat, and reaction to medications given.  General anesthesia CRNA administered GA. See MAR. 100 mg propofol . The patient had continued monitoring of heart rate, oxygenation, blood pressure, respiratory rate, and extent of signs of sedation throughout the entire procedure.  The patient received anesthesia over a period of 20 minutes.  CRNA, nursing staff and I were present during the procedure when the patient was anesthetized for 100% of the time.  Treatment considerations Findings suspicious for LAA thrombus  For further details of transesophageal echocardiogram please refer to final report.  Hortense Lyons [facility] MD M[facility] 04/03/2024 2:00 PM

## 2024-04-03 NOTE — Transfer of Care (Signed)
 Immediate Anesthesia Transfer of Care Note  Patient: Jane Willis  Procedure(s) Performed: ECHOCARDIOGRAM, TRANSESOPHAGEAL CARDIOVERSION  Patient Location: Cath Lab  Anesthesia Type:General  Level of Consciousness: drowsy  Airway & Oxygen  Therapy: Patient Spontanous Breathing and Patient connected to nasal cannula oxygen   Post-op Assessment: Report given to RN  Post vital signs: stable  Last Vitals:  Vitals Value Taken Time  BP 101/76 04/03/24 13:25  Temp    Pulse 89 04/03/24 13:30  Resp 18 04/03/24 13:30  SpO2 99 % 04/03/24 13:30    Last Pain:  Vitals:   04/03/24 1230  TempSrc: Oral  PainSc: 0-No pain         Complications: No notable events documented.

## 2024-04-03 NOTE — Progress Notes (Signed)
*  PRELIMINARY RESULTS* Echocardiogram Echocardiogram Transesophageal has been performed.  Jane Willis 04/03/2024, 1:38 PM

## 2024-04-04 ENCOUNTER — Encounter: Payer: Self-pay | Admitting: Cardiovascular Disease

## 2024-04-07 ENCOUNTER — Ambulatory Visit

## 2024-04-20 ENCOUNTER — Encounter

## 2024-04-20 ENCOUNTER — Encounter: Payer: Medicare Other | Admitting: Family Medicine

## 2024-05-02 ENCOUNTER — Encounter: Admitting: Nurse Practitioner

## 2024-05-11 ENCOUNTER — Other Ambulatory Visit: Payer: Self-pay | Admitting: Internal Medicine

## 2024-05-11 DIAGNOSIS — Z1231 Encounter for screening mammogram for malignant neoplasm of breast: Secondary | ICD-10-CM

## 2024-05-26 ENCOUNTER — Ambulatory Visit
Admission: RE | Admit: 2024-05-26 | Discharge: 2024-05-26 | Disposition: A | Discharge status: Home / Self Care | Source: Ambulatory Visit | Attending: Internal Medicine | Admitting: Internal Medicine

## 2024-05-26 DIAGNOSIS — Z1231 Encounter for screening mammogram for malignant neoplasm of breast: Secondary | ICD-10-CM | POA: Diagnosis present

## 2024-06-27 ENCOUNTER — Ambulatory Visit: Admitting: Anesthesiology

## 2024-06-27 ENCOUNTER — Encounter
Admission: RE | Disposition: A | Discharge status: Home / Self Care | Payer: Self-pay | Source: Home / Self Care | Attending: Cardiology

## 2024-06-27 ENCOUNTER — Ambulatory Visit
Admission: RE | Admit: 2024-06-27 | Discharge: 2024-06-27 | Disposition: A | Discharge status: Home / Self Care | Attending: Cardiology | Admitting: Cardiology

## 2024-06-27 ENCOUNTER — Encounter: Payer: Self-pay | Admitting: Cardiology

## 2024-06-27 ENCOUNTER — Ambulatory Visit
Admission: RE | Admit: 2024-06-27 | Discharge: 2024-06-27 | Disposition: A | Discharge status: Home / Self Care | Source: Ambulatory Visit | Attending: Student | Admitting: Student

## 2024-06-27 ENCOUNTER — Other Ambulatory Visit: Payer: Self-pay

## 2024-06-27 DIAGNOSIS — Z79899 Other long term (current) drug therapy: Secondary | ICD-10-CM | POA: Diagnosis not present

## 2024-06-27 DIAGNOSIS — I4819 Other persistent atrial fibrillation: Secondary | ICD-10-CM | POA: Insufficient documentation

## 2024-06-27 DIAGNOSIS — I4891 Unspecified atrial fibrillation: Secondary | ICD-10-CM | POA: Diagnosis present

## 2024-06-27 DIAGNOSIS — I5032 Chronic diastolic (congestive) heart failure: Secondary | ICD-10-CM | POA: Diagnosis not present

## 2024-06-27 DIAGNOSIS — I110 Hypertensive heart disease with heart failure: Secondary | ICD-10-CM | POA: Insufficient documentation

## 2024-06-27 HISTORY — PX: CARDIOVERSION: SHX1299

## 2024-06-27 HISTORY — PX: TEE WITHOUT CARDIOVERSION: SHX5443

## 2024-06-27 SURGERY — ECHOCARDIOGRAM, TRANSESOPHAGEAL
Anesthesia: General

## 2024-06-27 MED ORDER — SODIUM CHLORIDE 0.9 % IV SOLN
INTRAVENOUS | Status: DC
  Administered 2024-06-27: 250 mL via INTRAVENOUS

## 2024-06-27 MED ORDER — ACETAMINOPHEN 10 MG/ML IV SOLN: 1000.0000 mg | Freq: Once | INTRAVENOUS | Status: DC | PRN

## 2024-06-27 MED ORDER — FENTANYL CITRATE (PF) 100 MCG/2ML IJ SOLN: 25.0000 ug | INTRAMUSCULAR | Status: DC | PRN

## 2024-06-27 MED ORDER — PROPOFOL 10 MG/ML IV BOLUS
INTRAVENOUS | Status: DC | PRN
  Administered 2024-06-27: 20 mg via INTRAVENOUS
  Administered 2024-06-27: 40 mg via INTRAVENOUS
  Administered 2024-06-27: 10 mg via INTRAVENOUS

## 2024-06-27 MED ORDER — OXYCODONE HCL 5 MG/5ML PO SOLN: 5.0000 mg | Freq: Once | ORAL | Status: DC | PRN

## 2024-06-27 MED ORDER — OXYCODONE HCL 5 MG PO TABS: 5.0000 mg | ORAL_TABLET | Freq: Once | ORAL | Status: DC | PRN

## 2024-06-27 MED ORDER — LIDOCAINE VISCOUS HCL 2 % MT SOLN
OROMUCOSAL | Status: AC
  Filled 2024-06-27: qty 15

## 2024-06-27 MED ORDER — BUTAMBEN-TETRACAINE-BENZOCAINE 2-2-14 % EX AERO
INHALATION_SPRAY | CUTANEOUS | Status: AC
  Filled 2024-06-27: qty 5

## 2024-06-27 MED ORDER — PROPOFOL 10 MG/ML IV BOLUS
INTRAVENOUS | Status: AC
  Filled 2024-06-27: qty 40

## 2024-06-27 MED ORDER — DROPERIDOL 2.5 MG/ML IJ SOLN: 0.6250 mg | Freq: Once | INTRAMUSCULAR | Status: DC | PRN

## 2024-06-27 NOTE — Procedures (Signed)
 Electrical Cardioversion Procedure Note  Indication: Atrial Fibrillation  Procedure Details: Consent: Indication, Risk/benefits of procedure as well as the alternatives explained to patient and informed consent obtained. Time out performed. Verified patient identification, verified procedure, verified correct patient position, special equipment/implants available, medications/allergies/relevent history reviewed, required imaging and test results reviewed.  Deep sedation was provided by anesthesia with propofol . Patient was delivered with 200 Joules of electricity X 1 with success to Sinus rhythm. Patient tolerated the procedure well. No immediate complication noted.   Successful cardioversion  Keller Paterson, MD [facility] Cardiology- [facility]

## 2024-06-27 NOTE — Anesthesia Preprocedure Evaluation (Addendum)
 Anesthesia Evaluation  Patient identified by MRN, date of birth, ID band Patient awake    Reviewed: Allergy & Precautions, H&P , NPO status , Patient's Chart, lab work & pertinent test results, reviewed documented beta blocker date and time   History of Anesthesia Complications (+) history of anesthetic complications  Airway Mallampati: II   Neck ROM: full    Dental  (+) Poor Dentition   Pulmonary neg pulmonary ROS   Pulmonary exam normal        Cardiovascular Exercise Tolerance: Poor hypertension, On Medications +CHF (Heart failure with preserved LVEF)  + dysrhythmias Atrial Fibrillation  Rhythm:Irregular Rate:Normal     Neuro/Psych  Headaches  negative psych ROS   GI/Hepatic Neg liver ROS,GERD  ,,  Endo/Other  negative endocrine ROS    Renal/GU negative Renal ROS  negative genitourinary   Musculoskeletal   Abdominal Normal abdominal exam  (+)   Peds  Hematology negative hematology ROS (+)   Anesthesia Other Findings Past Medical History: No date: Allergy     Comment:  Seasonal No date: Arthritis No date: Chicken pox No date: Hyperlipidemia No date: Hypertension No date: PONV (postoperative nausea and vomiting) No date: Urinary incontinence Past Surgical History: 1986: ABDOMINAL HYSTERECTOMY 12/10/2021: ANTERIOR LATERAL LUMBAR FUSION WITH PERCUTANEOUS SCREW 1  LEVEL; N/A     Comment:  Procedure: L4-5 LATERAL INTERBODY FUSION WITH POSTERIOR               PERCUTANEOUS FIXATION;  Surgeon: Clois Fret, MD;               Location: [facility];  Service: Neurosurgery;  Laterality:               N/A; 12/10/2021: APPLICATION OF INTRAOPERATIVE CT SCAN; N/A     Comment:  Procedure: APPLICATION OF INTRAOPERATIVE CT SCAN;                Surgeon: Clois Fret, MD;  Location: [facility];                Service: Neurosurgery;  Laterality: N/A; 1970's: BREAST EXCISIONAL BIOPSY; Left     Comment:  benign 1968:  BREAST SURGERY 03/01/2024: CARDIOVERSION; N/A     Comment:  Procedure: CARDIOVERSION;  Surgeon: Dewane Shiner,               DO;  Location: [facility];  Service: Cardiovascular;                Laterality: N/A; 04/03/2024: CARDIOVERSION; N/A     Comment:  Procedure: CARDIOVERSION;  Surgeon: Hilarie Rocher, MD;              Location: [facility];  Service: Cardiovascular;                Laterality: N/A; 2014/2015: CATARACT EXTRACTION     Comment:  Both eyes No date: EYE SURGERY 01/2009: FRACTURE SURGERY     Comment:  Fractured Left ankle, plate on outside of ankle 2 rods               through/across ankle from inside of ankle 06/04/2023: KNEE ARTHROPLASTY; Left     Comment:  Procedure: COMPUTER ASSISTED TOTAL KNEE ARTHROPLASTY;                Surgeon: Mardee Lynwood SQUIBB, MD;  Location: [facility];                Service: Orthopedics;  Laterality: Left; No date: OOPHORECTOMY 03/01/2024: TEE WITHOUT CARDIOVERSION; N/A  Comment:  Procedure: ECHOCARDIOGRAM, TRANSESOPHAGEAL;  Surgeon:               Dewane Shiner, DO;  Location: [facility];  Service:               Cardiovascular;  Laterality: N/A; 04/03/2024: TEE WITHOUT CARDIOVERSION; N/A     Comment:  Procedure: ECHOCARDIOGRAM, TRANSESOPHAGEAL;  Surgeon:               Hilarie Rocher, MD;  Location: [facility];  Service:               Cardiovascular;  Laterality: N/A; 1960: TONSILLECTOMY AND ADENOIDECTOMY   Reproductive/Obstetrics negative OB ROS                              Anesthesia Physical Anesthesia Plan  ASA: 3  Anesthesia Plan: General   Post-op Pain Management: Minimal or no pain anticipated   Induction: Intravenous  PONV Risk Score and Plan:   Airway Management Planned: Natural Airway  Additional Equipment:   Intra-op Plan:   Post-operative Plan:   Informed Consent: I have reviewed the patients History and Physical, chart, labs and discussed the procedure including the risks, benefits and  alternatives for the proposed anesthesia with the patient or authorized representative who has indicated his/her understanding and acceptance.     Dental Advisory Given  Plan Discussed with: CRNA  Anesthesia Plan Comments:          Anesthesia Quick Evaluation

## 2024-06-27 NOTE — Transfer of Care (Signed)
 Immediate Anesthesia Transfer of Care Note  Patient: Jane Willis  Procedure(s) Performed: ECHOCARDIOGRAM, TRANSESOPHAGEAL CARDIOVERSION  Patient Location: PACU  Anesthesia Type:General  Level of Consciousness: drowsy and patient cooperative  Airway & Oxygen  Therapy: Patient Spontanous Breathing and Patient connected to nasal cannula oxygen   Post-op Assessment: Report given to RN and Post -op Vital signs reviewed and stable  Post vital signs: stable  Last Vitals:  Vitals Value Taken Time  BP 108/71 1241  Temp    Pulse 63 1241  Resp 14 1241  SpO2 97% 2L [place] 1241    Last Pain:  Vitals:   06/27/24 1140  TempSrc: Oral  PainSc: 0-No pain         Complications: No notable events documented.

## 2024-06-27 NOTE — Anesthesia Postprocedure Evaluation (Signed)
 Anesthesia Post Note  Patient: Jane Willis  Procedure(s) Performed: ECHOCARDIOGRAM, TRANSESOPHAGEAL CARDIOVERSION  Patient location during evaluation: PACU Anesthesia Type: General Level of consciousness: awake and alert Pain management: pain level controlled Vital Signs Assessment: post-procedure vital signs reviewed and stable Respiratory status: spontaneous breathing, nonlabored ventilation and respiratory function stable Cardiovascular status: blood pressure returned to baseline and stable Postop Assessment: no apparent nausea or vomiting Anesthetic complications: no   No notable events documented.   Last Vitals:  Vitals:   06/27/24 1315 06/27/24 1330  BP: (!) 122/110 128/88  Pulse: (!) 50 (!) 49  Resp: (!) 24 (!) 22  Temp:    SpO2: 96% 100%    Last Pain:  Vitals:   06/27/24 1330  TempSrc:   PainSc: 0-No pain                 Camellia Merilee Louder

## 2024-06-28 ENCOUNTER — Encounter: Payer: Self-pay | Admitting: Cardiology

## 2024-06-28 LAB — ECHO TEE

## 2024-10-31 ENCOUNTER — Emergency Department
Admission: EM | Admit: 2024-10-31 | Discharge: 2024-10-31 | Disposition: A | Discharge status: Home / Self Care | Attending: Emergency Medicine | Admitting: Emergency Medicine

## 2024-10-31 ENCOUNTER — Emergency Department

## 2024-10-31 DIAGNOSIS — S01512A Laceration without foreign body of oral cavity, initial encounter: Secondary | ICD-10-CM

## 2024-10-31 DIAGNOSIS — S0990XA Unspecified injury of head, initial encounter: Secondary | ICD-10-CM | POA: Insufficient documentation

## 2024-10-31 DIAGNOSIS — S0083XA Contusion of other part of head, initial encounter: Secondary | ICD-10-CM

## 2024-10-31 DIAGNOSIS — Z7901 Long term (current) use of anticoagulants: Secondary | ICD-10-CM | POA: Diagnosis not present

## 2024-10-31 DIAGNOSIS — S025XXA Fracture of tooth (traumatic), initial encounter for closed fracture: Secondary | ICD-10-CM | POA: Diagnosis not present

## 2024-10-31 DIAGNOSIS — S0993XA Unspecified injury of face, initial encounter: Secondary | ICD-10-CM | POA: Diagnosis present

## 2024-10-31 DIAGNOSIS — I10 Essential (primary) hypertension: Secondary | ICD-10-CM | POA: Insufficient documentation

## 2024-10-31 DIAGNOSIS — I6782 Cerebral ischemia: Secondary | ICD-10-CM | POA: Diagnosis not present

## 2024-10-31 DIAGNOSIS — W19XXXA Unspecified fall, initial encounter: Secondary | ICD-10-CM

## 2024-10-31 DIAGNOSIS — M25562 Pain in left knee: Secondary | ICD-10-CM | POA: Diagnosis not present

## 2024-10-31 DIAGNOSIS — W06XXXA Fall from bed, initial encounter: Secondary | ICD-10-CM | POA: Diagnosis not present

## 2024-10-31 DIAGNOSIS — S00511A Abrasion of lip, initial encounter: Secondary | ICD-10-CM | POA: Diagnosis not present

## 2024-10-31 DIAGNOSIS — I4891 Unspecified atrial fibrillation: Secondary | ICD-10-CM | POA: Diagnosis not present

## 2024-10-31 MED ORDER — AMOXICILLIN-POT CLAVULANATE 875-125 MG PO TABS: 1.0000 | ORAL_TABLET | Freq: Two times a day (BID) | ORAL | 0 refills | Status: AC

## 2024-10-31 MED ORDER — BACITRACIN ZINC 500 UNIT/GM EX OINT
TOPICAL_OINTMENT | Freq: Once | CUTANEOUS | Status: AC
  Administered 2024-10-31: 1 via TOPICAL
  Filled 2024-10-31: qty 0.9

## 2024-10-31 NOTE — ED Triage Notes (Signed)
 ARrives from [facility] for ED evaluation. Patient fell this morning, hitting face. Laceration to bridge of nose and lip.  On Thinners. No LOC.

## 2024-10-31 NOTE — Progress Notes (Signed)
 83 Y/O ON COUMADIN  , FELL AND HIT FACE ON FLOOR. NO APPARENT LOC.  SENT TO ED FOR EVAL

## 2024-10-31 NOTE — ED Provider Notes (Signed)
 "   [facility] Emergency Department Provider Note     Event Date/Time   First MD Initiated Contact with Patient 10/31/24 1538     (approximate)   History   Fall   HPI  Jane Willis is a 84 y.o. female with a history of arthritis, HTN, HLD, A-fib on blood thinner, presents to the ED for injuries following mechanical fall.  Patient presents from [facility], where she endorses a mechanical fall this morning out of bed, she reportedly hit her head.  She denies any LOC, but experienced a nosebleed following the incident.  She presents to the ED with nose bleeding now subsided, but endorses some resolve dizziness after the fall.  Patient is amatory at this time endorsing some left knee pain.  She denies any, click, lock, or give way to the knee.  Physical Exam   Triage Vital Signs: ED Triage Vitals  Encounter Vitals Group     BP 10/31/24 1402 (!) 162/104     Girls Systolic BP Percentile --      Girls Diastolic BP Percentile --      Boys Systolic BP Percentile --      Boys Diastolic BP Percentile --      Pulse Rate 10/31/24 1402 (!) 58     Resp 10/31/24 1402 18     Temp 10/31/24 1402 97.8 F (36.6 C)     Temp Source 10/31/24 1402 Oral     SpO2 10/31/24 1402 99 %     Weight 10/31/24 1404 138 lb (62.6 kg)     Height 10/31/24 1404 5' 3 (1.6 m)     Head Circumference --      Peak Flow --      Pain Score 10/31/24 1402 7     Pain Loc --      Pain Education --      Exclude from Growth Chart --     Most recent vital signs: Vitals:   10/31/24 1402  BP: (!) 162/104  Pulse: (!) 58  Resp: 18  Temp: 97.8 F (36.6 C)  SpO2: 99%    General Awake, no distress. A&O x 4 HEENT NCAT. PERRL. EOMI. No rhinorrhea. Mucous membranes are moist.  CV:  Good peripheral perfusion. No CCE distally RESP:  Normal effort.  ABD:  No distention.  MSK:  ****   ED Results / Procedures / Treatments   Labs (all labs ordered are listed, but only abnormal results are  displayed) Labs Reviewed - No data to display   EKG   RADIOLOGY  I personally viewed and evaluated these images as part of my medical decision making, as well as reviewing the written report by the radiologist.  ED Provider Interpretation: ***  No results found.   PROCEDURES:  Critical Care performed: No  Procedures   MEDICATIONS ORDERED IN ED: Medications - No data to display   IMPRESSION / MDM / ASSESSMENT AND PLAN / ED COURSE  I reviewed the triage vital signs and the nursing notes.                              Differential diagnosis includes, but is not limited to, contusion, nosebleed, facial fracture, SDH, hematoma, abrasion, cervical radiculopathy, cervical fracture  Patient's presentation is most consistent with acute complicated illness / injury requiring diagnostic workup.  Patient's diagnosis is consistent with ***. Patient will be discharged home with prescriptions for ***. Patient is  to follow up with *** as needed or otherwise directed. Patient is given ED precautions to return to the ED for any worsening or new symptoms.     FINAL CLINICAL IMPRESSION(S) / ED DIAGNOSES   Final diagnoses:  Fall in home, initial encounter  Contusion of face, initial encounter     Rx / DC Orders   ED Discharge Orders     None        Note:  This document was prepared using Dragon voice recognition software and may include unintentional dictation errors.  "

## 2024-10-31 NOTE — ED Triage Notes (Signed)
 Pt presents to the ED via POV from [facility]. Pt reports a fall this morning hitting her face. Denies LOC. Reports taking a blood thinner. Reports that she had a nose bleed after the fall. Bleeding has since subsided. Pt denies dizziness after the fall. Pt ambulatory after the fall. Reports left knee pain. Full movement present in knee. Pt did break a tooth during the fall.Denies neck pain.

## 2024-10-31 NOTE — Discharge Instructions (Signed)
 Your exam and CT scans are normal and reassuring following your fall.  No signs of a facial or nasal bone fracture, no dental injury, no bleeding on the brain, and no cervical spine fracture.  You can decide to be somewhat stiff in the next few days.  You are being treated with a course of antibiotics for your self-inflicted upper lip laceration.  Keep the outside wound clean, dry, and covered with a thin veil of antibiotic ointment.
